# Patient Record
Sex: Male | Born: 1935
Health system: Southern US, Community
[De-identification: ages and names within clinical notes are randomized; demographics above are authoritative.]

## PROBLEM LIST (undated history)

## (undated) DIAGNOSIS — D696 Thrombocytopenia, unspecified: Secondary | ICD-10-CM

## (undated) DIAGNOSIS — C61 Malignant neoplasm of prostate: Secondary | ICD-10-CM

## (undated) DIAGNOSIS — J189 Pneumonia, unspecified organism: Secondary | ICD-10-CM

## (undated) DIAGNOSIS — R011 Cardiac murmur, unspecified: Secondary | ICD-10-CM

## (undated) DIAGNOSIS — I499 Cardiac arrhythmia, unspecified: Secondary | ICD-10-CM

## (undated) DIAGNOSIS — R001 Bradycardia, unspecified: Secondary | ICD-10-CM

## (undated) DIAGNOSIS — R799 Abnormal finding of blood chemistry, unspecified: Secondary | ICD-10-CM

## (undated) DIAGNOSIS — C439 Malignant melanoma of skin, unspecified: Secondary | ICD-10-CM

## (undated) HISTORY — DX: Cardiac murmur, unspecified: R01.1

## (undated) HISTORY — PX: CATARACT EXTRACTION: SUR2

## (undated) HISTORY — PX: BACK SURGERY: SHX140

## (undated) HISTORY — PX: KNEE SURGERY: SHX244

## (undated) HISTORY — PX: MELANOMA EXCISION: SHX5266

## (undated) HISTORY — DX: Abnormal finding of blood chemistry, unspecified: R79.9

---

## 2006-12-25 ENCOUNTER — Encounter: Admission: RE | Admit: 2006-12-25 | Discharge: 2006-12-25 | Payer: Self-pay | Admitting: Family Medicine

## 2008-01-23 ENCOUNTER — Encounter: Admission: RE | Admit: 2008-01-23 | Discharge: 2008-03-27 | Payer: Self-pay | Admitting: Specialist

## 2008-03-28 ENCOUNTER — Encounter: Admission: RE | Admit: 2008-03-28 | Discharge: 2008-06-26 | Payer: Self-pay | Admitting: Specialist

## 2009-07-20 ENCOUNTER — Ambulatory Visit (HOSPITAL_COMMUNITY): Admission: RE | Admit: 2009-07-20 | Discharge: 2009-07-20 | Payer: Self-pay | Admitting: Urology

## 2010-02-01 ENCOUNTER — Encounter
Admission: RE | Admit: 2010-02-01 | Discharge: 2010-03-25 | Payer: Self-pay | Source: Home / Self Care | Attending: Internal Medicine | Admitting: Internal Medicine

## 2011-04-20 DIAGNOSIS — R972 Elevated prostate specific antigen [PSA]: Secondary | ICD-10-CM | POA: Diagnosis not present

## 2011-04-20 DIAGNOSIS — C61 Malignant neoplasm of prostate: Secondary | ICD-10-CM | POA: Diagnosis not present

## 2011-04-20 DIAGNOSIS — N529 Male erectile dysfunction, unspecified: Secondary | ICD-10-CM | POA: Diagnosis not present

## 2011-04-20 DIAGNOSIS — N401 Enlarged prostate with lower urinary tract symptoms: Secondary | ICD-10-CM | POA: Diagnosis not present

## 2011-08-02 DIAGNOSIS — M545 Low back pain: Secondary | ICD-10-CM | POA: Diagnosis not present

## 2011-08-02 DIAGNOSIS — IMO0002 Reserved for concepts with insufficient information to code with codable children: Secondary | ICD-10-CM | POA: Diagnosis not present

## 2011-08-02 DIAGNOSIS — M48061 Spinal stenosis, lumbar region without neurogenic claudication: Secondary | ICD-10-CM | POA: Diagnosis not present

## 2011-08-18 DIAGNOSIS — IMO0002 Reserved for concepts with insufficient information to code with codable children: Secondary | ICD-10-CM | POA: Diagnosis not present

## 2011-08-18 DIAGNOSIS — M545 Low back pain: Secondary | ICD-10-CM | POA: Diagnosis not present

## 2011-08-18 DIAGNOSIS — M48061 Spinal stenosis, lumbar region without neurogenic claudication: Secondary | ICD-10-CM | POA: Diagnosis not present

## 2011-09-08 DIAGNOSIS — M545 Low back pain: Secondary | ICD-10-CM | POA: Diagnosis not present

## 2011-09-08 DIAGNOSIS — IMO0002 Reserved for concepts with insufficient information to code with codable children: Secondary | ICD-10-CM | POA: Diagnosis not present

## 2011-09-08 DIAGNOSIS — M48061 Spinal stenosis, lumbar region without neurogenic claudication: Secondary | ICD-10-CM | POA: Diagnosis not present

## 2011-09-13 DIAGNOSIS — E1365 Other specified diabetes mellitus with hyperglycemia: Secondary | ICD-10-CM | POA: Diagnosis not present

## 2011-09-13 DIAGNOSIS — I10 Essential (primary) hypertension: Secondary | ICD-10-CM | POA: Diagnosis not present

## 2011-09-16 DIAGNOSIS — M79609 Pain in unspecified limb: Secondary | ICD-10-CM | POA: Diagnosis not present

## 2011-09-16 DIAGNOSIS — R209 Unspecified disturbances of skin sensation: Secondary | ICD-10-CM | POA: Diagnosis not present

## 2011-09-21 DIAGNOSIS — IMO0002 Reserved for concepts with insufficient information to code with codable children: Secondary | ICD-10-CM | POA: Diagnosis not present

## 2011-09-21 DIAGNOSIS — M48061 Spinal stenosis, lumbar region without neurogenic claudication: Secondary | ICD-10-CM | POA: Diagnosis not present

## 2011-10-12 DIAGNOSIS — C61 Malignant neoplasm of prostate: Secondary | ICD-10-CM | POA: Diagnosis not present

## 2011-10-19 DIAGNOSIS — IMO0002 Reserved for concepts with insufficient information to code with codable children: Secondary | ICD-10-CM | POA: Diagnosis not present

## 2011-10-19 DIAGNOSIS — C61 Malignant neoplasm of prostate: Secondary | ICD-10-CM | POA: Diagnosis not present

## 2011-10-19 DIAGNOSIS — M48061 Spinal stenosis, lumbar region without neurogenic claudication: Secondary | ICD-10-CM | POA: Diagnosis not present

## 2011-10-19 DIAGNOSIS — R972 Elevated prostate specific antigen [PSA]: Secondary | ICD-10-CM | POA: Diagnosis not present

## 2011-10-19 DIAGNOSIS — N401 Enlarged prostate with lower urinary tract symptoms: Secondary | ICD-10-CM | POA: Diagnosis not present

## 2011-10-26 ENCOUNTER — Other Ambulatory Visit (HOSPITAL_COMMUNITY): Payer: Self-pay | Admitting: Specialist

## 2011-10-26 DIAGNOSIS — M545 Low back pain: Secondary | ICD-10-CM

## 2011-10-27 ENCOUNTER — Ambulatory Visit (HOSPITAL_COMMUNITY)
Admission: RE | Admit: 2011-10-27 | Discharge: 2011-10-27 | Disposition: A | Discharge status: Home / Self Care | Payer: Medicare Other | Source: Ambulatory Visit | Attending: Specialist | Admitting: Specialist

## 2011-10-27 DIAGNOSIS — M545 Low back pain, unspecified: Secondary | ICD-10-CM | POA: Insufficient documentation

## 2011-10-27 DIAGNOSIS — M79609 Pain in unspecified limb: Secondary | ICD-10-CM | POA: Diagnosis not present

## 2011-10-27 DIAGNOSIS — C61 Malignant neoplasm of prostate: Secondary | ICD-10-CM | POA: Insufficient documentation

## 2011-10-27 DIAGNOSIS — M48061 Spinal stenosis, lumbar region without neurogenic claudication: Secondary | ICD-10-CM | POA: Diagnosis not present

## 2011-10-28 ENCOUNTER — Other Ambulatory Visit (HOSPITAL_COMMUNITY): Payer: Self-pay

## 2011-11-08 DIAGNOSIS — M48061 Spinal stenosis, lumbar region without neurogenic claudication: Secondary | ICD-10-CM | POA: Diagnosis not present

## 2012-01-02 DIAGNOSIS — M48061 Spinal stenosis, lumbar region without neurogenic claudication: Secondary | ICD-10-CM | POA: Diagnosis not present

## 2012-01-02 DIAGNOSIS — M545 Low back pain: Secondary | ICD-10-CM | POA: Diagnosis not present

## 2012-01-02 DIAGNOSIS — IMO0002 Reserved for concepts with insufficient information to code with codable children: Secondary | ICD-10-CM | POA: Diagnosis not present

## 2012-02-28 DIAGNOSIS — W010XXA Fall on same level from slipping, tripping and stumbling without subsequent striking against object, initial encounter: Secondary | ICD-10-CM | POA: Diagnosis not present

## 2012-02-28 DIAGNOSIS — S61409A Unspecified open wound of unspecified hand, initial encounter: Secondary | ICD-10-CM | POA: Diagnosis not present

## 2012-03-05 DIAGNOSIS — Z23 Encounter for immunization: Secondary | ICD-10-CM | POA: Diagnosis not present

## 2012-03-05 DIAGNOSIS — S61409A Unspecified open wound of unspecified hand, initial encounter: Secondary | ICD-10-CM | POA: Diagnosis not present

## 2012-03-08 DIAGNOSIS — E1365 Other specified diabetes mellitus with hyperglycemia: Secondary | ICD-10-CM | POA: Diagnosis not present

## 2012-03-08 DIAGNOSIS — Z125 Encounter for screening for malignant neoplasm of prostate: Secondary | ICD-10-CM | POA: Diagnosis not present

## 2012-03-08 DIAGNOSIS — I10 Essential (primary) hypertension: Secondary | ICD-10-CM | POA: Diagnosis not present

## 2012-03-14 DIAGNOSIS — D126 Benign neoplasm of colon, unspecified: Secondary | ICD-10-CM | POA: Diagnosis not present

## 2012-03-14 DIAGNOSIS — I10 Essential (primary) hypertension: Secondary | ICD-10-CM | POA: Diagnosis not present

## 2012-03-14 DIAGNOSIS — E1365 Other specified diabetes mellitus with hyperglycemia: Secondary | ICD-10-CM | POA: Diagnosis not present

## 2012-03-14 DIAGNOSIS — C61 Malignant neoplasm of prostate: Secondary | ICD-10-CM | POA: Diagnosis not present

## 2012-04-04 DIAGNOSIS — M48061 Spinal stenosis, lumbar region without neurogenic claudication: Secondary | ICD-10-CM | POA: Diagnosis not present

## 2012-04-10 ENCOUNTER — Ambulatory Visit: Payer: Medicare Other | Attending: Specialist | Admitting: Physical Therapy

## 2012-04-10 DIAGNOSIS — Z96659 Presence of unspecified artificial knee joint: Secondary | ICD-10-CM | POA: Insufficient documentation

## 2012-04-10 DIAGNOSIS — IMO0001 Reserved for inherently not codable concepts without codable children: Secondary | ICD-10-CM | POA: Diagnosis not present

## 2012-04-10 DIAGNOSIS — Z9181 History of falling: Secondary | ICD-10-CM | POA: Diagnosis not present

## 2012-04-10 DIAGNOSIS — R5381 Other malaise: Secondary | ICD-10-CM | POA: Diagnosis not present

## 2012-04-11 ENCOUNTER — Ambulatory Visit: Payer: Medicare Other | Admitting: Physical Therapy

## 2012-04-11 DIAGNOSIS — IMO0001 Reserved for inherently not codable concepts without codable children: Secondary | ICD-10-CM | POA: Diagnosis not present

## 2012-04-11 DIAGNOSIS — Z96659 Presence of unspecified artificial knee joint: Secondary | ICD-10-CM | POA: Diagnosis not present

## 2012-04-11 DIAGNOSIS — Z9181 History of falling: Secondary | ICD-10-CM | POA: Diagnosis not present

## 2012-04-11 DIAGNOSIS — R5381 Other malaise: Secondary | ICD-10-CM | POA: Diagnosis not present

## 2012-04-16 ENCOUNTER — Ambulatory Visit: Payer: Medicare Other | Admitting: *Deleted

## 2012-04-16 DIAGNOSIS — R5381 Other malaise: Secondary | ICD-10-CM | POA: Diagnosis not present

## 2012-04-16 DIAGNOSIS — Z96659 Presence of unspecified artificial knee joint: Secondary | ICD-10-CM | POA: Diagnosis not present

## 2012-04-16 DIAGNOSIS — Z9181 History of falling: Secondary | ICD-10-CM | POA: Diagnosis not present

## 2012-04-16 DIAGNOSIS — IMO0001 Reserved for inherently not codable concepts without codable children: Secondary | ICD-10-CM | POA: Diagnosis not present

## 2012-04-18 ENCOUNTER — Ambulatory Visit: Payer: Medicare Other | Admitting: *Deleted

## 2012-04-18 DIAGNOSIS — Z96659 Presence of unspecified artificial knee joint: Secondary | ICD-10-CM | POA: Diagnosis not present

## 2012-04-18 DIAGNOSIS — C61 Malignant neoplasm of prostate: Secondary | ICD-10-CM | POA: Diagnosis not present

## 2012-04-18 DIAGNOSIS — R5381 Other malaise: Secondary | ICD-10-CM | POA: Diagnosis not present

## 2012-04-18 DIAGNOSIS — Z9181 History of falling: Secondary | ICD-10-CM | POA: Diagnosis not present

## 2012-04-18 DIAGNOSIS — IMO0001 Reserved for inherently not codable concepts without codable children: Secondary | ICD-10-CM | POA: Diagnosis not present

## 2012-04-23 ENCOUNTER — Ambulatory Visit: Payer: Medicare Other | Admitting: Physical Therapy

## 2012-04-23 DIAGNOSIS — Z9181 History of falling: Secondary | ICD-10-CM | POA: Diagnosis not present

## 2012-04-23 DIAGNOSIS — Z96659 Presence of unspecified artificial knee joint: Secondary | ICD-10-CM | POA: Diagnosis not present

## 2012-04-23 DIAGNOSIS — IMO0001 Reserved for inherently not codable concepts without codable children: Secondary | ICD-10-CM | POA: Diagnosis not present

## 2012-04-23 DIAGNOSIS — R5381 Other malaise: Secondary | ICD-10-CM | POA: Diagnosis not present

## 2012-04-25 ENCOUNTER — Encounter: Payer: Medicare Other | Admitting: Physical Therapy

## 2012-04-30 ENCOUNTER — Ambulatory Visit: Payer: Medicare Other | Attending: Specialist | Admitting: *Deleted

## 2012-04-30 DIAGNOSIS — R5381 Other malaise: Secondary | ICD-10-CM | POA: Diagnosis not present

## 2012-04-30 DIAGNOSIS — Z96659 Presence of unspecified artificial knee joint: Secondary | ICD-10-CM | POA: Diagnosis not present

## 2012-04-30 DIAGNOSIS — IMO0001 Reserved for inherently not codable concepts without codable children: Secondary | ICD-10-CM | POA: Diagnosis not present

## 2012-04-30 DIAGNOSIS — Z9181 History of falling: Secondary | ICD-10-CM | POA: Diagnosis not present

## 2012-05-02 ENCOUNTER — Ambulatory Visit: Payer: Medicare Other | Admitting: *Deleted

## 2012-05-07 ENCOUNTER — Ambulatory Visit: Payer: Medicare Other | Admitting: *Deleted

## 2012-05-07 DIAGNOSIS — N401 Enlarged prostate with lower urinary tract symptoms: Secondary | ICD-10-CM | POA: Diagnosis not present

## 2012-05-07 DIAGNOSIS — R972 Elevated prostate specific antigen [PSA]: Secondary | ICD-10-CM | POA: Diagnosis not present

## 2012-05-07 DIAGNOSIS — C61 Malignant neoplasm of prostate: Secondary | ICD-10-CM | POA: Diagnosis not present

## 2012-05-07 DIAGNOSIS — N529 Male erectile dysfunction, unspecified: Secondary | ICD-10-CM | POA: Diagnosis not present

## 2012-05-09 ENCOUNTER — Ambulatory Visit: Payer: Medicare Other | Admitting: *Deleted

## 2012-05-15 ENCOUNTER — Ambulatory Visit: Payer: Medicare Other | Admitting: *Deleted

## 2012-05-17 ENCOUNTER — Ambulatory Visit: Payer: Medicare Other | Admitting: *Deleted

## 2012-05-23 ENCOUNTER — Ambulatory Visit: Payer: Medicare Other | Admitting: *Deleted

## 2012-06-26 DIAGNOSIS — H524 Presbyopia: Secondary | ICD-10-CM | POA: Diagnosis not present

## 2012-06-26 DIAGNOSIS — H2510 Age-related nuclear cataract, unspecified eye: Secondary | ICD-10-CM | POA: Diagnosis not present

## 2012-06-26 DIAGNOSIS — H35369 Drusen (degenerative) of macula, unspecified eye: Secondary | ICD-10-CM | POA: Diagnosis not present

## 2012-06-26 DIAGNOSIS — H1045 Other chronic allergic conjunctivitis: Secondary | ICD-10-CM | POA: Diagnosis not present

## 2012-07-09 DIAGNOSIS — H353190 Nonexudative age-related macular degeneration, unspecified eye, stage unspecified: Secondary | ICD-10-CM | POA: Diagnosis not present

## 2012-07-09 DIAGNOSIS — H2510 Age-related nuclear cataract, unspecified eye: Secondary | ICD-10-CM | POA: Diagnosis not present

## 2012-07-09 DIAGNOSIS — H43819 Vitreous degeneration, unspecified eye: Secondary | ICD-10-CM | POA: Diagnosis not present

## 2012-07-09 DIAGNOSIS — H43399 Other vitreous opacities, unspecified eye: Secondary | ICD-10-CM | POA: Diagnosis not present

## 2012-08-01 DIAGNOSIS — M48061 Spinal stenosis, lumbar region without neurogenic claudication: Secondary | ICD-10-CM | POA: Diagnosis not present

## 2012-09-12 DIAGNOSIS — E1365 Other specified diabetes mellitus with hyperglycemia: Secondary | ICD-10-CM | POA: Diagnosis not present

## 2012-09-12 DIAGNOSIS — I10 Essential (primary) hypertension: Secondary | ICD-10-CM | POA: Diagnosis not present

## 2012-09-18 DIAGNOSIS — E1365 Other specified diabetes mellitus with hyperglycemia: Secondary | ICD-10-CM | POA: Diagnosis not present

## 2012-09-18 DIAGNOSIS — I10 Essential (primary) hypertension: Secondary | ICD-10-CM | POA: Diagnosis not present

## 2012-09-18 DIAGNOSIS — C61 Malignant neoplasm of prostate: Secondary | ICD-10-CM | POA: Diagnosis not present

## 2012-10-29 DIAGNOSIS — C61 Malignant neoplasm of prostate: Secondary | ICD-10-CM | POA: Diagnosis not present

## 2012-11-05 DIAGNOSIS — N401 Enlarged prostate with lower urinary tract symptoms: Secondary | ICD-10-CM | POA: Diagnosis not present

## 2012-11-05 DIAGNOSIS — C61 Malignant neoplasm of prostate: Secondary | ICD-10-CM | POA: Diagnosis not present

## 2012-11-05 DIAGNOSIS — R972 Elevated prostate specific antigen [PSA]: Secondary | ICD-10-CM | POA: Diagnosis not present

## 2013-01-03 DIAGNOSIS — Z23 Encounter for immunization: Secondary | ICD-10-CM | POA: Diagnosis not present

## 2013-02-04 DIAGNOSIS — C61 Malignant neoplasm of prostate: Secondary | ICD-10-CM | POA: Diagnosis not present

## 2013-03-03 ENCOUNTER — Encounter (HOSPITAL_COMMUNITY): Payer: Self-pay | Admitting: Emergency Medicine

## 2013-03-03 ENCOUNTER — Emergency Department (HOSPITAL_COMMUNITY)
Admission: EM | Admit: 2013-03-03 | Discharge: 2013-03-03 | Disposition: A | Discharge status: Home / Self Care | Payer: Medicare Other | Attending: Emergency Medicine | Admitting: Emergency Medicine

## 2013-03-03 DIAGNOSIS — Z79899 Other long term (current) drug therapy: Secondary | ICD-10-CM | POA: Insufficient documentation

## 2013-03-03 DIAGNOSIS — R42 Dizziness and giddiness: Secondary | ICD-10-CM | POA: Insufficient documentation

## 2013-03-03 DIAGNOSIS — Z85820 Personal history of malignant melanoma of skin: Secondary | ICD-10-CM | POA: Insufficient documentation

## 2013-03-03 DIAGNOSIS — I498 Other specified cardiac arrhythmias: Secondary | ICD-10-CM | POA: Diagnosis not present

## 2013-03-03 DIAGNOSIS — Z7982 Long term (current) use of aspirin: Secondary | ICD-10-CM | POA: Diagnosis not present

## 2013-03-03 DIAGNOSIS — M542 Cervicalgia: Secondary | ICD-10-CM | POA: Insufficient documentation

## 2013-03-03 DIAGNOSIS — J3489 Other specified disorders of nose and nasal sinuses: Secondary | ICD-10-CM | POA: Insufficient documentation

## 2013-03-03 HISTORY — DX: Cardiac arrhythmia, unspecified: I49.9

## 2013-03-03 HISTORY — DX: Malignant melanoma of skin, unspecified: C43.9

## 2013-03-03 LAB — CBC WITH DIFFERENTIAL/PLATELET
Basophils Absolute: 0 10*3/uL (ref 0.0–0.1)
Eosinophils Relative: 1 % (ref 0–5)
Lymphocytes Relative: 20 % (ref 12–46)
MCHC: 33.7 g/dL (ref 30.0–36.0)
Neutro Abs: 4.6 10*3/uL (ref 1.7–7.7)
Neutrophils Relative %: 71 % (ref 43–77)
RBC: 3.93 MIL/uL — ABNORMAL LOW (ref 4.22–5.81)
WBC: 6.5 10*3/uL (ref 4.0–10.5)

## 2013-03-03 LAB — COMPREHENSIVE METABOLIC PANEL
ALT: 22 U/L (ref 0–53)
Albumin: 3.8 g/dL (ref 3.5–5.2)
Chloride: 101 mEq/L (ref 96–112)
Glucose, Bld: 113 mg/dL — ABNORMAL HIGH (ref 70–99)
Potassium: 4.3 mEq/L (ref 3.5–5.1)
Total Protein: 7.2 g/dL (ref 6.0–8.3)

## 2013-03-03 LAB — URINALYSIS, ROUTINE W REFLEX MICROSCOPIC
Bilirubin Urine: NEGATIVE
Glucose, UA: NEGATIVE mg/dL
Nitrite: NEGATIVE
Protein, ur: NEGATIVE mg/dL
Specific Gravity, Urine: 1.013 (ref 1.005–1.030)

## 2013-03-03 LAB — POCT I-STAT TROPONIN I

## 2013-03-03 MED ORDER — SODIUM CHLORIDE 0.9 % IV SOLN
Freq: Once | INTRAVENOUS | Status: AC
  Administered 2013-03-03: 1000 mL via INTRAVENOUS

## 2013-03-03 NOTE — ED Notes (Signed)
[facility] Medical Records called back. Stated that they have no medical records for the patient.

## 2013-03-03 NOTE — ED Provider Notes (Signed)
79 year old male, states that he did not have his normal morning coffee this morning, went to church and while he was at church she started to feel lightheaded and nauseated, placed his head between his knees and felt immediately better but when he sat up again it came back. This is happened several times throughout the morning, he denies any chest pain, shortness of breath, changes in vision, numbness, weakness, ataxia. At this time his symptoms have essentially resolved. His EKG shows no acute findings, lab work is normal, troponin is normal. He will have a second troponin prior to being discharged home. The patient has no focal neurologic deficits to suggest a stroke, no cardiac abnormalities to suggest acute ischemia or arrhythmia. Cardiac monitoring has been performed without any signs of concern.  Medical screening examination/treatment/procedure(s) were conducted as a shared visit with non-physician practitioner(s) and myself.  I personally evaluated the patient during the encounter.  Clinical Impression: light headed      Vida Roller, MD 03/05/13 [phone]

## 2013-03-03 NOTE — ED Notes (Addendum)
Patient presents to ED via EMS from Urgent Care in [place] for dizziness at church today.

## 2013-03-03 NOTE — ED Provider Notes (Signed)
CSN: [id number]     Arrival date & time 03/03/13  1414 History   First MD Initiated Contact with Patient 03/03/13 1502     Chief Complaint  Patient presents with  . Dizziness   (Consider location/radiation/quality/duration/timing/severity/associated sxs/prior Treatment) The history is provided by the patient.   Pt reports that for several years he has had occasional episodes of lightheadedness that resolve when he puts his head between his legs.  States his blood pressure usually measures low during these episodes ("75/50").  Has seen Dr Jacinto Halim for same with no known diagnosis. Today he was sitting in church (around 11:30am) and had an episode that was worse than prior episodes and associated with nausea.  States he felt better when looking towards dark areas and with his head between his legs, worse with standing up.  Significant other states he was jerking all over.  He had to be assisted while walking.  Taken to urgent care where he was found to be bradycardic in low 50s, states his normal HR is in the 70s.  Pt has also noted soreness in the back of his neck today, which he thinks is from moving furniture yesterday.  Pt has been on medications for high blood pressure including lisinopril but states he is only taking multivitamins at this time.  Denies recent illness, chest pain, SOB, leg swelling, fever/chills, new or change in medications recently.  States he is eating and drinking well.   Past Medical History  Diagnosis Date  . Irregular heart rate   . Melanoma    Past Surgical History  Procedure Laterality Date  . Back surgery    . Knee surgery     History reviewed. No pertinent family history. History  Substance Use Topics  . Smoking status: Never Smoker   . Smokeless tobacco: Not on file  . Alcohol Use: Not on file    Review of Systems  Constitutional: Negative for fever, chills, activity change, appetite change and fatigue.  HENT: Positive for congestion. Negative for sore  throat.   Respiratory: Negative for cough and shortness of breath.   Cardiovascular: Negative for chest pain and leg swelling.  Gastrointestinal: Negative for nausea, vomiting, abdominal pain and diarrhea.  Musculoskeletal: Positive for neck pain. Negative for neck stiffness.  Neurological: Positive for light-headedness. Negative for dizziness, syncope, speech difficulty, weakness and numbness.  Psychiatric/Behavioral: Negative for confusion.  All other systems reviewed and are negative.    Allergies  Review of patient's allergies indicates no known allergies.  Home Medications   Current Outpatient Rx  Name  Route  Sig  Dispense  Refill  . doxazosin (CARDURA) 2 MG tablet   Oral   Take 1 mg by mouth daily.         Marland Kitchen lisinopril (PRINIVIL,ZESTRIL) 5 MG tablet   Oral   Take 2.5 mg by mouth daily.         . Multiple Vitamin (MULTIVITAMIN WITH MINERALS) TABS tablet   Oral   Take 1 tablet by mouth daily.         Marland Kitchen OVER THE COUNTER MEDICATION   Oral   Take 1 tablet by mouth daily as needed (antihistamine. Allergies.).          Marland Kitchen sodium chloride (OCEAN) 0.65 % nasal spray   Nasal   Place 2 sprays into the nose 2 (two) times daily as needed for congestion.         Marland Kitchen aspirin EC 81 MG tablet   Oral  Take 81 mg by mouth daily.          BP 168/59  Pulse 50  Temp(Src) 97.5 F (36.4 C) (Oral)  Resp 14  Ht 6\' 3"  (1.905 m)  Wt 210 lb (95.255 kg)  BMI 26.25 kg/m2  SpO2 99% Physical Exam  Nursing note and vitals reviewed. Constitutional: He appears well-developed and well-nourished. No distress.  HENT:  Head: Normocephalic and atraumatic.  Mouth/Throat: No oropharyngeal exudate.  Neck: Normal range of motion. Neck supple.  Cardiovascular: Normal rate and regular rhythm.   Pulmonary/Chest: Effort normal and breath sounds normal. No stridor. No respiratory distress. He has no wheezes. He has no rales.  Abdominal: Soft. He exhibits no distension and no mass. There  is no tenderness. There is no rebound and no guarding.  Musculoskeletal:       Cervical back: Normal. He exhibits normal range of motion, no tenderness and no bony tenderness.  Neurological: He is alert. He has normal strength. No cranial nerve deficit or sensory deficit. He exhibits normal muscle tone. Coordination and gait normal. GCS eye subscore is 4. GCS verbal subscore is 5. GCS motor subscore is 6.  CN II-XII intact, EOMs intact, no pronator drift, grip strengths equal bilaterally; strength 5/5 in all extremities, sensation intact in all extremities; finger to nose, heel to shin, rapid alternating movements normal; gait is normal.     Skin: He is not diaphoretic.    ED Course  Procedures (including critical care time) Labs Review Labs Reviewed  CBC WITH DIFFERENTIAL - Abnormal; Notable for the following:    RBC 3.93 (*)    Hemoglobin 12.5 (*)    HCT 37.1 (*)    All other components within normal limits  COMPREHENSIVE METABOLIC PANEL - Abnormal; Notable for the following:    Glucose, Bld 113 (*)    GFR calc non Af Amer 84 (*)    All other components within normal limits  URINALYSIS, ROUTINE W REFLEX MICROSCOPIC  POCT I-STAT TROPONIN I  POCT I-STAT TROPONIN I   Imaging Review No results found.  EKG Interpretation    Date/Time:  Sunday March 03 2013 14:26:01 EST Ventricular Rate:  50 PR Interval:  184 QRS Duration: 85 QT Interval:  441 QTC Calculation: 402 R Axis:   48 Text Interpretation:  Sinus Bradycardia Atrial premature complexes Consider left atrial enlargement Abnormal R-wave progression, early transition Borderline ST elevation, inferior leads Confirmed by MILLER  MD, BRIAN ([id number]) on 03/03/2013 4:51:52 PM           4:08 PM Discussed patient, workup, and plan with Dr Hyacinth Meeker.  Will repeat troponin and EKG at 3 hrs.   5:57 PM Patient reporting he is asymptomatic presently.   6:57 PM Repeat EKG is unchanged.  Reviewed with Dr Hyacinth Meeker.   MDM   1.  Lightheadedness      Pt with hx recurrent episodes of lightheadedness p/w episode of lightheadedness today that was different from his normal episode in that he was nauseated and it lasted longer.  EKG reviewed with Dr Hyacinth Meeker- shows sinus bradycardia. Pt has hx arhythmia.  Neurologically intact.  Troponin x 2 negative.  Pt with mild anemia, labs otherwise unremarkable.  Repeat EKG is unchanged. Not orthostatic.  Pt feeling much improved while in ED.  D/C home with PCP follow up.  Discussed result, findings, treatment, and follow up  with patient.  Pt given return precautions.  Pt verbalizes understanding and agrees with plan.  Trixie Dredge, PA-C 03/03/13 2003

## 2013-03-05 ENCOUNTER — Other Ambulatory Visit: Payer: Self-pay | Admitting: Internal Medicine

## 2013-03-05 DIAGNOSIS — R55 Syncope and collapse: Secondary | ICD-10-CM

## 2013-03-05 DIAGNOSIS — I10 Essential (primary) hypertension: Secondary | ICD-10-CM | POA: Diagnosis not present

## 2013-03-05 NOTE — ED Provider Notes (Signed)
79 year old male, states that he did not have his normal morning coffee this morning, went to church and while he was at church she started to feel lightheaded and nauseated, placed his head between his knees and felt immediately better but when he sat up again it came back. This is happened several times throughout the morning, he denies any chest pain, shortness of breath, changes in vision, numbness, weakness, ataxia. At this time his symptoms have essentially resolved. His EKG shows no acute findings, lab work is normal, troponin is normal. He will have a second troponin prior to being discharged home. The patient has no focal neurologic deficits to suggest a stroke, no cardiac abnormalities to suggest acute ischemia or arrhythmia. Cardiac monitoring has been performed without any signs of concern.   Medical screening examination/treatment/procedure(s) were conducted as a shared visit with non-physician practitioner(s) and myself. I personally evaluated the patient during the encounter.   Clinical Impression: light headed   Vida Roller, MD 03/05/13 [phone]

## 2013-03-13 DIAGNOSIS — C61 Malignant neoplasm of prostate: Secondary | ICD-10-CM | POA: Diagnosis not present

## 2013-04-01 DIAGNOSIS — IMO0002 Reserved for concepts with insufficient information to code with codable children: Secondary | ICD-10-CM | POA: Diagnosis not present

## 2013-04-01 DIAGNOSIS — E1365 Other specified diabetes mellitus with hyperglycemia: Secondary | ICD-10-CM | POA: Diagnosis not present

## 2013-04-01 DIAGNOSIS — I10 Essential (primary) hypertension: Secondary | ICD-10-CM | POA: Diagnosis not present

## 2013-04-04 ENCOUNTER — Ambulatory Visit
Admission: RE | Admit: 2013-04-04 | Discharge: 2013-04-04 | Disposition: A | Discharge status: Home / Self Care | Payer: Medicare Other | Source: Ambulatory Visit | Attending: Internal Medicine | Admitting: Internal Medicine

## 2013-04-04 DIAGNOSIS — I658 Occlusion and stenosis of other precerebral arteries: Secondary | ICD-10-CM | POA: Diagnosis not present

## 2013-04-04 DIAGNOSIS — R55 Syncope and collapse: Secondary | ICD-10-CM

## 2013-04-08 DIAGNOSIS — I10 Essential (primary) hypertension: Secondary | ICD-10-CM | POA: Diagnosis not present

## 2013-04-08 DIAGNOSIS — C61 Malignant neoplasm of prostate: Secondary | ICD-10-CM | POA: Diagnosis not present

## 2013-04-08 DIAGNOSIS — R55 Syncope and collapse: Secondary | ICD-10-CM | POA: Diagnosis not present

## 2013-04-08 DIAGNOSIS — R7309 Other abnormal glucose: Secondary | ICD-10-CM | POA: Diagnosis not present

## 2013-04-09 DIAGNOSIS — I10 Essential (primary) hypertension: Secondary | ICD-10-CM | POA: Diagnosis not present

## 2013-04-09 DIAGNOSIS — R55 Syncope and collapse: Secondary | ICD-10-CM | POA: Diagnosis not present

## 2013-04-09 DIAGNOSIS — R42 Dizziness and giddiness: Secondary | ICD-10-CM | POA: Diagnosis not present

## 2013-04-16 DIAGNOSIS — R42 Dizziness and giddiness: Secondary | ICD-10-CM | POA: Diagnosis not present

## 2013-04-24 DIAGNOSIS — I359 Nonrheumatic aortic valve disorder, unspecified: Secondary | ICD-10-CM | POA: Diagnosis not present

## 2013-04-24 DIAGNOSIS — R55 Syncope and collapse: Secondary | ICD-10-CM | POA: Diagnosis not present

## 2013-04-29 DIAGNOSIS — R55 Syncope and collapse: Secondary | ICD-10-CM | POA: Diagnosis not present

## 2013-05-09 DIAGNOSIS — C61 Malignant neoplasm of prostate: Secondary | ICD-10-CM | POA: Diagnosis not present

## 2013-06-07 DIAGNOSIS — R55 Syncope and collapse: Secondary | ICD-10-CM | POA: Diagnosis not present

## 2013-06-07 DIAGNOSIS — I10 Essential (primary) hypertension: Secondary | ICD-10-CM | POA: Diagnosis not present

## 2013-08-06 DIAGNOSIS — C61 Malignant neoplasm of prostate: Secondary | ICD-10-CM | POA: Diagnosis not present

## 2013-08-08 DIAGNOSIS — N401 Enlarged prostate with lower urinary tract symptoms: Secondary | ICD-10-CM | POA: Diagnosis not present

## 2013-08-08 DIAGNOSIS — N5314 Retrograde ejaculation: Secondary | ICD-10-CM | POA: Diagnosis not present

## 2013-08-08 DIAGNOSIS — C61 Malignant neoplasm of prostate: Secondary | ICD-10-CM | POA: Diagnosis not present

## 2013-08-08 DIAGNOSIS — N281 Cyst of kidney, acquired: Secondary | ICD-10-CM | POA: Diagnosis not present

## 2013-10-07 DIAGNOSIS — R7309 Other abnormal glucose: Secondary | ICD-10-CM | POA: Diagnosis not present

## 2013-10-07 DIAGNOSIS — I10 Essential (primary) hypertension: Secondary | ICD-10-CM | POA: Diagnosis not present

## 2013-10-10 DIAGNOSIS — R7309 Other abnormal glucose: Secondary | ICD-10-CM | POA: Diagnosis not present

## 2013-10-10 DIAGNOSIS — D126 Benign neoplasm of colon, unspecified: Secondary | ICD-10-CM | POA: Diagnosis not present

## 2013-10-10 DIAGNOSIS — I10 Essential (primary) hypertension: Secondary | ICD-10-CM | POA: Diagnosis not present

## 2013-10-10 DIAGNOSIS — D649 Anemia, unspecified: Secondary | ICD-10-CM | POA: Diagnosis not present

## 2013-10-23 DIAGNOSIS — H25049 Posterior subcapsular polar age-related cataract, unspecified eye: Secondary | ICD-10-CM | POA: Diagnosis not present

## 2013-10-23 DIAGNOSIS — H25019 Cortical age-related cataract, unspecified eye: Secondary | ICD-10-CM | POA: Diagnosis not present

## 2013-10-23 DIAGNOSIS — H353190 Nonexudative age-related macular degeneration, unspecified eye, stage unspecified: Secondary | ICD-10-CM | POA: Diagnosis not present

## 2013-10-23 DIAGNOSIS — H524 Presbyopia: Secondary | ICD-10-CM | POA: Diagnosis not present

## 2013-10-23 DIAGNOSIS — H2510 Age-related nuclear cataract, unspecified eye: Secondary | ICD-10-CM | POA: Diagnosis not present

## 2013-11-11 DIAGNOSIS — M48061 Spinal stenosis, lumbar region without neurogenic claudication: Secondary | ICD-10-CM | POA: Diagnosis not present

## 2013-11-13 DIAGNOSIS — D235 Other benign neoplasm of skin of trunk: Secondary | ICD-10-CM | POA: Diagnosis not present

## 2013-11-13 DIAGNOSIS — L819 Disorder of pigmentation, unspecified: Secondary | ICD-10-CM | POA: Diagnosis not present

## 2013-11-13 DIAGNOSIS — C44319 Basal cell carcinoma of skin of other parts of face: Secondary | ICD-10-CM | POA: Diagnosis not present

## 2013-11-13 DIAGNOSIS — D485 Neoplasm of uncertain behavior of skin: Secondary | ICD-10-CM | POA: Diagnosis not present

## 2013-11-13 DIAGNOSIS — L570 Actinic keratosis: Secondary | ICD-10-CM | POA: Diagnosis not present

## 2013-11-25 DIAGNOSIS — IMO0002 Reserved for concepts with insufficient information to code with codable children: Secondary | ICD-10-CM | POA: Diagnosis not present

## 2013-11-25 DIAGNOSIS — M48061 Spinal stenosis, lumbar region without neurogenic claudication: Secondary | ICD-10-CM | POA: Diagnosis not present

## 2013-11-26 DIAGNOSIS — H2510 Age-related nuclear cataract, unspecified eye: Secondary | ICD-10-CM | POA: Diagnosis not present

## 2013-12-17 DIAGNOSIS — C44111 Basal cell carcinoma of skin of unspecified eyelid, including canthus: Secondary | ICD-10-CM | POA: Diagnosis not present

## 2013-12-23 DIAGNOSIS — Z23 Encounter for immunization: Secondary | ICD-10-CM | POA: Diagnosis not present

## 2014-01-03 DIAGNOSIS — H25011 Cortical age-related cataract, right eye: Secondary | ICD-10-CM | POA: Diagnosis not present

## 2014-01-03 DIAGNOSIS — H2511 Age-related nuclear cataract, right eye: Secondary | ICD-10-CM | POA: Diagnosis not present

## 2014-01-08 DIAGNOSIS — M5440 Lumbago with sciatica, unspecified side: Secondary | ICD-10-CM | POA: Diagnosis not present

## 2014-01-08 DIAGNOSIS — M4806 Spinal stenosis, lumbar region: Secondary | ICD-10-CM | POA: Diagnosis not present

## 2014-01-15 DIAGNOSIS — M5440 Lumbago with sciatica, unspecified side: Secondary | ICD-10-CM | POA: Diagnosis not present

## 2014-01-15 DIAGNOSIS — M4806 Spinal stenosis, lumbar region: Secondary | ICD-10-CM | POA: Diagnosis not present

## 2014-02-04 DIAGNOSIS — H2511 Age-related nuclear cataract, right eye: Secondary | ICD-10-CM | POA: Diagnosis not present

## 2014-03-18 DIAGNOSIS — H9209 Otalgia, unspecified ear: Secondary | ICD-10-CM | POA: Diagnosis not present

## 2014-04-28 DIAGNOSIS — R739 Hyperglycemia, unspecified: Secondary | ICD-10-CM | POA: Diagnosis not present

## 2014-04-28 DIAGNOSIS — I10 Essential (primary) hypertension: Secondary | ICD-10-CM | POA: Diagnosis not present

## 2014-05-01 DIAGNOSIS — E559 Vitamin D deficiency, unspecified: Secondary | ICD-10-CM | POA: Diagnosis not present

## 2014-05-01 DIAGNOSIS — R739 Hyperglycemia, unspecified: Secondary | ICD-10-CM | POA: Diagnosis not present

## 2014-05-01 DIAGNOSIS — I10 Essential (primary) hypertension: Secondary | ICD-10-CM | POA: Diagnosis not present

## 2014-05-29 DIAGNOSIS — C61 Malignant neoplasm of prostate: Secondary | ICD-10-CM | POA: Diagnosis not present

## 2014-06-05 DIAGNOSIS — N401 Enlarged prostate with lower urinary tract symptoms: Secondary | ICD-10-CM | POA: Diagnosis not present

## 2014-06-05 DIAGNOSIS — R3913 Splitting of urinary stream: Secondary | ICD-10-CM | POA: Diagnosis not present

## 2014-06-05 DIAGNOSIS — R972 Elevated prostate specific antigen [PSA]: Secondary | ICD-10-CM | POA: Diagnosis not present

## 2014-06-05 DIAGNOSIS — C61 Malignant neoplasm of prostate: Secondary | ICD-10-CM | POA: Diagnosis not present

## 2014-06-28 ENCOUNTER — Inpatient Hospital Stay (HOSPITAL_COMMUNITY)
Admission: EM | Admit: 2014-06-28 | Discharge: 2014-07-01 | DRG: 194 | Disposition: A | Discharge status: Home / Self Care | Payer: Medicare Other | Attending: Internal Medicine | Admitting: Internal Medicine

## 2014-06-28 ENCOUNTER — Encounter (HOSPITAL_COMMUNITY): Payer: Self-pay | Admitting: Emergency Medicine

## 2014-06-28 ENCOUNTER — Emergency Department (HOSPITAL_COMMUNITY): Payer: Medicare Other

## 2014-06-28 DIAGNOSIS — R509 Fever, unspecified: Secondary | ICD-10-CM | POA: Diagnosis not present

## 2014-06-28 DIAGNOSIS — D638 Anemia in other chronic diseases classified elsewhere: Secondary | ICD-10-CM | POA: Diagnosis present

## 2014-06-28 DIAGNOSIS — Z85820 Personal history of malignant melanoma of skin: Secondary | ICD-10-CM

## 2014-06-28 DIAGNOSIS — C61 Malignant neoplasm of prostate: Secondary | ICD-10-CM | POA: Diagnosis not present

## 2014-06-28 DIAGNOSIS — E871 Hypo-osmolality and hyponatremia: Secondary | ICD-10-CM | POA: Diagnosis not present

## 2014-06-28 DIAGNOSIS — D696 Thrombocytopenia, unspecified: Secondary | ICD-10-CM | POA: Diagnosis present

## 2014-06-28 DIAGNOSIS — R001 Bradycardia, unspecified: Secondary | ICD-10-CM | POA: Diagnosis not present

## 2014-06-28 DIAGNOSIS — Z7982 Long term (current) use of aspirin: Secondary | ICD-10-CM

## 2014-06-28 DIAGNOSIS — J189 Pneumonia, unspecified organism: Principal | ICD-10-CM | POA: Diagnosis present

## 2014-06-28 DIAGNOSIS — I10 Essential (primary) hypertension: Secondary | ICD-10-CM | POA: Diagnosis present

## 2014-06-28 DIAGNOSIS — E861 Hypovolemia: Secondary | ICD-10-CM | POA: Diagnosis not present

## 2014-06-28 DIAGNOSIS — R0902 Hypoxemia: Secondary | ICD-10-CM | POA: Diagnosis present

## 2014-06-28 HISTORY — DX: Malignant neoplasm of prostate: C61

## 2014-06-28 HISTORY — DX: Thrombocytopenia, unspecified: D69.6

## 2014-06-28 HISTORY — DX: Bradycardia, unspecified: R00.1

## 2014-06-28 HISTORY — DX: Pneumonia, unspecified organism: J18.9

## 2014-06-28 LAB — CBC WITH DIFFERENTIAL/PLATELET
BASOS ABS: 0 10*3/uL (ref 0.0–0.1)
BASOS PCT: 0 % (ref 0–1)
EOS ABS: 0 10*3/uL (ref 0.0–0.7)
Eosinophils Relative: 0 % (ref 0–5)
HEMATOCRIT: 34.1 % — AB (ref 39.0–52.0)
Hemoglobin: 11.2 g/dL — ABNORMAL LOW (ref 13.0–17.0)
Lymphocytes Relative: 6 % — ABNORMAL LOW (ref 12–46)
Lymphs Abs: 0.7 10*3/uL (ref 0.7–4.0)
MCH: 31.8 pg (ref 26.0–34.0)
MCHC: 32.8 g/dL (ref 30.0–36.0)
MCV: 96.9 fL (ref 78.0–100.0)
MONO ABS: 0.8 10*3/uL (ref 0.1–1.0)
MONOS PCT: 7 % (ref 3–12)
NEUTROS ABS: 10.4 10*3/uL — AB (ref 1.7–7.7)
Neutrophils Relative %: 87 % — ABNORMAL HIGH (ref 43–77)
PLATELETS: 133 10*3/uL — AB (ref 150–400)
RBC: 3.52 MIL/uL — AB (ref 4.22–5.81)
RDW: 13.2 % (ref 11.5–15.5)
WBC: 11.9 10*3/uL — ABNORMAL HIGH (ref 4.0–10.5)

## 2014-06-28 MED ORDER — ACETAMINOPHEN 325 MG PO TABS
650.0000 mg | ORAL_TABLET | Freq: Once | ORAL | Status: AC
  Administered 2014-06-28: 650 mg via ORAL
  Filled 2014-06-28: qty 2

## 2014-06-28 NOTE — ED Notes (Signed)
Patient states he had upper respiratory symptoms for a few days. Reports he started feeling bad today with body aches and malaise. States he had a temperature of 101.4 earlier.

## 2014-06-28 NOTE — ED Notes (Signed)
Patient also reports non-productive cough.

## 2014-06-28 NOTE — ED Provider Notes (Signed)
CSN: [id number]     Arrival date & time 06/28/14  2135 History  This chart was scribed for Thomas Fraise, MD by Edison Simon, ED Scribe. This patient was seen in room APA03/APA03 and the patient's care was started at 11:37 PM.    Chief Complaint  Patient presents with  . Generalized Body Aches  . Fever   Patient is a 81 y.o. male presenting with fever. The history is provided by the patient. No language interpreter was used.  Fever Max temp prior to arrival:  101 Severity:  Moderate Onset quality:  Gradual Duration:  1 day Timing:  Constant Progression:  Worsening Chronicity:  New Relieved by:  Aspirin Worsened by:  Nothing tried Ineffective treatments:  None tried Associated symptoms: chest pain, cough, myalgias and nausea   Associated symptoms: no diarrhea and no vomiting   Cough:    Severity:  Mild   Onset quality:  Gradual   Duration:  1 week   Timing:  Constant   Progression:  Improving   Chronicity:  New   HPI Comments: Fordyce Vega is a 81 y.o. male who presents to the Emergency Department complaining of cough with onset 1 week ago, with body aches and fever with onset today. He reports temperature was measured at 101, which then improved after ASA. He reports associated nausea, chest pain with cough, SOB, and abdominal discomfort. He has prostate cancer which is not being treated currently. He reports history of irregular heart rate. He denies history of DM but states his blood sugar tends to be high. He denies smoking or drinking. He denies vomiting, diarrhea, or neck pain.  Past Medical History  Diagnosis Date  . Irregular heart rate   . Melanoma   . Prostate cancer    Past Surgical History  Procedure Laterality Date  . Back surgery    . Knee surgery     History reviewed. No pertinent family history. History  Substance Use Topics  . Smoking status: Never Smoker   . Smokeless tobacco: Not on file  . Alcohol Use: No    Review of Systems  Constitutional:  Positive for fever.  Respiratory: Positive for cough and shortness of breath.   Cardiovascular: Positive for chest pain.  Gastrointestinal: Positive for nausea and abdominal pain. Negative for vomiting and diarrhea.  Musculoskeletal: Positive for myalgias. Negative for neck pain.  All other systems reviewed and are negative.     Allergies  Review of patient's allergies indicates no known allergies.  Home Medications   Prior to Admission medications   Medication Sig Start Date End Date Taking? Authorizing Provider  aspirin EC 81 MG tablet Take 81 mg by mouth daily.    Historical Provider, MD  doxazosin (CARDURA) 2 MG tablet Take 1 mg by mouth daily.    Historical Provider, MD  lisinopril (PRINIVIL,ZESTRIL) 5 MG tablet Take 2.5 mg by mouth daily.    Historical Provider, MD  Multiple Vitamin (MULTIVITAMIN WITH MINERALS) TABS tablet Take 1 tablet by mouth daily.    Historical Provider, MD  OVER THE COUNTER MEDICATION Take 1 tablet by mouth daily as needed (antihistamine. Allergies.).     Historical Provider, MD  sodium chloride (OCEAN) 0.65 % nasal spray Place 2 sprays into the nose 2 (two) times daily as needed for congestion.    Historical Provider, MD   BP 127/50 mmHg  Pulse 94  Temp(Src) 100.2 F (37.9 C) (Oral)  Resp 20  Ht 6' 3.5" (1.918 m)  Wt 210 lb (95.255  kg)  BMI 25.89 kg/m2  SpO2 94% Physical Exam  Nursing note and vitals reviewed.  CONSTITUTIONAL: Well developed/well nourished HEAD: Normocephalic/atraumatic EYES: EOMI ENMT: Mucous membranes moist NECK: supple no meningeal signs SPINE/BACK:entire spine nontender CV: S1/S2 noted, no murmurs/rubs/gallops noted LUNGS: no apparent distress, decreased breath sounds bialterally ABDOMEN: soft, nontender, no rebound or guarding, bowel sounds noted throughout abdomen GU:no cva tenderness NEURO: Pt is awake/alert/appropriate, moves all extremitiesx4.  No facial droop.   EXTREMITIES: pulses normal/equal, full ROM SKIN:  warm, color normal PSYCH: no abnormalities of mood noted, alert and oriented to situation  ED Course  Procedures   DIAGNOSTIC STUDIES: Oxygen Saturation is 94% on room air, adequate by my interpretation.    COORDINATION OF CARE: 11:43 PM Discussed treatment plan with patient at beside, including antibiotics since chest x-ray reveals evidence of pneumonia. The patient agrees with the plan and has no further questions at this time.  1:10 AM Pt hypoxic with ambulation Will admit for pneumonia D/w dr Orvan Falconer for admission    Labs Review Labs Reviewed  BASIC METABOLIC PANEL - Abnormal; Notable for the following:    Sodium 133 (*)    Glucose, Bld 122 (*)    Calcium 8.1 (*)    GFR calc non Af Amer 79 (*)    All other components within normal limits  CBC WITH DIFFERENTIAL/PLATELET - Abnormal; Notable for the following:    WBC 11.9 (*)    RBC 3.52 (*)    Hemoglobin 11.2 (*)    HCT 34.1 (*)    Platelets 133 (*)    Neutrophils Relative % 87 (*)    Neutro Abs 10.4 (*)    Lymphocytes Relative 6 (*)    All other components within normal limits    Imaging Review Dg Chest 2 View  06/28/2014   CLINICAL DATA:  Fever and upper respiratory symptoms for several days  EXAM: CHEST  2 VIEW  COMPARISON:  None.  FINDINGS: There is lingular airspace opacity with volume loss. The right lung is clear. Mild chronic appearing interstitial coarsening is present. No effusions are evident. Hilar and mediastinal contours appear unremarkable.  IMPRESSION: Lingular infiltrate, possibly pneumonia. Recommend follow-up radiography to confirm complete clearing.   Electronically Signed   By: Andreas Newport M.D.   On: 06/28/2014 23:00   Medications  cefTRIAXone (ROCEPHIN) 1 g in dextrose 5 % 50 mL IVPB (not administered)  azithromycin (ZITHROMAX) tablet 500 mg (not administered)  acetaminophen (TYLENOL) tablet 650 mg (650 mg Oral Given 06/28/14 2322)    MDM   Final diagnoses:  CAP (community acquired  pneumonia)    Nursing notes including past medical history and social history reviewed and considered in documentation xrays/imaging reviewed by myself and considered during evaluation Labs/vital reviewed myself and considered during evaluation   I personally performed the services described in this documentation, which was scribed in my presence. The recorded information has been reviewed and is accurate.      Thomas Fraise, MD 06/29/14 0111

## 2014-06-28 NOTE — ED Notes (Signed)
Cough X1 week, patient states weakness, and body aches

## 2014-06-29 ENCOUNTER — Encounter (HOSPITAL_COMMUNITY): Payer: Self-pay | Admitting: Internal Medicine

## 2014-06-29 DIAGNOSIS — I10 Essential (primary) hypertension: Secondary | ICD-10-CM | POA: Diagnosis not present

## 2014-06-29 DIAGNOSIS — Z85820 Personal history of malignant melanoma of skin: Secondary | ICD-10-CM | POA: Diagnosis not present

## 2014-06-29 DIAGNOSIS — J189 Pneumonia, unspecified organism: Secondary | ICD-10-CM | POA: Diagnosis not present

## 2014-06-29 DIAGNOSIS — D696 Thrombocytopenia, unspecified: Secondary | ICD-10-CM | POA: Diagnosis not present

## 2014-06-29 DIAGNOSIS — R0902 Hypoxemia: Secondary | ICD-10-CM | POA: Diagnosis present

## 2014-06-29 DIAGNOSIS — R001 Bradycardia, unspecified: Secondary | ICD-10-CM | POA: Diagnosis present

## 2014-06-29 DIAGNOSIS — E861 Hypovolemia: Secondary | ICD-10-CM | POA: Diagnosis present

## 2014-06-29 DIAGNOSIS — E871 Hypo-osmolality and hyponatremia: Secondary | ICD-10-CM | POA: Diagnosis not present

## 2014-06-29 DIAGNOSIS — C61 Malignant neoplasm of prostate: Secondary | ICD-10-CM | POA: Diagnosis present

## 2014-06-29 DIAGNOSIS — D638 Anemia in other chronic diseases classified elsewhere: Secondary | ICD-10-CM | POA: Diagnosis present

## 2014-06-29 DIAGNOSIS — Z7982 Long term (current) use of aspirin: Secondary | ICD-10-CM | POA: Diagnosis not present

## 2014-06-29 HISTORY — DX: Pneumonia, unspecified organism: J18.9

## 2014-06-29 LAB — BASIC METABOLIC PANEL
ANION GAP: 8 (ref 5–15)
BUN: 18 mg/dL (ref 6–23)
CALCIUM: 8.1 mg/dL — AB (ref 8.4–10.5)
CHLORIDE: 98 mmol/L (ref 96–112)
CO2: 27 mmol/L (ref 19–32)
Creatinine, Ser: 0.8 mg/dL (ref 0.50–1.35)
GFR calc Af Amer: >90 mL/min (ref >90)
GFR calc non Af Amer: 79 mL/min — ABNORMAL LOW (ref >90)
Glucose, Bld: 122 mg/dL — ABNORMAL HIGH (ref 70–99)
POTASSIUM: 4.3 mmol/L (ref 3.5–5.1)
Sodium: 133 mmol/L — ABNORMAL LOW (ref 135–145)

## 2014-06-29 LAB — INFLUENZA PANEL BY PCR (TYPE A & B)
H1N1 flu by pcr: NOT DETECTED
INFLBPCR: NEGATIVE
Influenza A By PCR: NEGATIVE

## 2014-06-29 MED ORDER — DEXTROSE 5 % IV SOLN
1.0000 g | Freq: Once | INTRAVENOUS | Status: AC
  Administered 2014-06-29: 1 g via INTRAVENOUS
  Filled 2014-06-29: qty 10

## 2014-06-29 MED ORDER — POLYETHYLENE GLYCOL 3350 17 G PO PACK
17.0000 g | PACK | Freq: Every day | ORAL | Status: DC
Start: 2014-06-29 — End: 2014-07-01
  Administered 2014-06-29 – 2014-07-01 (×3): 17 g via ORAL
  Filled 2014-06-29 (×3): qty 1

## 2014-06-29 MED ORDER — LISINOPRIL 5 MG PO TABS
2.5000 mg | ORAL_TABLET | Freq: Every day | ORAL | Status: DC
  Administered 2014-06-29 – 2014-07-01 (×3): 2.5 mg via ORAL
  Filled 2014-06-29 (×3): qty 1

## 2014-06-29 MED ORDER — ALBUTEROL SULFATE (2.5 MG/3ML) 0.083% IN NEBU
2.5000 mg | INHALATION_SOLUTION | Freq: Three times a day (TID) | RESPIRATORY_TRACT | Status: DC
  Administered 2014-06-29 – 2014-07-01 (×6): 2.5 mg via RESPIRATORY_TRACT
  Filled 2014-06-29 (×6): qty 3

## 2014-06-29 MED ORDER — ACETAMINOPHEN 325 MG PO TABS
650.0000 mg | ORAL_TABLET | Freq: Four times a day (QID) | ORAL | Status: DC | PRN
  Administered 2014-06-29 – 2014-07-01 (×5): 650 mg via ORAL
  Filled 2014-06-29 (×5): qty 2

## 2014-06-29 MED ORDER — ADULT MULTIVITAMIN W/MINERALS CH
1.0000 | ORAL_TABLET | Freq: Every day | ORAL | Status: DC
  Administered 2014-06-29 – 2014-07-01 (×3): 1 via ORAL
  Filled 2014-06-29 (×3): qty 1

## 2014-06-29 MED ORDER — ENOXAPARIN SODIUM 40 MG/0.4ML ~~LOC~~ SOLN
40.0000 mg | SUBCUTANEOUS | Status: DC
  Administered 2014-06-29 – 2014-07-01 (×3): 40 mg via SUBCUTANEOUS
  Filled 2014-06-29 (×3): qty 0.4

## 2014-06-29 MED ORDER — ASPIRIN EC 81 MG PO TBEC
81.0000 mg | DELAYED_RELEASE_TABLET | Freq: Every day | ORAL | Status: DC
  Administered 2014-06-29 – 2014-07-01 (×3): 81 mg via ORAL
  Filled 2014-06-29 (×3): qty 1

## 2014-06-29 MED ORDER — ALBUTEROL SULFATE (2.5 MG/3ML) 0.083% IN NEBU
2.5000 mg | INHALATION_SOLUTION | Freq: Four times a day (QID) | RESPIRATORY_TRACT | Status: DC
  Administered 2014-06-29: 2.5 mg via RESPIRATORY_TRACT
  Filled 2014-06-29: qty 3

## 2014-06-29 MED ORDER — DEXTROSE 5 % IV SOLN
1.0000 g | INTRAVENOUS | Status: DC
  Administered 2014-06-30 – 2014-07-01 (×2): 1 g via INTRAVENOUS
  Filled 2014-06-29 (×3): qty 10

## 2014-06-29 MED ORDER — DOCUSATE SODIUM 100 MG PO CAPS
100.0000 mg | ORAL_CAPSULE | Freq: Two times a day (BID) | ORAL | Status: DC
  Administered 2014-06-29 – 2014-07-01 (×5): 100 mg via ORAL
  Filled 2014-06-29 (×5): qty 1

## 2014-06-29 MED ORDER — CEFTRIAXONE SODIUM IN DEXTROSE 20 MG/ML IV SOLN
1.0000 g | INTRAVENOUS | Status: DC
  Filled 2014-06-29: qty 50

## 2014-06-29 MED ORDER — DOXAZOSIN MESYLATE 2 MG PO TABS
1.0000 mg | ORAL_TABLET | Freq: Every day | ORAL | Status: DC
  Administered 2014-06-29 – 2014-07-01 (×3): 1 mg via ORAL
  Filled 2014-06-29 (×3): qty 1

## 2014-06-29 MED ORDER — DEXTROSE 5 % IV SOLN
500.0000 mg | INTRAVENOUS | Status: DC
  Administered 2014-06-30: 500 mg via INTRAVENOUS
  Filled 2014-06-29 (×2): qty 500

## 2014-06-29 MED ORDER — ACETAMINOPHEN 650 MG RE SUPP: 650.0000 mg | Freq: Four times a day (QID) | RECTAL | Status: DC | PRN

## 2014-06-29 MED ORDER — ONDANSETRON HCL 4 MG/2ML IJ SOLN: 4.0000 mg | Freq: Four times a day (QID) | INTRAMUSCULAR | Status: DC | PRN

## 2014-06-29 MED ORDER — AZITHROMYCIN 250 MG PO TABS
500.0000 mg | ORAL_TABLET | Freq: Once | ORAL | Status: AC
  Administered 2014-06-29: 500 mg via ORAL
  Filled 2014-06-29: qty 2

## 2014-06-29 MED ORDER — SALINE SPRAY 0.65 % NA SOLN: 2.0000 | NASAL | Status: DC | PRN

## 2014-06-29 MED ORDER — ONDANSETRON HCL 4 MG PO TABS: 4.0000 mg | ORAL_TABLET | Freq: Four times a day (QID) | ORAL | Status: DC | PRN

## 2014-06-29 MED ORDER — SODIUM CHLORIDE 0.9 % IV SOLN
INTRAVENOUS | Status: DC
  Administered 2014-06-29: 04:00:00 via INTRAVENOUS

## 2014-06-29 MED ORDER — GUAIFENESIN-CODEINE 100-10 MG/5ML PO SOLN: 5.0000 mL | ORAL | Status: DC | PRN

## 2014-06-29 NOTE — ED Notes (Signed)
Patient ambulatory around nurses station, patient ambulatory with cain. Patients oxygen saturation started at 93% on room air, while ambulating patients oxygen saturation dropped to 88%. No complaints of shortness of breath, or pain while ambulating. EDP notified.

## 2014-06-29 NOTE — H&P (Signed)
[facility] History and Physical  Thomas Vega KWI:[id number] DOB: 02-11-1927    PCP:   Jani Gravel, MD   Chief Complaint: coughs and fever.   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  He has been visiting his fiance at the nursing home, and wondered if he had been exposed to an infection there.  He denied any CP or SOB, and hadn't had diffuse myalgia, but thighs have been aching.  Evaluation in the ER included a mild leukocytosis with WBC of 11K, and CXR showed a lingular infiltrate.  In the ER, his oxygen saturation was 93 percent on RA at rest, but desat to 88 percent with ambulation.  His renal fx tests were unremarkable, and he maintained normal hemodynamics.  Hospitalist was asked to admit him for CAP.   Rewiew of Systems:  Constitutional: Negative for chills. No significant weight loss or weight gain Eyes: Negative for eye pain, redness and discharge, diplopia, visual changes, or flashes of light. ENMT: Negative for ear pain, hoarseness,  sinus pressure and sore throat. No headaches; tinnitus, drooling, or problem swallowing. Cardiovascular: Negative for chest pain, palpitations, diaphoresis, dyspnea and peripheral edema. ; No orthopnea, PND Respiratory: Negative for  hemoptysis,  and stridor. No pleuritic chestpain. Gastrointestinal: Negative for nausea, vomiting, diarrhea, constipation, abdominal pain, melena, blood in stool, hematemesis, jaundice and rectal bleeding.    Genitourinary: Negative for frequency, dysuria, incontinence,flank pain and hematuria; Musculoskeletal: Negative for neck pain. Negative for swelling and trauma.;  Skin: . Negative for pruritus, rash, abrasions, bruising and skin lesion.; ulcerations Neuro: Negative for headache,  lightheadedness and neck stiffness. Negative for weakness, altered level of consciousness , altered mental status, extremity weakness, burning feet, involuntary movement, seizure and syncope.  Psych: negative for anxiety, depression, insomnia, tearfulness, panic attacks, hallucinations, paranoia, suicidal or homicidal ideation    Past Medical History  Diagnosis Date  . Irregular heart rate   . Melanoma   . Prostate cancer     Past Surgical History  Procedure Laterality Date  . Back surgery    . Knee surgery      Medications:  HOME MEDS: Prior to Admission medications   Medication Sig Start Date End Date Taking? Authorizing Provider  aspirin EC 81 MG tablet Take 81 mg by mouth daily.    Historical Provider, MD  doxazosin (CARDURA) 2 MG tablet Take 1 mg by mouth daily.    Historical Provider, MD  lisinopril (PRINIVIL,ZESTRIL) 5 MG tablet Take 2.5 mg by mouth daily.    Historical Provider, MD  Multiple Vitamin (MULTIVITAMIN WITH MINERALS) TABS tablet Take 1 tablet by mouth daily.    Historical Provider, MD  OVER THE COUNTER MEDICATION Take 1 tablet by mouth daily as needed (antihistamine. Allergies.).     Historical Provider, MD  sodium chloride (OCEAN) 0.65 % nasal spray Place 2 sprays into the nose 2 (two) times daily as needed for congestion.    Historical Provider, MD     Allergies:  No Known Allergies  Social History:   reports that he has never smoked. He does not have any smokeless tobacco history on file. He reports that he does not drink alcohol or use illicit drugs.  Family History: History reviewed. No pertinent family history.   Physical Exam:  Filed Vitals:   06/28/14 2151 06/29/14 0029  BP: 127/50 129/55  Pulse: 94 84  Temp: 100.2 F (37.9 C) 99.5 F (37.5 C)  TempSrc: Oral Oral  Resp: 20 20  Height: 6' 3.5" (1.918 m)   Weight: 95.255 kg (210 lb)   SpO2: 94% 92%   Blood pressure 129/55, pulse 84, temperature 99.5 F (37.5 C), temperature source Oral,  resp. rate 20, height 6' 3.5" (1.918 m), weight 95.255 kg (210 lb), SpO2 92 %.  GEN:  Pleasant  patient lying in the stretcher in no acute distress; cooperative with exam. PSYCH:  alert and oriented x4; does not appear anxious or depressed; affect is appropriate. HEENT: Mucous membranes pink and anicteric; PERRLA; EOM intact; no cervical lymphadenopathy nor thyromegaly or carotid bruit; no JVD; There were no stridor. Neck is very supple. Breasts:: Not examined CHEST WALL: No tenderness CHEST: Normal respiration, slight wheezing with deep coughs, no rales   HEART: Regular rate and rhythm.  There are no murmur, rub, or gallops.   BACK: No kyphosis or scoliosis; no CVA tenderness ABDOMEN: soft and non-tender; no masses, no organomegaly, normal abdominal bowel sounds; no pannus; no intertriginous candida. There is no rebound and no distention. Rectal Exam: Not done EXTREMITIES: No bone or joint deformity; age-appropriate arthropathy of the hands and knees; no edema; no ulcerations.  There is no calf tenderness. Genitalia: not examined PULSES: 2+ and symmetric SKIN: Normal hydration no rash or ulceration CNS: Cranial nerves 2-12 grossly intact no focal lateralizing neurologic deficit.  Speech is fluent; uvula elevated with phonation, facial symmetry and tongue midline. DTR are normal bilaterally, cerebella exam is intact, barbinski is negative and strengths are equaled bilaterally.  No sensory loss.   Labs on Admission:  Basic Metabolic Panel:  Recent Labs Lab 06/28/14 2310  NA 133*  K 4.3  CL 98  CO2 27  GLUCOSE 122*  BUN 18  CREATININE 0.80  CALCIUM 8.1*   CBC:  Recent Labs Lab 06/28/14 2310  WBC 11.9*  NEUTROABS 10.4*  HGB 11.2*  HCT 34.1*  MCV 96.9  PLT 133*   Radiological Exams on Admission: Dg Chest 2 View  06/28/2014   CLINICAL DATA:  Fever and upper respiratory symptoms for several days  EXAM: CHEST  2 VIEW  COMPARISON:  None.  FINDINGS: There is lingular airspace  opacity with volume loss. The right lung is clear. Mild chronic appearing interstitial coarsening is present. No effusions are evident. Hilar and mediastinal contours appear unremarkable.  IMPRESSION: Lingular infiltrate, possibly pneumonia. Recommend follow-up radiography to confirm complete clearing.   Electronically Signed   By: Andreas Newport M.D.   On: 06/28/2014 23:00   Assessment/Plan Present on Admission:  . CAP (community acquired pneumonia) . Hypoxia . HTN (hypertension) . Prostate cancer  PLAN:  Will admit Dr Grandville Silos for CAP.  Will give supplemental oxygen, and some IVF. He will benefit getting neb tx, so will give Duoneb QID.  Will continue IV Rocephin and IV Zithromax.   To be cautious, he will be placed on droplet precaution pending Influenza testings.  He did have his flu shot this year.  For his HTN, will continue his low dose Lisinopril.    He is stable, full code, and will be admitted to general medical floor under [facility].  Thank you, Dr Maudie Mercury, for allowing me to care for your nice patient, Dr Grandville Silos.   Other plans as per orders.  Code Status: FULL CODEOrvan Falconer, MD. [facility] Pager  [phone] 7pm to 7am.  06/29/2014, 1:17 AM

## 2014-06-29 NOTE — Progress Notes (Signed)
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 Marin Comment this morning for many acquired pneumonia. The patient was briefly seen and examined. His chart, vital signs, laboratory studies reviewed. Agree with current management.  His influenza panel was negative.

## 2014-06-30 DIAGNOSIS — D696 Thrombocytopenia, unspecified: Secondary | ICD-10-CM

## 2014-06-30 DIAGNOSIS — E871 Hypo-osmolality and hyponatremia: Secondary | ICD-10-CM

## 2014-06-30 HISTORY — DX: Thrombocytopenia, unspecified: D69.6

## 2014-06-30 LAB — CBC
HCT: 31.1 % — ABNORMAL LOW (ref 39.0–52.0)
Hemoglobin: 10.2 g/dL — ABNORMAL LOW (ref 13.0–17.0)
MCH: 31.9 pg (ref 26.0–34.0)
MCHC: 32.8 g/dL (ref 30.0–36.0)
MCV: 97.2 fL (ref 78.0–100.0)
Platelets: 107 10*3/uL — ABNORMAL LOW (ref 150–400)
RBC: 3.2 MIL/uL — ABNORMAL LOW (ref 4.22–5.81)
RDW: 13.3 % (ref 11.5–15.5)
WBC: 7.9 10*3/uL (ref 4.0–10.5)

## 2014-06-30 LAB — VITAMIN B12: Vitamin B-12: 625 pg/mL (ref 211–911)

## 2014-06-30 LAB — BASIC METABOLIC PANEL
ANION GAP: 5 (ref 5–15)
BUN: 17 mg/dL (ref 6–23)
CHLORIDE: 106 mmol/L (ref 96–112)
CO2: 29 mmol/L (ref 19–32)
CREATININE: 0.75 mg/dL (ref 0.50–1.35)
Calcium: 7.9 mg/dL — ABNORMAL LOW (ref 8.4–10.5)
GFR calc Af Amer: >90 mL/min (ref >90)
GFR calc non Af Amer: 81 mL/min — ABNORMAL LOW (ref >90)
GLUCOSE: 96 mg/dL (ref 70–99)
Potassium: 3.8 mmol/L (ref 3.5–5.1)
Sodium: 140 mmol/L (ref 135–145)

## 2014-06-30 LAB — TSH: TSH: 2.69 u[IU]/mL (ref 0.350–4.500)

## 2014-06-30 MED ORDER — AZITHROMYCIN 250 MG PO TABS
500.0000 mg | ORAL_TABLET | Freq: Every day | ORAL | Status: DC
  Administered 2014-06-30: 500 mg via ORAL
  Filled 2014-06-30: qty 2

## 2014-06-30 NOTE — Progress Notes (Signed)
[facility] PROGRESS NOTE  Thomas Vega IDP:[id number] DOB: [birth year] DOA: 06/28/2014 PCP: Jani Gravel, MD    Code Status: full code Family Communication: family not available; discussed with patient. Disposition Plan: discharged to home when clinically appropriate, possibly in 24-48 hours.   Consultants:  none  Procedures:  none  Antibiotics:  Azithromycin 06/29/14>>  Rocephin 06/29/14>>  HPI/Subjective: The patient says he is breathing and feeling a bit better. He has a productive cough with slightly blood-tinged sputum. He denies pleurisy or shortness of breath.  Objective: Filed Vitals:   06/30/14 0619  BP: 157/48  Pulse: 49  Temp: 97.6 F (36.4 C)  Resp: 20  oxygen saturation 93-95%; patient ambulated on room air with oxygen saturation of 95%.  Intake/Output Summary (Last 24 hours) at 06/30/14 1342 Last data filed at 06/29/14 1740  Gross per 24 hour  Intake    240 ml  Output      0 ml  Net    240 ml   Filed Weights   06/28/14 2151 06/29/14 0325  Weight: 95.255 kg (210 lb) 95.346 kg (210 lb 3.2 oz)    Exam:   General:  Pleasant elderly 81 year old man in no acute distress.  Cardiovascular: S1, S2, with bradycardia and a soft systolic murmur.  Respiratory: occasional crackles/wheezes on the left; breathing nonlabored.  Abdomen: positive bowel sounds, soft, nontender, nondistended.  Musculoskeletal/extremities: No pedal edema. No acute hot red joints.  He is alert and oriented 3. Cranial nerves II through XII are intact.   Data Reviewed: Basic Metabolic Panel:  Recent Labs Lab 06/28/14 2310 06/30/14 0609  NA 133* 140  K 4.3 3.8  CL 98 106  CO2 27 29  GLUCOSE 122* 96  BUN 18 17  CREATININE 0.80 0.75  CALCIUM 8.1* 7.9*   Liver Function Tests: No results for input(s): AST, ALT, ALKPHOS, BILITOT, PROT, ALBUMIN in the last 168 hours. No results for input(s): LIPASE, AMYLASE in the last 168 hours. No results for input(s): AMMONIA in  the last 168 hours. CBC:  Recent Labs Lab 06/28/14 2310 06/30/14 0609  WBC 11.9* 7.9  NEUTROABS 10.4*  --   HGB 11.2* 10.2*  HCT 34.1* 31.1*  MCV 96.9 97.2  PLT 133* 107*   Cardiac Enzymes: No results for input(s): CKTOTAL, CKMB, CKMBINDEX, TROPONINI in the last 168 hours. BNP (last 3 results) No results for input(s): BNP in the last 8760 hours.  ProBNP (last 3 results) No results for input(s): PROBNP in the last 8760 hours.  CBG: No results for input(s): GLUCAP in the last 168 hours.  No results found for this or any previous visit (from the past 240 hour(s)).   Studies: Dg Chest 2 View  06/28/2014   CLINICAL DATA:  Fever and upper respiratory symptoms for several days  EXAM: CHEST  2 VIEW  COMPARISON:  None.  FINDINGS: There is lingular airspace opacity with volume loss. The right lung is clear. Mild chronic appearing interstitial coarsening is present. No effusions are evident. Hilar and mediastinal contours appear unremarkable.  IMPRESSION: Lingular infiltrate, possibly pneumonia. Recommend follow-up radiography to confirm complete clearing.   Electronically Signed   By: Andreas Newport M.D.   On: 06/28/2014 23:00    Scheduled Meds: . albuterol  2.5 mg Nebulization TID  . aspirin EC  81 mg Oral Daily  . azithromycin  500 mg Oral QHS  . cefTRIAXone (ROCEPHIN)  IV  1 g Intravenous Q24H  . docusate sodium  100 mg Oral BID  .  doxazosin  1 mg Oral Daily  . enoxaparin (LOVENOX) injection  40 mg Subcutaneous Q24H  . lisinopril  2.5 mg Oral Daily  . multivitamin with minerals  1 tablet Oral Daily  . polyethylene glycol  17 g Oral Daily   Continuous Infusions:   Assessment and plan:  Principal Problem:   CAP (community acquired pneumonia) Active Problems:   Hypoxia   HTN (hypertension)   Prostate cancer   Hyponatremia   1. Community-acquired pneumonia. On admission, he was mildly febrile with a temperature of 100.2. His white blood cell count was elevated at  11.9.His influenza PCR was nonreactive.  The patient was started on azithromycin and Rocephin. IV fluid hydration was started with normal saline. He was treated supportively with oxygen as needed.guaifenesin with codeine was ordered as needed for cough. He has remained afebrile. His white blood cell count normalized. His oxygen saturation on room air has improved. We'll continue current management with possibility of discharge tomorrow.  Mild transient hypoxia. The patient's oxygen saturation was 93% on room air, but decreased slightly to 88% on room air with ambulation in the ED. Oxygen was applied and titrated to comfort. Today, he ambulated with the nursing staff on room air and his oxygen saturation ranged from 93-95%.  Essential hypertension. Currently stable. Lisinopril was continued.  Normocytic anemia. This is likely chronic from his history of prostate cancer. Given his age, I would not pursue any furtherinvasive diagnostic workup.his vitamin B12 level was assessed and is pending. His TSH was within normal limits. Packed red blood cell transfusion is not needed or warranted for hemoglobin ranging from 10-11.  Thrombocytopenia. Patient's platelet count was 133 on admission. With IV fluid hydration, it decreased. For evaluation, vitamin B12 and TSH were ordered. His TSH was within normal limits. Vitamin B12 levels pending. Etiology could be secondary to infection, hemodilutional, or chronic. We'll continue to monitor.  Hyponatremia. His serum sodium was 133 on admission. He was started on IV fluid hydration with normal saline. His serum sodium normalized. IV fluids were discontinued.   Time spent: 30 minutes    [facility] Pager [phone]. If 7PM-7AM, please contact night-coverage at [email or web address], password [facility] 06/30/2014, 1:42 PM  LOS: 1 day

## 2014-06-30 NOTE — Progress Notes (Signed)
Patient ambulated hallways with assist and cane approximately 300 ft.  Appeared to be slightly unsteady, but otherwise tolerated well.  O2 sats while ambulating 95%.

## 2014-06-30 NOTE — Care Management Note (Addendum)
    Page 1 of 1   07/01/2014     11:55:28 AM CARE MANAGEMENT NOTE 07/01/2014  Patient:  Thomas Vega, Thomas Vega   Account Number:  [id number]  Date Initiated:  06/30/2014  Documentation initiated by:  Jolene Provost  Subjective/Objective Assessment:   Pt is from home, independent at baseline. Pt uses a cane PRN. Pt has no HH services or other DME's. Pt plans to discharge home with self care. NO CM needs.     Action/Plan:   Anticipated DC Date:  07/02/2014   Anticipated DC Plan:  [place]  CM consult      Choice offered to / List presented to:             Status of service:  Completed, signed off Medicare Important Message given?  YES (If response is "NO", the following Medicare IM given date fields will be blank) Date Medicare IM given:  07/01/2014 Medicare IM given by:  Jolene Provost Date Additional Medicare IM given:   Additional Medicare IM given by:    Discharge Disposition:  HOME/SELF CARE  Per UR Regulation:  Reviewed for med. necessity/level of care/duration of stay  If discussed at [place] of Stay Meetings, dates discussed:    Comments:  07/01/2014 [place], RN, MSN, CM Pt being discharged home today with self care. No CM needs. 06/30/2014 [place], RN, MSN, CM

## 2014-06-30 NOTE — Care Management Utilization Note (Signed)
UR completed 

## 2014-06-30 NOTE — Progress Notes (Signed)
PHARMACIST - PHYSICIAN COMMUNICATION DR:   Caryn Section CONCERNING: Antibiotic IV to Oral Route Change Policy  RECOMMENDATION: This patient is receiving Zithromax by the intravenous route.  Based on criteria approved by the Pharmacy and Therapeutics Committee, the antibiotic(s) is/are being converted to the equivalent oral dose form(s).   DESCRIPTION: These criteria include:  Patient being treated for a respiratory tract infection, urinary tract infection, cellulitis or clostridium difficile associated diarrhea if on metronidazole  The patient is not neutropenic and does not exhibit a GI malabsorption state  The patient is eating (either orally or via tube) and/or has been taking other orally administered medications for a least 24 hours  The patient is improving clinically and has a Tmax < 100.5  If you have questions about this conversion, please contact the Pharmacy Department  [x]   [phone] )  [facility] []   [phone] )  [facility]  []   [phone] )  [facility] []   [phone] )  [place], PharmD, BCPS 06/30/2014@9 :58 AM

## 2014-07-01 ENCOUNTER — Encounter (HOSPITAL_COMMUNITY): Payer: Self-pay | Admitting: Internal Medicine

## 2014-07-01 DIAGNOSIS — C61 Malignant neoplasm of prostate: Secondary | ICD-10-CM

## 2014-07-01 DIAGNOSIS — R001 Bradycardia, unspecified: Secondary | ICD-10-CM

## 2014-07-01 HISTORY — DX: Bradycardia, unspecified: R00.1

## 2014-07-01 LAB — CBC
HEMATOCRIT: 31.5 % — AB (ref 39.0–52.0)
HEMOGLOBIN: 10.5 g/dL — AB (ref 13.0–17.0)
MCH: 32.2 pg (ref 26.0–34.0)
MCHC: 33.3 g/dL (ref 30.0–36.0)
MCV: 96.6 fL (ref 78.0–100.0)
Platelets: 133 10*3/uL — ABNORMAL LOW (ref 150–400)
RBC: 3.26 MIL/uL — ABNORMAL LOW (ref 4.22–5.81)
RDW: 13.4 % (ref 11.5–15.5)
WBC: 6.8 10*3/uL (ref 4.0–10.5)

## 2014-07-01 LAB — BASIC METABOLIC PANEL
Anion gap: 5 (ref 5–15)
BUN: 14 mg/dL (ref 6–23)
CHLORIDE: 105 mmol/L (ref 96–112)
CO2: 30 mmol/L (ref 19–32)
Calcium: 8.3 mg/dL — ABNORMAL LOW (ref 8.4–10.5)
Creatinine, Ser: 0.72 mg/dL (ref 0.50–1.35)
GFR, EST NON AFRICAN AMERICAN: 82 mL/min — AB (ref >90)
Glucose, Bld: 88 mg/dL (ref 70–99)
Potassium: 4.5 mmol/L (ref 3.5–5.1)
Sodium: 140 mmol/L (ref 135–145)

## 2014-07-01 MED ORDER — CEFUROXIME AXETIL 500 MG PO TABS: 500.0000 mg | ORAL_TABLET | Freq: Two times a day (BID) | ORAL | Status: DC

## 2014-07-01 MED ORDER — AZITHROMYCIN 250 MG PO TABS
500.0000 mg | ORAL_TABLET | Freq: Every day | ORAL | Status: DC
  Administered 2014-07-01: 500 mg via ORAL
  Filled 2014-07-01: qty 2

## 2014-07-01 MED ORDER — GUAIFENESIN-DM 100-10 MG/5ML PO SYRP: 5.0000 mL | ORAL_SOLUTION | ORAL | Status: AC | PRN

## 2014-07-01 NOTE — Discharge Summary (Signed)
Physician Discharge Summary  Thomas Vega YJE:[id number] DOB: October 14, 1926 DOA: 06/28/2014  PCP: Jani Gravel, MD  Admit date: 06/28/2014 Discharge date: 07/01/2014  Time spent: Greater than 30 minutes  Recommendations for Outpatient Follow-up:  1. Recommend follow-up of the patient's hemoglobin, platelet count, and heart rate.  2.  Discharge Diagnoses:  1. Community-acquired pneumonia. 2. Transient borderline hypoxia. 3. Hyponatremia secondary to hypovolemia. 4. Thrombocytopenia, possibly secondary to infection versus chronically low. 5. Normocytic anemia, likely from chronic disease. 6. Essential hypertension. 7. Prostate cancer, not undergoing treatment.    Discharge Condition: Improved.  Diet recommendation: Heart healthy.  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 In the ED, he was febrile with a temperature of 100.25F, and otherwise hemodynamically stable. His oxygen saturation fell to 88% on room air with ambulation. His white blood cell count was slightly elevated at 11.9, sodium was slightly low at 133, and his platelet count was slightly low at 133. His chest x-ray revealed lingular infiltrate, possibly pneumonia. He was admitted for further evaluation and management.   Hospital Course:   1. Community-acquired pneumonia. On admission, the patient was mildly febrile with a temperature of 100.2. His white blood cell count was elevated at 11.9. Influenza PCR was ordered and it was nonreactive.  The patient was started on azithromycin and Rocephin. IV fluid hydration was started with normal saline. He was treated supportively with oxygen as needed. Guaifenesin  with codeine was ordered as needed for cough. He had remained afebrile. His white blood cell count normalized. His oxygen saturation on room air at rest was in the mid to upper 90s. With ambulation, his oxygen saturations improved to the mid 90s. He received 2-1/2 days of IV antibiotics and was discharged on 5 more days of antibiotic therapy with Ceftin.  Mild transient hypoxia. The patient's oxygen saturation was 93% on room air, but decreased slightly to 88% on room air with ambulation in the ED. Oxygen was applied and titrated to comfort. Prior to discharge, he ambulated with the nursing staff on room air and his oxygen saturation ranged from 93-95%.  Essential hypertension. Currently stable. Lisinopril was continued.  Sinus bradycardia. The patient's heart rate ranged from 48-60 during the hospitalization. He was asymptomatic. His TSH was within normal limits. Recommend further monitoring by Dr. Maudie Mercury.  Normocytic anemia. This is likely chronic from his history of prostate cancer and history of melanoma. He is vitamin B12 level and TSH were assessed and they were both within normal limits. His hemoglobin fell slightly from 11.2-10.5 with the dilutional effects of IV fluids. Further management will be deferred to his PCP.  Thrombocytopenia. Patient's platelet count was 133 on admission. With IV fluid hydration, it decreased. For evaluation, vitamin B12 and TSH were ordered. They were both within normal limits. Etiology could be secondary to infection, hemodilutional, or chronic. His platelet count rebounded to 133 at the time of discharge.  Hyponatremia. His serum sodium was 133 on admission. He was started on IV fluid hydration with normal saline. His serum sodium normalized. IV fluids were discontinued.   Procedures:  None  Consultations:  None  Discharge Exam: Filed Vitals:   07/01/14 6378  BP: 138/62  Pulse: 56  Temp: 98.3 F (36.8 C)  Resp: 18   oxygen saturation 98%  on room air.   General: Pleasant elderly 81 year old man in no acute distress.  Cardiovascular: S1, S2, with bradycardia and a soft systolic murmur.  Respiratory: Rare crackles on the left, significantly decreased; breathing nonlabored.  Abdomen: positive bowel sounds, soft, nontender, nondistended.  Musculoskeletal/extremities: No pedal edema. No acute hot red joints.  He is alert and oriented 3. Cranial nerves II through XII are intact.  Discharge Instructions   Discharge Instructions    Diet - low sodium heart healthy    Complete by:  As directed      Increase activity slowly    Complete by:  As directed           Current Discharge Medication List    START taking these medications   Details  cefUROXime (CEFTIN) 500 MG tablet Take 1 tablet (500 mg total) by mouth 2 (two) times daily with a meal. Antibiotic. Starting tomorrow, take as directed for 5 more days/ Qty: 10 tablet, Refills: 0    guaiFENesin-dextromethorphan (ROBITUSSIN DM) 100-10 MG/5ML syrup Take 5 mLs by mouth every 4 (four) hours as needed for cough.      CONTINUE these medications which have NOT CHANGED   Details  aspirin EC 81 MG tablet Take 81 mg by mouth daily.    doxazosin (CARDURA) 2 MG tablet Take 1 mg by mouth daily.    lisinopril (PRINIVIL,ZESTRIL) 5 MG tablet Take 2.5 mg by mouth daily.    Multiple Vitamin (MULTIVITAMIN WITH MINERALS) TABS tablet Take 1 tablet by mouth daily.    OVER THE COUNTER MEDICATION Take 1 tablet by mouth daily as needed (antihistamine. Allergies.).     sodium chloride (OCEAN) 0.65 % nasal spray Place 2 sprays into the nose 2 (two) times daily as needed for congestion.       No Known Allergies Follow-up Information    Follow up with Jani Gravel, MD On 07/15/2014.   Specialty:  Internal Medicine   Why:  at 3:15 pm   Contact information:   [address] [place] [address] [phone]        The results of significant diagnostics from  this hospitalization (including imaging, microbiology, ancillary and laboratory) are listed below for reference.    Significant Diagnostic Studies: Dg Chest 2 View  06/28/2014   CLINICAL DATA:  Fever and upper respiratory symptoms for several days  EXAM: CHEST  2 VIEW  COMPARISON:  None.  FINDINGS: There is lingular airspace opacity with volume loss. The right lung is clear. Mild chronic appearing interstitial coarsening is present. No effusions are evident. Hilar and mediastinal contours appear unremarkable.  IMPRESSION: Lingular infiltrate, possibly pneumonia. Recommend follow-up radiography to confirm complete clearing.   Electronically Signed   By: Andreas Newport M.D.   On: 06/28/2014 23:00    Microbiology: No results found for this or any previous visit (from the past 240 hour(s)).   Labs: Basic Metabolic Panel:  Recent Labs Lab 06/28/14 2310 06/30/14 0609 07/01/14 0546  NA 133* 140 140  K 4.3 3.8 4.5  CL 98 106 105  CO2 27 29 30   GLUCOSE 122* 96 88  BUN 18 17 14   CREATININE 0.80 0.75 0.72  CALCIUM 8.1* 7.9* 8.3*   Liver Function Tests: No results for input(s): AST, ALT, ALKPHOS, BILITOT, PROT, ALBUMIN in the last 168 hours. No results for input(s): LIPASE, AMYLASE in the last 168  hours. No results for input(s): AMMONIA in the last 168 hours. CBC:  Recent Labs Lab 06/28/14 2310 06/30/14 0609 07/01/14 0546  WBC 11.9* 7.9 6.8  NEUTROABS 10.4*  --   --   HGB 11.2* 10.2* 10.5*  HCT 34.1* 31.1* 31.5*  MCV 96.9 97.2 96.6  PLT 133* 107* 133*   Cardiac Enzymes: No results for input(s): CKTOTAL, CKMB, CKMBINDEX, TROPONINI in the last 168 hours. BNP: BNP (last 3 results) No results for input(s): BNP in the last 8760 hours.  ProBNP (last 3 results) No results for input(s): PROBNP in the last 8760 hours.  CBG: No results for input(s): GLUCAP in the last 168 hours.     Signed:  Ahamed Hofland  [facility] 07/01/2014, 12:00 PM

## 2014-07-01 NOTE — Progress Notes (Signed)
Patient discharged home.  IV removed - WNL.  Instructed on medications and importance of completing dose of antibiotics to prevent recurrence of PNA.  Instructed when to call MD.  Follow up in place. Verbalizes understanding.  No questions at this time, stable to DC home.  Left floor via WC with RN assist.

## 2014-07-15 DIAGNOSIS — J189 Pneumonia, unspecified organism: Secondary | ICD-10-CM | POA: Diagnosis not present

## 2014-07-15 DIAGNOSIS — D649 Anemia, unspecified: Secondary | ICD-10-CM | POA: Diagnosis not present

## 2014-07-15 DIAGNOSIS — E871 Hypo-osmolality and hyponatremia: Secondary | ICD-10-CM | POA: Diagnosis not present

## 2014-07-15 DIAGNOSIS — D72829 Elevated white blood cell count, unspecified: Secondary | ICD-10-CM | POA: Diagnosis not present

## 2014-07-15 DIAGNOSIS — Z23 Encounter for immunization: Secondary | ICD-10-CM | POA: Diagnosis not present

## 2014-07-29 DIAGNOSIS — R05 Cough: Secondary | ICD-10-CM | POA: Diagnosis not present

## 2014-08-19 DIAGNOSIS — J189 Pneumonia, unspecified organism: Secondary | ICD-10-CM | POA: Diagnosis not present

## 2014-09-03 DIAGNOSIS — H3531 Nonexudative age-related macular degeneration: Secondary | ICD-10-CM | POA: Diagnosis not present

## 2014-09-03 DIAGNOSIS — Z961 Presence of intraocular lens: Secondary | ICD-10-CM | POA: Diagnosis not present

## 2014-09-15 DIAGNOSIS — L821 Other seborrheic keratosis: Secondary | ICD-10-CM | POA: Diagnosis not present

## 2014-09-15 DIAGNOSIS — D225 Melanocytic nevi of trunk: Secondary | ICD-10-CM | POA: Diagnosis not present

## 2014-09-15 DIAGNOSIS — Z08 Encounter for follow-up examination after completed treatment for malignant neoplasm: Secondary | ICD-10-CM | POA: Diagnosis not present

## 2014-09-15 DIAGNOSIS — Z85820 Personal history of malignant melanoma of skin: Secondary | ICD-10-CM | POA: Diagnosis not present

## 2014-10-07 ENCOUNTER — Ambulatory Visit: Payer: PRIVATE HEALTH INSURANCE | Admitting: Physical Therapy

## 2014-10-09 ENCOUNTER — Ambulatory Visit: Payer: Medicare Other | Attending: Specialist | Admitting: Physical Therapy

## 2014-10-09 DIAGNOSIS — M545 Low back pain, unspecified: Secondary | ICD-10-CM

## 2014-10-09 DIAGNOSIS — R2681 Unsteadiness on feet: Secondary | ICD-10-CM | POA: Diagnosis not present

## 2014-10-09 NOTE — Therapy (Signed)
[facility] [place], [place], [address] Phone: [phone]   Fax:  [phone]  Physical Therapy Evaluation  Patient Details  Name: Thomas Vega MRN: [id number] Date of Birth: 1926/09/02 Referring Provider:  Jessy Oto, MD  Encounter Date: 10/09/2014      PT End of Session - 10/09/14 1425    Visit Number 1   Number of Visits 18   Date for PT Re-Evaluation 12/04/14   PT Start Time 0145   PT Stop Time 0228   PT Time Calculation (min) 43 min   Equipment Utilized During Treatment Other (comment)  Straight cane.   Activity Tolerance Patient tolerated treatment well      Past Medical History  Diagnosis Date  . Irregular heart rate   . Melanoma   . Prostate cancer   . Bradycardia 07/01/2014  . CAP (community acquired pneumonia) 06/29/2014  . Thrombocytopenia 06/30/2014    Past Surgical History  Procedure Laterality Date  . Back surgery    . Knee surgery      There were no vitals filed for this visit.  Visit Diagnosis:  Bilateral low back pain without sciatica - Plan: PT plan of care cert/re-cert  Unsteadiness - Plan: PT plan of care cert/re-cert      Subjective Assessment - 10/09/14 1356    Subjective Pain rise to a 7-8/10 after walking 15-20 minutes.   Patient Stated Goals Decrease pain and improve balance.            [facility] PT Assessment - 10/09/14 0001    Assessment   Medical Diagnosis Severe lumbar spinal stenosis.   Onset Date/Surgical Date --  Ongoing.   Precautions   Precautions Fall   Precaution Comments --  Constant cane usage.   Restrictions   Weight Bearing Restrictions No   Balance Screen   Has the patient fallen in the past 6 months Yes   How many times? 3-4 times but only when going without cane.   Has the patient had a decrease in activity level because of a fear of falling?  Yes   Is the patient reluctant to leave their home because of a fear of falling?  No   Home  [place] residence   Prior Function   Level of Independence Independent   Cognition   Overall Cognitive Status Within Functional Limits for tasks assessed   Observation/Other Assessments   Focus on Therapeutic Outcomes (FOTO)  57% limitation.   Posture/Postural Control   Posture/Postural Control Postural limitations   Postural Limitations Rounded Shoulders;Forward head;Decreased lumbar lordosis   Posture Comments The patient stands in 25 degrees of lumbar flexion.   ROM / Strength   AROM / PROM / Strength AROM;Strength   AROM   Overall AROM Comments Lumbar flexion decreased by 25% and patient -10 degrees from neutral lumbar positio.   Strength   Overall Strength Comments Bilateral LE strength= 4 to 4+/5.   Palpation   Palpation comment --  C/o pain in spine.   Ambulation/Gait   Gait Comments The patient ambulates with a straight cane and a wider base of support for safety.   Standardized Balance Assessment   Standardized Balance Assessment Berg Balance Test;Timed Up and Go Test   Berg Balance Test   Sit to Stand Able to stand without using hands and stabilize independently   Standing Unsupported Able to stand 2 minutes with supervision   Sitting with Back Unsupported but Feet Supported on Floor  or Stool Able to sit safely and securely 2 minutes   Stand to Sit Sits safely with minimal use of hands   Transfers Able to transfer safely, minor use of hands   Standing Unsupported with Eyes Closed Unable to keep eyes closed 3 seconds but stays steady   Standing Ubsupported with Feet Together Needs help to attain position but able to stand for 30 seconds with feet together   From Standing, Reach Forward with Outstretched Arm Can reach forward >12 cm safely (5")   From Standing Position, Pick up Object from Floor Able to pick up shoe, needs supervision   From Standing Position, Turn to Look Behind Over each Shoulder Looks behind one side only/other side shows  less weight shift   Turn 360 Degrees Needs close supervision or verbal cueing   Standing Unsupported, Alternately Place Feet on Step/Stool Able to complete >2 steps/needs minimal assist   Standing Unsupported, One Foot in Front Needs help to step but can hold 15 seconds   Standing on One Leg Tries to lift leg/unable to hold 3 seconds but remains standing independently   Total Score 34                   [facility] Adult PT Treatment/Exercise - 10/24/14 0001    Exercises   Exercises Knee/Hip   Knee/Hip Exercises: Aerobic   Nustep Level 3 x 15 minutes.                  PT Short Term Goals - 2014/10/24 1526    PT SHORT TERM GOAL #1   Title Ind with initial HEP.   Time 2   Period Weeks   Status New           PT Long Term Goals - 10-24-14 1526    PT LONG TERM GOAL #1   Title Ind with advanced HEP.   Time 6   Period Weeks   Status New   PT LONG TERM GOAL #2   Title Patient walk 30 minutes with pain not > 4/10.   Time 6   Period Weeks   Status New   PT LONG TERM GOAL #3   Title Improve Berg score to 48/56.   Time 6   Period Weeks   Status New               Plan - 10/24/14 1516    Clinical Impression Statement The patient reports increased low back pain with walking even after 20 minutes.  He also states that his legs tire which then affects his stability while walking.  His resting pain-level is a 3-4/10 and 7+/10 after walking for awhile.   Pt will benefit from skilled therapeutic intervention in order to improve on the following deficits Pain;Decreased range of motion   Rehab Potential Good   PT Frequency 3x / week   PT Duration 6 weeks   PT Treatment/Interventions ADLs/Self Care Home Management;Neuromuscular re-education;Therapeutic exercise;Gait training   PT Next Visit Plan Lumbar postural exercises and core exercies.  Parallel bar exercises such has rockerboard and stability training.  Resisted XTS           G-Codes - 2014/10/24 1424     Functional Limitation Mobility: Walking and moving around   Mobility: Walking and Moving Around Current Status [phone]) At least 40 percent but less than 60 percent impaired, limited or restricted   Mobility: Walking and Moving Around Goal Status (A2130) At least 20 percent but less than 40  percent impaired, limited or restricted       Problem List Patient Active Problem List   Diagnosis Date Noted  . Bradycardia 07/01/2014  . Thrombocytopenia 06/30/2014  . CAP (community acquired pneumonia) 06/29/2014  . Hypoxia 06/29/2014  . HTN (hypertension) 06/29/2014  . Prostate cancer 06/29/2014  . Hyponatremia 06/29/2014    Conal Shetley, [place] MPT 10/09/2014, 3:45 PM  [facility] [address] [place], [place], [address] Phone: [phone]   Fax:  [phone]

## 2014-10-14 ENCOUNTER — Encounter: Payer: Self-pay | Admitting: *Deleted

## 2014-10-14 ENCOUNTER — Ambulatory Visit: Payer: Medicare Other | Admitting: *Deleted

## 2014-10-14 DIAGNOSIS — M545 Low back pain, unspecified: Secondary | ICD-10-CM

## 2014-10-14 DIAGNOSIS — R2681 Unsteadiness on feet: Secondary | ICD-10-CM | POA: Diagnosis not present

## 2014-10-14 NOTE — Therapy (Signed)
[facility] [place], [place], [address] Phone: [phone]   Fax:  [phone]  Physical Therapy Treatment  Patient Details  Name: Thomas Vega MRN: [id number] Date of Birth: [birth year] Referring Provider:  Jani Gravel, MD  Encounter Date: 10/14/2014      PT End of Session - 10/14/14 1512    Visit Number 2   Number of Visits 18   Date for PT Re-Evaluation 12/04/14   PT Start Time 6073   PT Stop Time 1603   PT Time Calculation (min) 48 min      Past Medical History  Diagnosis Date  . Irregular heart rate   . Melanoma   . Prostate cancer   . Bradycardia 07/01/2014  . CAP (community acquired pneumonia) 06/29/2014  . Thrombocytopenia 06/30/2014    Past Surgical History  Procedure Laterality Date  . Back surgery    . Knee surgery      There were no vitals filed for this visit.  Visit Diagnosis:  Bilateral low back pain without sciatica  Unsteadiness      Subjective Assessment - 10/14/14 1510    Subjective Pain rise to a 7-8/10 after walking 15-20 minutes.   Patient Stated Goals Decrease pain and improve balance.   Currently in Pain? Yes   Pain Score 4    Pain Location Back                         [facility] Adult PT Treatment/Exercise - 10/14/14 0001    Exercises   Exercises Knee/Hip;Lumbar   Lumbar Exercises: Supine   Ab Set 10 reps;5 seconds  with Drawin   Bent Knee Raise 20 reps;2 seconds   Knee/Hip Exercises: Aerobic   Nustep Level 5 x 20 minutes.with focus on Core activation with  Drawin   Knee/Hip Exercises: Standing   Forward Step Up Both;2 sets;10 reps;Step Height: 6"   Rocker Board 5 minutes  calf stretching and balance with SBA   Other Standing Knee Exercises XTS, pink band,,resisted walking backwards with SBA/CGA                  PT Short Term Goals - 10/09/14 1526    PT SHORT TERM GOAL #1   Title Ind with initial HEP.   Time 2   Period Weeks   Status New            PT Long Term Goals - 10/09/14 1526    PT LONG TERM GOAL #1   Title Ind with advanced HEP.   Time 6   Period Weeks   Status New   PT LONG TERM GOAL #2   Title Patient walk 30 minutes with pain not > 4/10.   Time 6   Period Weeks   Status New   PT LONG TERM GOAL #3   Title Improve Berg score to 48/56.   Time 6   Period Weeks   Status New               Plan - 10/14/14 1513    Clinical Impression Statement pt tolerated Rx fairly well today and was able to perform strengthening and balance exs. He was able to demonstrate a good draw-in for core activation after verbal and tactile cues were given. Pt needs SBA  or CGA during Act.'s due to balance deficits.   Pt will benefit from skilled therapeutic intervention in order to improve on the following deficits Pain;Decreased range of motion  Rehab Potential Good   PT Frequency 3x / week   PT Treatment/Interventions ADLs/Self Care Home Management;Neuromuscular re-education;Therapeutic exercise;Gait training   PT Next Visit Plan Lumbar postural exercises and core exercies.  Parallel bar exercises such has rockerboard and stability training.  Resisted XTS    Consulted and Agree with Plan of Care Patient        Problem List Patient Active Problem List   Diagnosis Date Noted  . Bradycardia 07/01/2014  . Thrombocytopenia 06/30/2014  . CAP (community acquired pneumonia) 06/29/2014  . Hypoxia 06/29/2014  . HTN (hypertension) 06/29/2014  . Prostate cancer 06/29/2014  . Hyponatremia 06/29/2014    RAMSEUR,CHRIS, PTA 10/14/2014, 5:38 PM  [facility] [address] [place], [place], [address] Phone: [phone]   Fax:  [phone]

## 2014-10-16 ENCOUNTER — Ambulatory Visit: Payer: Medicare Other | Admitting: *Deleted

## 2014-10-16 ENCOUNTER — Encounter: Payer: Self-pay | Admitting: *Deleted

## 2014-10-16 DIAGNOSIS — M545 Low back pain, unspecified: Secondary | ICD-10-CM

## 2014-10-16 DIAGNOSIS — R2681 Unsteadiness on feet: Secondary | ICD-10-CM | POA: Diagnosis not present

## 2014-10-16 NOTE — Therapy (Signed)
[facility] [place], [place], [address] Phone: [phone]   Fax:  [phone]  Physical Therapy Treatment  Patient Details  Name: Thomas Vega MRN: [id number] Date of Birth: 08/04/1926 Referring Provider:  Jani Gravel, MD  Encounter Date: 10/16/2014      PT End of Session - 10/16/14 1441    Visit Number 3   Number of Visits 18   Date for PT Re-Evaluation 12/04/14   PT Start Time 1430   PT Stop Time 3810   PT Time Calculation (min) 47 min      Past Medical History  Diagnosis Date  . Irregular heart rate   . Melanoma   . Prostate cancer   . Bradycardia 07/01/2014  . CAP (community acquired pneumonia) 06/29/2014  . Thrombocytopenia 06/30/2014    Past Surgical History  Procedure Laterality Date  . Back surgery    . Knee surgery      There were no vitals filed for this visit.  Visit Diagnosis:  Bilateral low back pain without sciatica  Unsteadiness      Subjective Assessment - 10/16/14 1435    Subjective Did good after last Rx and had a little muscle soreness.   Patient Stated Goals Decrease pain and improve balance.   Currently in Pain? Yes   Pain Score 3    Pain Location Back   Pain Descriptors / Indicators Aching   Pain Onset More than a month ago   Pain Frequency Intermittent   Aggravating Factors  standing or walking a while   Pain Relieving Factors rest                         [facility] Adult PT Treatment/Exercise - 10/16/14 0001    Exercises   Exercises Knee/Hip;Lumbar   Knee/Hip Exercises: Aerobic   Nustep Level 5 x 20 minutes.with focus on Core activation with  Drawin   Knee/Hip Exercises: Standing   Forward Step Up Both;2 sets;10 reps;Step Height: 6"   Rocker Board 5 minutes  calf stretching and balance with SBA   Other Standing Knee Exercises XTS, pink band,,resisted walking backwards, LT side stepping and RTY side stepping all x 15 each way  with SBA/CGA                                                                                                           SLS RT and LT LE's              PT Short Term Goals - 10/09/14 1526    PT SHORT TERM GOAL #1   Title Ind with initial HEP.   Time 2   Period Weeks   Status New           PT Long Term Goals - 10/09/14 1526    PT LONG TERM GOAL #1   Title Ind with advanced HEP.   Time 6   Period Weeks   Status New   PT LONG TERM GOAL #2   Title Patient walk 30  minutes with pain not > 4/10.   Time 6   Period Weeks   Status New   PT LONG TERM GOAL #3   Title Improve Berg score to 48/56.   Time 6   Period Weeks   Status New               Plan - 10/16/14 1442    Clinical Impression Statement Pt did fairly well with Rx today and felt that he did better on the balance Act.'s. He still needs SBA/CGA with all Act.'s due to balance deficit. He did well on resisted walking and was able to adjust his steps to prevent LOB.  Goals are ongoing at this point   Pt will benefit from skilled therapeutic intervention in order to improve on the following deficits Pain;Decreased range of motion   Rehab Potential Good   PT Frequency 3x / week   PT Duration 6 weeks   PT Treatment/Interventions ADLs/Self Care Home Management;Neuromuscular re-education;Therapeutic exercise;Gait training   PT Next Visit Plan Lumbar postural exercises and core exercies.  Parallel bar exercises such has rockerboard and stability training.  Resisted XTS         Problem List Patient Active Problem List   Diagnosis Date Noted  . Bradycardia 07/01/2014  . Thrombocytopenia 06/30/2014  . CAP (community acquired pneumonia) 06/29/2014  . Hypoxia 06/29/2014  . HTN (hypertension) 06/29/2014  . Prostate cancer 06/29/2014  . Hyponatremia 06/29/2014    Zakiya Sporrer,CHRIS, PTA 10/16/2014, 4:10 PM  [facility] [address] [place], [place], [address] Phone: [phone]   Fax:   [phone]

## 2014-10-21 ENCOUNTER — Encounter: Payer: Self-pay | Admitting: Physical Therapy

## 2014-10-21 ENCOUNTER — Ambulatory Visit: Payer: Medicare Other | Admitting: Physical Therapy

## 2014-10-21 DIAGNOSIS — R2681 Unsteadiness on feet: Secondary | ICD-10-CM

## 2014-10-21 DIAGNOSIS — M545 Low back pain, unspecified: Secondary | ICD-10-CM

## 2014-10-21 NOTE — Therapy (Signed)
[facility] [place], [place], [address] Phone: [phone]   Fax:  [phone]  Physical Therapy Treatment  Patient Details  Name: Thomas Vega MRN: [id number] Date of Birth: 11-22-1926 Referring Provider:  Jani Gravel, MD  Encounter Date: 10/21/2014      PT End of Session - 10/21/14 1358    Visit Number 4   Number of Visits 18   Date for PT Re-Evaluation 12/04/14   PT Start Time 4431   PT Stop Time 1359   PT Time Calculation (min) 46 min   Activity Tolerance Patient tolerated treatment well   Behavior During Therapy [facility] for tasks assessed/performed      Past Medical History  Diagnosis Date  . Irregular heart rate   . Melanoma   . Prostate cancer   . Bradycardia 07/01/2014  . CAP (community acquired pneumonia) 06/29/2014  . Thrombocytopenia 06/30/2014    Past Surgical History  Procedure Laterality Date  . Back surgery    . Knee surgery      There were no vitals filed for this visit.  Visit Diagnosis:  Bilateral low back pain without sciatica  Unsteadiness      Subjective Assessment - 10/21/14 1323    Subjective no complints after last treatment   Patient Stated Goals Decrease pain and improve balance.   Currently in Pain? Yes   Pain Score 3    Pain Location Back   Pain Orientation Lower   Pain Descriptors / Indicators Aching   Pain Onset More than a month ago   Pain Frequency Intermittent   Aggravating Factors  prolong standing or walking   Pain Relieving Factors rest            [facility] PT Assessment - 10/21/14 0001    Berg Balance Test   Sit to Stand Able to stand without using hands and stabilize independently   Standing Unsupported Able to stand safely 2 minutes   Sitting with Back Unsupported but Feet Supported on Floor or Stool Able to sit safely and securely 2 minutes   Stand to Sit Sits safely with minimal use of hands   Transfers Able to transfer safely, minor use of hands   Standing  Unsupported with Eyes Closed Able to stand 3 seconds   Standing Ubsupported with Feet Together Able to place feet together independently and stand for 1 minute with supervision   From Standing, Reach Forward with Outstretched Arm Can reach forward >12 cm safely (5")   From Standing Position, Pick up Object from Floor Able to pick up shoe, needs supervision   From Standing Position, Turn to Look Behind Over each Shoulder Looks behind one side only/other side shows less weight shift   Turn 360 Degrees Able to turn 360 degrees safely but slowly   Standing Unsupported, Alternately Place Feet on Step/Stool Able to complete 4 steps without aid or supervision   Standing Unsupported, One Foot in Front Able to take small step independently and hold 30 seconds   Standing on One Leg Tries to lift leg/unable to hold 3 seconds but remains standing independently   Total Score 41                     [facility] Adult PT Treatment/Exercise - 10/21/14 0001    Lumbar Exercises: Supine   Bridge 20 reps;2 seconds   Knee/Hip Exercises: Aerobic   Nustep Level 5 x 17 minutes.with focus on Core activation with  Drawin   Knee/Hip  Exercises: Standing   Forward Step Up Both;2 sets;10 reps;Step Height: 6"   Rocker Board --             Balance Exercises - 10/21/14 1353    Balance Exercises: Standing   Standing Eyes Opened 2 reps   Standing Eyes Closed 1 rep   Tandem Stance Eyes open;2 reps   SLS Eyes open;1 rep   Rockerboard --  39min   Step Ups 6 inch  2x10   Other Standing Exercises pink XTS for resitsted walking 4 ways x 10 each way             PT Short Term Goals - 10/21/14 1359    PT SHORT TERM GOAL #1   Title Ind with initial HEP.   Time 2   Period Weeks   Status On-going           PT Long Term Goals - 10/21/14 1359    PT LONG TERM GOAL #1   Title Ind with advanced HEP.   Time 6   Period Weeks   Status On-going   PT LONG TERM GOAL #2   Title Patient walk 30 minutes  with pain not > 4/10.   Time 6   Period Weeks   Status On-going   PT LONG TERM GOAL #3   Title Improve Berg score to 48/56.   Time 6   Period Weeks   Status On-going  41/56               Plan - 10/21/14 1400    Clinical Impression Statement Patient did very well today with no reports of pain in back and no falls reported. Patient improved with BERG balance score today from 34 to 41/56. Unable to meet any further goals due to pain and balance deficits.   Pt will benefit from skilled therapeutic intervention in order to improve on the following deficits Pain;Decreased range of motion   Rehab Potential Good   PT Frequency 3x / week   PT Duration 6 weeks   PT Treatment/Interventions ADLs/Self Care Home Management;Neuromuscular re-education;Therapeutic exercise;Gait training   PT Next Visit Plan Lumbar postural exercises and core exercies.  Parallel bar exercises such has rockerboard and stability training.  Resisted XTS    PT Home Exercise Plan issue HEP   Consulted and Agree with Plan of Care Patient        Problem List Patient Active Problem List   Diagnosis Date Noted  . Bradycardia 07/01/2014  . Thrombocytopenia 06/30/2014  . CAP (community acquired pneumonia) 06/29/2014  . Hypoxia 06/29/2014  . HTN (hypertension) 06/29/2014  . Prostate cancer 06/29/2014  . Hyponatremia 06/29/2014    DUNFORD, CHRISTINA P, PTA 10/21/2014, 2:05 PM  [facility] [place], [place], [address] Phone: [phone]   Fax:  [phone]

## 2014-10-23 ENCOUNTER — Ambulatory Visit: Payer: Medicare Other | Admitting: *Deleted

## 2014-10-23 ENCOUNTER — Encounter: Payer: Self-pay | Admitting: *Deleted

## 2014-10-23 DIAGNOSIS — R2681 Unsteadiness on feet: Secondary | ICD-10-CM

## 2014-10-23 DIAGNOSIS — M545 Low back pain, unspecified: Secondary | ICD-10-CM

## 2014-10-23 NOTE — Therapy (Signed)
[facility] [place], [place], [address] Phone: [phone]   Fax:  [phone]  Physical Therapy Treatment  Patient Details  Name: Thomas Vega MRN: [id number] Date of Birth: December 31, 1926 Referring Provider:  Jani Gravel, MD  Encounter Date: 10/23/2014      PT End of Session - 10/23/14 1657    Visit Number 5   Number of Visits 18   Date for PT Re-Evaluation 12/04/14   PT Start Time 1600   PT Stop Time 1651   PT Time Calculation (min) 51 min      Past Medical History  Diagnosis Date  . Irregular heart rate   . Melanoma   . Prostate cancer   . Bradycardia 07/01/2014  . CAP (community acquired pneumonia) 06/29/2014  . Thrombocytopenia 06/30/2014    Past Surgical History  Procedure Laterality Date  . Back surgery    . Knee surgery      There were no vitals filed for this visit.  Visit Diagnosis:  Bilateral low back pain without sciatica  Unsteadiness      Subjective Assessment - 10/23/14 1623    Subjective no complints after last treatment. Balance is doing better   Patient Stated Goals Decrease pain and improve balance.   Currently in Pain? No/denies   Pain Location Back   Pain Orientation Lower   Pain Descriptors / Indicators Aching   Pain Onset More than a month ago   Pain Frequency Intermittent   Aggravating Factors  prolonged standing and walking                         [facility] Adult PT Treatment/Exercise - 10/23/14 0001    Balance   Balance Assessed Yes   Exercises   Exercises Knee/Hip;Lumbar   Knee/Hip Exercises: Aerobic   Nustep Level 5 x 20 minutes.with focus on Core activation with  Drawin   Knee/Hip Exercises: Standing   Forward Step Up Both;2 sets;10 reps;Step Height: 6"   Lunge Walking - Round Trips one step holds for LT and RT LE x 10 each   Rocker Board 3 minutes   Other Standing Knee Exercises XTS, pink band,,resisted walking backwards, LT side stepping and RTY side  stepping all x 15 each way  with SBA/CGA   Other Standing Knee Exercises inverted BOSU balance x 3 mins with SBA/CGA                  PT Short Term Goals - 10/21/14 1359    PT SHORT TERM GOAL #1   Title Ind with initial HEP.   Time 2   Period Weeks   Status On-going           PT Long Term Goals - 10/21/14 1359    PT LONG TERM GOAL #1   Title Ind with advanced HEP.   Time 6   Period Weeks   Status On-going   PT LONG TERM GOAL #2   Title Patient walk 30 minutes with pain not > 4/10.   Time 6   Period Weeks   Status On-going   PT LONG TERM GOAL #3   Title Improve Berg score to 48/56.   Time 6   Period Weeks   Status On-going  41/56               Plan - 10/23/14 1705    Clinical Impression Statement Pt did good today with Rx today and was able to perform  all exs without increase in LB pain. He still needs SBA/CGA with all balance Exs. He was challenged with one step holds and compensated by ER his hip to broaden his BOS. goals are ongoing.   Pt will benefit from skilled therapeutic intervention in order to improve on the following deficits Pain;Decreased range of motion   Rehab Potential Good   PT Frequency 3x / week   PT Duration 6 weeks   PT Next Visit Plan Lumbar postural exercises and core exercies.  Parallel bar exercises such has rockerboard and stability training.  Resisted XTS           G-Codes - Oct 31, 2014 1720    Functional Assessment Tool Used 5th visit FOTO 50% limitation      Problem List Patient Active Problem List   Diagnosis Date Noted  . Bradycardia 07/01/2014  . Thrombocytopenia 06/30/2014  . CAP (community acquired pneumonia) 06/29/2014  . Hypoxia 06/29/2014  . HTN (hypertension) 06/29/2014  . Prostate cancer 06/29/2014  . Hyponatremia 06/29/2014    Rian Koon,CHRIS, PTA 10-31-14, 5:22 PM  [facility] [address] [place], [place], [address] Phone: [phone]   Fax:   [phone]

## 2014-10-27 ENCOUNTER — Ambulatory Visit: Payer: Medicare Other | Attending: Specialist | Admitting: *Deleted

## 2014-10-27 ENCOUNTER — Encounter: Payer: Self-pay | Admitting: *Deleted

## 2014-10-27 DIAGNOSIS — M545 Low back pain, unspecified: Secondary | ICD-10-CM

## 2014-10-27 DIAGNOSIS — R2681 Unsteadiness on feet: Secondary | ICD-10-CM | POA: Insufficient documentation

## 2014-10-27 NOTE — Therapy (Signed)
[facility] [place], [place], [address] Phone: [phone]   Fax:  [phone]  Physical Therapy Treatment  Patient Details  Name: Thomas Vega MRN: [id number] Date of Birth: [birth year] Referring Provider:  Jani Gravel, MD  Encounter Date: 10/27/2014      PT End of Session - 10/27/14 1458    Visit Number 6   Number of Visits 18   Date for PT Re-Evaluation 12/04/14   PT Start Time 1430   PT Stop Time 1520   PT Time Calculation (min) 50 min      Past Medical History  Diagnosis Date  . Irregular heart rate   . Melanoma   . Prostate cancer   . Bradycardia 07/01/2014  . CAP (community acquired pneumonia) 06/29/2014  . Thrombocytopenia 06/30/2014    Past Surgical History  Procedure Laterality Date  . Back surgery    . Knee surgery      There were no vitals filed for this visit.  Visit Diagnosis:  Bilateral low back pain without sciatica  Unsteadiness      Subjective Assessment - 10/27/14 1500    Subjective no complints after last treatment. Balance is doing better . LBP is minimal today. Work on balance   Patient Stated Goals Decrease pain and improve balance.   Currently in Pain? No/denies                         [facility] Adult PT Treatment/Exercise - 10/27/14 0001    Knee/Hip Exercises: Aerobic   Nustep Level 5 x 20 minutes.with focus on Core activation with  Drawin   Knee/Hip Exercises: Standing   Forward Step Up Both;3 sets;10 reps   Lunge Walking - Round Trips one step holds for LT and RT LE x 10 each   Rocker Board 3 minutes   Other Standing Knee Exercises XTS, pink band,,resisted walking backwards, LT side stepping and RTY side stepping all x 10 each way  with SBA/CGA   Other Standing Knee Exercises                                                                                                     Inverted BOSU x92mins              PT Short Term Goals - 10/21/14 1359    PT  SHORT TERM GOAL #1   Title Ind with initial HEP.   Time 2   Period Weeks   Status On-going           PT Long Term Goals - 10/21/14 1359    PT LONG TERM GOAL #1   Title Ind with advanced HEP.   Time 6   Period Weeks   Status On-going   PT LONG TERM GOAL #2   Title Patient walk 30 minutes with pain not > 4/10.   Time 6   Period Weeks   Status On-going   PT LONG TERM GOAL #3   Title Improve Berg score to 48/56.   Time 6   Period Weeks  Status On-going  41/56               Plan - 10/27/14 1459    Clinical Impression Statement Pt was able to perform all balance ex.'s and act.'s today without increased LBP. Pt continues to progress with balance control,but one step holds and steps are still challenging for him. He still needs SBA/ CGA on all Act.'s. Goals are ongoing   Pt will benefit from skilled therapeutic intervention in order to improve on the following deficits Pain;Decreased range of motion   Rehab Potential Good   PT Frequency 3x / week   PT Duration 6 weeks   PT Treatment/Interventions ADLs/Self Care Home Management;Neuromuscular re-education;Therapeutic exercise;Gait training   PT Next Visit Plan Lumbar postural exercises and core exercies.  Parallel bar exercises such has rockerboard and stability training.  Resisted XTS    Consulted and Agree with Plan of Care Patient        Problem List Patient Active Problem List   Diagnosis Date Noted  . Bradycardia 07/01/2014  . Thrombocytopenia 06/30/2014  . CAP (community acquired pneumonia) 06/29/2014  . Hypoxia 06/29/2014  . HTN (hypertension) 06/29/2014  . Prostate cancer 06/29/2014  . Hyponatremia 06/29/2014    Deiondre Harrower,CHRIS, PTA 10/27/2014, 3:37 PM  [facility] [address] [place], [place], [address] Phone: [phone]   Fax:  [phone]

## 2014-10-29 ENCOUNTER — Ambulatory Visit: Payer: Medicare Other | Admitting: Physical Therapy

## 2014-10-29 DIAGNOSIS — M545 Low back pain, unspecified: Secondary | ICD-10-CM

## 2014-10-29 DIAGNOSIS — R2681 Unsteadiness on feet: Secondary | ICD-10-CM | POA: Diagnosis not present

## 2014-10-29 NOTE — Therapy (Signed)
[facility] [place], [place], [address] Phone: [phone]   Fax:  [phone]  Physical Therapy Treatment  Patient Details  Name: Thomas Vega MRN: [id number] Date of Birth: 1926-05-21 Referring Provider:  Jani Gravel, MD  Encounter Date: 10/29/2014      PT End of Session - 10/29/14 1607    Visit Number 7   Number of Visits 18   Date for PT Re-Evaluation 12/04/14   PT Start Time 0158   PT Stop Time 0224   PT Time Calculation (min) 26 min   Activity Tolerance Patient tolerated treatment well      Past Medical History  Diagnosis Date  . Irregular heart rate   . Melanoma   . Prostate cancer   . Bradycardia 07/01/2014  . CAP (community acquired pneumonia) 06/29/2014  . Thrombocytopenia 06/30/2014    Past Surgical History  Procedure Laterality Date  . Back surgery    . Knee surgery      There were no vitals filed for this visit.  Visit Diagnosis:  Bilateral low back pain without sciatica  Unsteadiness      Subjective Assessment - 10/29/14 1603    Subjective Running late today.                                   PT Short Term Goals - 10/21/14 1359    PT SHORT TERM GOAL #1   Title Ind with initial HEP.   Time 2   Period Weeks   Status On-going           PT Long Term Goals - 10/21/14 1359    PT LONG TERM GOAL #1   Title Ind with advanced HEP.   Time 6   Period Weeks   Status On-going   PT LONG TERM GOAL #2   Title Patient walk 30 minutes with pain not > 4/10.   Time 6   Period Weeks   Status On-going   PT LONG TERM GOAL #3   Title Improve Berg score to 48/56.   Time 6   Period Weeks   Status On-going  41/56     Rockerboard; inverted BOSU and regular BOSU for neuro-re-edu. (15 minutes) and resisted 4 way walking with red XTS x 8 minutes.          Problem List Patient Active Problem List   Diagnosis Date Noted  . Bradycardia 07/01/2014  .  Thrombocytopenia 06/30/2014  . CAP (community acquired pneumonia) 06/29/2014  . Hypoxia 06/29/2014  . HTN (hypertension) 06/29/2014  . Prostate cancer 06/29/2014  . Hyponatremia 06/29/2014    Deantae Shackleton, [place] MPT 10/29/2014, 4:09 PM  [facility] [address] [place], [place], [address] Phone: [phone]   Fax:  [phone]

## 2014-10-30 DIAGNOSIS — I10 Essential (primary) hypertension: Secondary | ICD-10-CM | POA: Diagnosis not present

## 2014-10-30 DIAGNOSIS — E559 Vitamin D deficiency, unspecified: Secondary | ICD-10-CM | POA: Diagnosis not present

## 2014-11-03 ENCOUNTER — Encounter: Payer: Self-pay | Admitting: Physical Therapy

## 2014-11-03 ENCOUNTER — Ambulatory Visit: Payer: Medicare Other | Admitting: Physical Therapy

## 2014-11-03 DIAGNOSIS — M545 Low back pain, unspecified: Secondary | ICD-10-CM

## 2014-11-03 DIAGNOSIS — R2681 Unsteadiness on feet: Secondary | ICD-10-CM

## 2014-11-03 NOTE — Therapy (Signed)
[facility] [place], [place], [address] Phone: [phone]   Fax:  [phone]  Physical Therapy Treatment  Patient Details  Name: Thomas Vega MRN: [id number] Date of Birth: January 13, 1927 Referring Provider:  Jani Gravel, MD  Encounter Date: 11/03/2014      PT End of Session - 11/03/14 1430    Visit Number 8   Number of Visits 18   Date for PT Re-Evaluation 12/04/14   PT Start Time 1430   PT Stop Time 1516   PT Time Calculation (min) 46 min   Activity Tolerance Patient tolerated treatment well   Behavior During Therapy [facility] for tasks assessed/performed      Past Medical History  Diagnosis Date  . Irregular heart rate   . Melanoma   . Prostate cancer   . Bradycardia 07/01/2014  . CAP (community acquired pneumonia) 06/29/2014  . Thrombocytopenia 06/30/2014    Past Surgical History  Procedure Laterality Date  . Back surgery    . Knee surgery      There were no vitals filed for this visit.  Visit Diagnosis:  Bilateral low back pain without sciatica  Unsteadiness      Subjective Assessment - 11/03/14 1432    Subjective Reports "a little rumble in his back today."   Patient Stated Goals Decrease pain and improve balance.   Currently in Pain? No/denies            [facility] PT Assessment - 11/03/14 0001    Assessment   Medical Diagnosis Severe lumbar spinal stenosis.                     [facility] Adult PT Treatment/Exercise - 11/03/14 0001    Lumbar Exercises: Aerobic   Stationary Bike NuStep L5 x20 min             Balance Exercises - 11/03/14 1450    Balance Exercises: Standing   Rockerboard Lateral;Other time (comment)  x3 min   Balance Beam Forward/ backward x6 RT with fingertip use; sidestepping x6 RT with fingertip use   Other Standing Exercises DLS on reg. BOSU and inverted BOSU intermittant UE use x4 min each; 4 way Pink XTS walking x6 reps each direction             PT  Short Term Goals - 10/21/14 1359    PT SHORT TERM GOAL #1   Title Ind with initial HEP.   Time 2   Period Weeks   Status On-going           PT Long Term Goals - 10/21/14 1359    PT LONG TERM GOAL #1   Title Ind with advanced HEP.   Time 6   Period Weeks   Status On-going   PT LONG TERM GOAL #2   Title Patient walk 30 minutes with pain not > 4/10.   Time 6   Period Weeks   Status On-going   PT LONG TERM GOAL #3   Title Improve Berg score to 48/56.   Time 6   Period Weeks   Status On-going  41/56               Plan - 11/03/14 1520    Clinical Impression Statement Patient tolerated treatment well without complaint of increased pain. Required CGA for all balance exercises. Continues to have to use UE for balance exercises. Goals remain on-going secondary to decreased balance and pain. Denied pain following treatment.   Pt will  benefit from skilled therapeutic intervention in order to improve on the following deficits Pain;Decreased range of motion   Rehab Potential Good   PT Frequency 3x / week   PT Duration 6 weeks   PT Treatment/Interventions ADLs/Self Care Home Management;Neuromuscular re-education;Therapeutic exercise;Gait training   PT Next Visit Plan Lumbar postural exercises and core exercies.  Parallel bar exercises such has rockerboard and stability training.  Resisted XTS    Consulted and Agree with Plan of Care Patient        Problem List Patient Active Problem List   Diagnosis Date Noted  . Bradycardia 07/01/2014  . Thrombocytopenia 06/30/2014  . CAP (community acquired pneumonia) 06/29/2014  . Hypoxia 06/29/2014  . HTN (hypertension) 06/29/2014  . Prostate cancer 06/29/2014  . Hyponatremia 06/29/2014    Wynelle Fanny, PTA 11/03/2014, 3:22 PM  [facility] [address] [place], [place], [address] Phone: [phone]   Fax:  [phone]

## 2014-11-05 ENCOUNTER — Encounter: Payer: PRIVATE HEALTH INSURANCE | Admitting: Physical Therapy

## 2014-11-13 ENCOUNTER — Encounter: Payer: Self-pay | Admitting: Physical Therapy

## 2014-11-13 ENCOUNTER — Ambulatory Visit: Payer: Medicare Other | Admitting: Physical Therapy

## 2014-11-13 DIAGNOSIS — M545 Low back pain, unspecified: Secondary | ICD-10-CM

## 2014-11-13 DIAGNOSIS — R2681 Unsteadiness on feet: Secondary | ICD-10-CM

## 2014-11-13 NOTE — Therapy (Signed)
[facility] [place], [place], [address] Phone: [phone]   Fax:  [phone]  Physical Therapy Treatment  Patient Details  Name: Thomas Vega MRN: [id number] Date of Birth: 1926/06/04 Referring Provider:  Jani Gravel, MD  Encounter Date: 11/13/2014      PT End of Session - 11/13/14 1436    Visit Number 9   Number of Visits 18   Date for PT Re-Evaluation 12/04/14   PT Start Time 3159   PT Stop Time 1527   PT Time Calculation (min) 53 min   Activity Tolerance Patient tolerated treatment well   Behavior During Therapy [facility] for tasks assessed/performed      Past Medical History  Diagnosis Date  . Irregular heart rate   . Melanoma   . Prostate cancer   . Bradycardia 07/01/2014  . CAP (community acquired pneumonia) 06/29/2014  . Thrombocytopenia 06/30/2014    Past Surgical History  Procedure Laterality Date  . Back surgery    . Knee surgery      There were no vitals filed for this visit.  Visit Diagnosis:  Bilateral low back pain without sciatica  Unsteadiness      Subjective Assessment - 11/13/14 1435    Subjective Feels pretty good today per patient reports although his back hurts "a little bit."   Patient Stated Goals Decrease pain and improve balance.   Currently in Pain? Yes   Pain Score 3    Pain Location Back   Pain Orientation Lower   Pain Descriptors / Indicators Other (Comment)  "a grumble" per patient report.   Pain Onset More than a month ago            [facility] PT Assessment - 11/13/14 0001    Assessment   Medical Diagnosis Severe lumbar spinal stenosis.                     [facility] Adult PT Treatment/Exercise - 11/13/14 0001    Lumbar Exercises: Aerobic   Stationary Bike NuStep L5 x20 min             Balance Exercises - 11/13/14 1529    Balance Exercises: Standing   Rockerboard Lateral;Other time (comment)  x3 min   Balance Beam Forward x3 reps, sidestep x4 reps    Other Standing Exercises DLS on reg. BOSU x6 min; 4 way Pink XTS walking x6 reps each direction; DLS airex stance with reachouts and D2 x20 reps each             PT Short Term Goals - 10/21/14 1359    PT SHORT TERM GOAL #1   Title Ind with initial HEP.   Time 2   Period Weeks   Status On-going           PT Long Term Goals - 10/21/14 1359    PT LONG TERM GOAL #1   Title Ind with advanced HEP.   Time 6   Period Weeks   Status On-going   PT LONG TERM GOAL #2   Title Patient walk 30 minutes with pain not > 4/10.   Time 6   Period Weeks   Status On-going   PT LONG TERM GOAL #3   Title Improve Berg score to 48/56.   Time 6   Period Weeks   Status On-going  41/56               Plan - 11/13/14 1531    Clinical Impression Statement  Patient tolerated treamtent well without complaint of increased pain. Required supervision or CGA for all balance exercises. Continues to have to use UE support intermittantly for balance exercises. Goals remain on-going at this time secondary to balance and pain. Experienced "the same pain, its not worse" following therapy.   Pt will benefit from skilled therapeutic intervention in order to improve on the following deficits Pain;Decreased range of motion   Rehab Potential Good   PT Frequency 3x / week   PT Duration 6 weeks   PT Treatment/Interventions ADLs/Self Care Home Management;Neuromuscular re-education;Therapeutic exercise;Gait training   PT Next Visit Plan Lumbar postural exercises and core exercies.  Parallel bar exercises such has rockerboard and stability training.  Resisted XTS    Consulted and Agree with Plan of Care Patient        Problem List Patient Active Problem List   Diagnosis Date Noted  . Bradycardia 07/01/2014  . Thrombocytopenia 06/30/2014  . CAP (community acquired pneumonia) 06/29/2014  . Hypoxia 06/29/2014  . HTN (hypertension) 06/29/2014  . Prostate cancer 06/29/2014  . Hyponatremia 06/29/2014     Wynelle Fanny, PTA 11/13/2014, 3:40 PM  [facility] [address] [place], [place], [address] Phone: [phone]   Fax:  [phone]

## 2014-11-18 ENCOUNTER — Encounter: Payer: Self-pay | Admitting: Physical Therapy

## 2014-11-18 ENCOUNTER — Ambulatory Visit: Payer: Medicare Other | Admitting: Physical Therapy

## 2014-11-18 DIAGNOSIS — R2681 Unsteadiness on feet: Secondary | ICD-10-CM

## 2014-11-18 DIAGNOSIS — M545 Low back pain, unspecified: Secondary | ICD-10-CM

## 2014-11-18 NOTE — Therapy (Signed)
[facility] [place], [place], [address] Phone: [phone]   Fax:  [phone]  Physical Therapy Treatment  Patient Details  Name: Thomas Vega MRN: [id number] Date of Birth: [birth year] Referring Provider:  Jani Gravel, MD  Encounter Date: 11/18/2014      PT End of Session - 11/18/14 1347    Visit Number 10   Number of Visits 18   Date for PT Re-Evaluation 12/04/14   PT Start Time 7371   PT Stop Time 1430   PT Time Calculation (min) 47 min   Equipment Utilized During Treatment Other (comment)  SPC   Activity Tolerance Patient tolerated treatment well   Behavior During Therapy [facility] for tasks assessed/performed      Past Medical History  Diagnosis Date  . Irregular heart rate   . Melanoma   . Prostate cancer   . Bradycardia 07/01/2014  . CAP (community acquired pneumonia) 06/29/2014  . Thrombocytopenia 06/30/2014    Past Surgical History  Procedure Laterality Date  . Back surgery    . Knee surgery      There were no vitals filed for this visit.  Visit Diagnosis:  Bilateral low back pain without sciatica  Unsteadiness      Subjective Assessment - 11/18/14 1346    Subjective Reports no back pain only a little R knee pain which "is to be expected."   Patient Stated Goals Decrease pain and improve balance.   Currently in Pain? No/denies            [facility] PT Assessment - 11/18/14 0001    Assessment   Medical Diagnosis Severe lumbar spinal stenosis.                     [facility] Adult PT Treatment/Exercise - 11/18/14 0001    Lumbar Exercises: Aerobic   Stationary Bike NuStep L6 x20 min             Balance Exercises - 11/18/14 1416    Balance Exercises: Standing   Rockerboard Lateral;Other time (comment);Intermittent UE support  x3 min   Balance Beam Forward x4 reps, sidestep x4 reps   Other Standing Exercises DLS on reg. BOSU x4 min; 4 way Pink XTS walking x6 reps each direction;  DLS airex stance with reachouts and D2 x25 reps each             PT Short Term Goals - 10/21/14 1359    PT SHORT TERM GOAL #1   Title Ind with initial HEP.   Time 2   Period Weeks   Status On-going           PT Long Term Goals - 10/21/14 1359    PT LONG TERM GOAL #1   Title Ind with advanced HEP.   Time 6   Period Weeks   Status On-going   PT LONG TERM GOAL #2   Title Patient walk 30 minutes with pain not > 4/10.   Time 6   Period Weeks   Status On-going   PT LONG TERM GOAL #3   Title Improve Berg score to 48/56.   Time 6   Period Weeks   Status On-going  41/56               Plan - 11/18/14 1507    Clinical Impression Statement Patient continues to show improvement in activity toleratance as well as with balance exercises. Continues to require supervision or CGA for all balance exercises. Intermittant UE  support is required for all balance exercises conducted during treatment. Goals set at evaluation remain on-going secondary to pain and balance deficits.    Pt will benefit from skilled therapeutic intervention in order to improve on the following deficits Pain;Decreased range of motion   Rehab Potential Good   PT Frequency 3x / week   PT Duration 6 weeks   PT Treatment/Interventions ADLs/Self Care Home Management;Neuromuscular re-education;Therapeutic exercise;Gait training   PT Next Visit Plan Lumbar postural exercises and core exercies.  Parallel bar exercises such has rockerboard and stability training.  Resisted XTS    Consulted and Agree with Plan of Care Patient        Problem List Patient Active Problem List   Diagnosis Date Noted  . Bradycardia 07/01/2014  . Thrombocytopenia 06/30/2014  . CAP (community acquired pneumonia) 06/29/2014  . Hypoxia 06/29/2014  . HTN (hypertension) 06/29/2014  . Prostate cancer 06/29/2014  . Hyponatremia 06/29/2014    Ahmed Prima, PTA 11/18/2014 3:10 PM  [facility] [address] [place], [place], [address] Phone: [phone]   Fax:  [phone]

## 2014-11-24 ENCOUNTER — Ambulatory Visit: Payer: Medicare Other | Admitting: Physical Therapy

## 2014-11-24 DIAGNOSIS — M545 Low back pain, unspecified: Secondary | ICD-10-CM

## 2014-11-24 DIAGNOSIS — R2681 Unsteadiness on feet: Secondary | ICD-10-CM

## 2014-11-24 NOTE — Therapy (Signed)
[facility] [place], [place], [address] Phone: [phone]   Fax:  [phone]  Physical Therapy Treatment  Patient Details  Name: Thomas Vega MRN: [id number] Date of Birth: 1926/06/20 Referring Provider:  Jani Gravel, MD  Encounter Date: 11/24/2014      PT End of Session - 11/24/14 1404    Visit Number 11   Number of Visits 18   Date for PT Re-Evaluation 12/04/14   PT Start Time 0152   PT Stop Time 0239   PT Time Calculation (min) 47 min   Activity Tolerance Patient tolerated treatment well   Behavior During Therapy [facility] for tasks assessed/performed      Past Medical History  Diagnosis Date  . Irregular heart rate   . Melanoma   . Prostate cancer   . Bradycardia 07/01/2014  . CAP (community acquired pneumonia) 06/29/2014  . Thrombocytopenia 06/30/2014    Past Surgical History  Procedure Laterality Date  . Back surgery    . Knee surgery      There were no vitals filed for this visit.  Visit Diagnosis:  Bilateral low back pain without sciatica  Unsteadiness                       [facility] Adult PT Treatment/Exercise - 11/24/14 0001    Exercises   Exercises Knee/Hip   Lumbar Exercises: Aerobic   Stationary Bike Nustep @ level 5 x 22 minutes                  PT Short Term Goals - 10/21/14 1359    PT SHORT TERM GOAL #1   Title Ind with initial HEP.   Time 2   Period Weeks   Status On-going           PT Long Term Goals - 10/21/14 1359    PT LONG TERM GOAL #1   Title Ind with advanced HEP.   Time 6   Period Weeks   Status On-going   PT LONG TERM GOAL #2   Title Patient walk 30 minutes with pain not > 4/10.   Time 6   Period Weeks   Status On-going   PT LONG TERM GOAL #3   Title Improve Berg score to 48/56.   Time 6   Period Weeks   Status On-going  41/56               Problem List Patient Active Problem List   Diagnosis Date Noted  . Bradycardia  07/01/2014  . Thrombocytopenia 06/30/2014  . CAP (community acquired pneumonia) 06/29/2014  . Hypoxia 06/29/2014  . HTN (hypertension) 06/29/2014  . Prostate cancer 06/29/2014  . Hyponatremia 06/29/2014   Treatment: Rockerboard x 5 minutes; BOSU inverted x 5 minutes and flat x 5 minutes.  Airex balance beam straight line walking and side stepping x 2 minutes.  Red XTS forward resisted walking x 2 minutes.  Daris Aristizabal, [place] MPT 11/24/2014, 2:39 PM  [facility] [address] [place], [place], [address] Phone: [phone]   Fax:  [phone]

## 2014-12-02 ENCOUNTER — Ambulatory Visit: Payer: Medicare Other | Attending: Specialist | Admitting: Physical Therapy

## 2014-12-02 DIAGNOSIS — R2681 Unsteadiness on feet: Secondary | ICD-10-CM | POA: Insufficient documentation

## 2014-12-02 DIAGNOSIS — M545 Low back pain, unspecified: Secondary | ICD-10-CM

## 2014-12-02 NOTE — Therapy (Signed)
[facility] [place], [place], [address] Phone: [phone]   Fax:  [phone]  Physical Therapy Treatment  Patient Details  Name: Thomas Vega MRN: [id number] Date of Birth: May 31, 1926 Referring Provider:  Jani Gravel, MD  Encounter Date: 12/02/2014      PT End of Session - 12/02/14 1406    Visit Number 12   Number of Visits 18   Date for PT Re-Evaluation 12/04/14   PT Start Time 0147   PT Stop Time 0237   PT Time Calculation (min) 50 min      Past Medical History  Diagnosis Date  . Irregular heart rate   . Melanoma   . Prostate cancer   . Bradycardia 07/01/2014  . CAP (community acquired pneumonia) 06/29/2014  . Thrombocytopenia 06/30/2014    Past Surgical History  Procedure Laterality Date  . Back surgery    . Knee surgery      There were no vitals filed for this visit.  Visit Diagnosis:  Bilateral low back pain without sciatica  Unsteadiness                                 PT Short Term Goals - 10/21/14 1359    PT SHORT TERM GOAL #1   Title Ind with initial HEP.   Time 2   Period Weeks   Status On-going           PT Long Term Goals - 10/21/14 1359    PT LONG TERM GOAL #1   Title Ind with advanced HEP.   Time 6   Period Weeks   Status On-going   PT LONG TERM GOAL #2   Title Patient walk 30 minutes with pain not > 4/10.   Time 6   Period Weeks   Status On-going   PT LONG TERM GOAL #3   Title Improve Berg score to 48/56.   Time 6   Period Weeks   Status On-going  41/56     Treatment:  Nustep Level 5 x 15 minutes f/b rockerboard x 10 minutes; Airex balance beam with parallel bar support x 6 minutes; Orange XTS x 7 minutes.          Problem List Patient Active Problem List   Diagnosis Date Noted  . Bradycardia 07/01/2014  . Thrombocytopenia 06/30/2014  . CAP (community acquired pneumonia) 06/29/2014  . Hypoxia 06/29/2014  . HTN (hypertension)  06/29/2014  . Prostate cancer 06/29/2014  . Hyponatremia 06/29/2014    APPLEGATE, [place] MPT 12/02/2014, 2:42 PM  [facility] [address] [place], [place], [address] Phone: [phone]   Fax:  [phone]

## 2014-12-04 DIAGNOSIS — C61 Malignant neoplasm of prostate: Secondary | ICD-10-CM | POA: Diagnosis not present

## 2014-12-08 DIAGNOSIS — C61 Malignant neoplasm of prostate: Secondary | ICD-10-CM | POA: Diagnosis not present

## 2014-12-08 DIAGNOSIS — R739 Hyperglycemia, unspecified: Secondary | ICD-10-CM | POA: Diagnosis not present

## 2014-12-08 DIAGNOSIS — I10 Essential (primary) hypertension: Secondary | ICD-10-CM | POA: Diagnosis not present

## 2014-12-09 ENCOUNTER — Ambulatory Visit: Payer: Medicare Other | Admitting: Physical Therapy

## 2014-12-09 ENCOUNTER — Encounter: Payer: Self-pay | Admitting: Physical Therapy

## 2014-12-09 DIAGNOSIS — M545 Low back pain, unspecified: Secondary | ICD-10-CM

## 2014-12-09 DIAGNOSIS — R2681 Unsteadiness on feet: Secondary | ICD-10-CM | POA: Diagnosis not present

## 2014-12-09 NOTE — Therapy (Signed)
[facility] [place], [place], [address] Phone: [phone]   Fax:  [phone]  Physical Therapy Treatment  Patient Details  Name: Thomas Vega MRN: [id number] Date of Birth: 11-11-1926 Referring Provider:  Jani Gravel, MD  Encounter Date: 12/09/2014      PT End of Session - 12/09/14 1358    Visit Number 13   Number of Visits 18   Date for PT Re-Evaluation 12/04/14   PT Start Time 1350   PT Stop Time 1436   PT Time Calculation (min) 46 min      Past Medical History  Diagnosis Date  . Irregular heart rate   . Melanoma   . Prostate cancer   . Bradycardia 07/01/2014  . CAP (community acquired pneumonia) 06/29/2014  . Thrombocytopenia 06/30/2014    Past Surgical History  Procedure Laterality Date  . Back surgery    . Knee surgery      There were no vitals filed for this visit.  Visit Diagnosis:  Bilateral low back pain without sciatica  Unsteadiness      Subjective Assessment - 12/09/14 1357    Subjective Reports some back pain from working 3 hours today but stated it was manageable and he could do what he wanted.   Patient Stated Goals Decrease pain and improve balance.   Currently in Pain? Yes  Stated it was manageable and could do whatever he wanted per patient report            [facility] PT Assessment - 12/09/14 0001    Assessment   Medical Diagnosis Severe lumbar spinal stenosis.                     [facility] Adult PT Treatment/Exercise - 12/09/14 0001    Lumbar Exercises: Aerobic   Stationary Bike NuStep L5 x20 min             Balance Exercises - 12/09/14 1706    Balance Exercises: Standing   Rockerboard Lateral;Other time (comment);Intermittent UE support  x2 min   Balance Beam Forward x6 reps, sidestep x6 reps  intermittant UE support   Other Standing Exercises DLS on airex with D2, reachouts x20 reps             PT Short Term Goals - 10/21/14 1359    PT SHORT TERM  GOAL #1   Title Ind with initial HEP.   Time 2   Period Weeks   Status On-going           PT Long Term Goals - 12/09/14 1419    PT LONG TERM GOAL #1   Title Ind with advanced HEP.   Time 6   Period Weeks   Status On-going   PT LONG TERM GOAL #2   Title Patient walk 30 minutes with pain not > 4/10.   Time 6   Period Weeks   Status Partially Met   PT LONG TERM GOAL #3   Title Improve Berg score to 48/56.   Time 6   Period Weeks   Status On-going  41/56               Plan - 12/09/14 1416    Clinical Impression Statement Patinet tolerated treatment well without complaint of increased pain. Continues to do well with balance exericses with episodes of slight LOB leaning posterior during D2 and reachouts on airex but was able to catch himelf and PTA was there for supervision. Remaining goals set at  evaluation remain on-going secondary to pain with prolonged ambulation and balance deficits. Denied pain following treatment.   Pt will benefit from skilled therapeutic intervention in order to improve on the following deficits Pain;Decreased range of motion   Rehab Potential Good   PT Frequency 3x / week   PT Duration 6 weeks   PT Treatment/Interventions ADLs/Self Care Home Management;Neuromuscular re-education;Therapeutic exercise;Gait training   PT Next Visit Plan Lumbar postural exercises and core exercies.  Parallel bar exercises such has rockerboard and stability training.  Resisted XTS    PT Home Exercise Plan issue HEP   Consulted and Agree with Plan of Care Patient        Problem List Patient Active Problem List   Diagnosis Date Noted  . Bradycardia 07/01/2014  . Thrombocytopenia 06/30/2014  . CAP (community acquired pneumonia) 06/29/2014  . Hypoxia 06/29/2014  . HTN (hypertension) 06/29/2014  . Prostate cancer 06/29/2014  . Hyponatremia 06/29/2014    Wynelle Fanny, PTA 12/09/2014, 5:07 PM  [facility] [address] [place], [place], [address] Phone: [phone]   Fax:  [phone]

## 2014-12-10 DIAGNOSIS — N138 Other obstructive and reflux uropathy: Secondary | ICD-10-CM | POA: Diagnosis not present

## 2014-12-10 DIAGNOSIS — N401 Enlarged prostate with lower urinary tract symptoms: Secondary | ICD-10-CM | POA: Diagnosis not present

## 2014-12-10 DIAGNOSIS — C61 Malignant neoplasm of prostate: Secondary | ICD-10-CM | POA: Diagnosis not present

## 2014-12-10 DIAGNOSIS — R972 Elevated prostate specific antigen [PSA]: Secondary | ICD-10-CM | POA: Diagnosis not present

## 2014-12-10 DIAGNOSIS — R3913 Splitting of urinary stream: Secondary | ICD-10-CM | POA: Diagnosis not present

## 2014-12-16 ENCOUNTER — Encounter: Payer: Self-pay | Admitting: *Deleted

## 2014-12-16 ENCOUNTER — Ambulatory Visit: Payer: Medicare Other | Admitting: *Deleted

## 2014-12-16 DIAGNOSIS — R2681 Unsteadiness on feet: Secondary | ICD-10-CM

## 2014-12-16 DIAGNOSIS — M545 Low back pain, unspecified: Secondary | ICD-10-CM

## 2014-12-16 NOTE — Therapy (Signed)
[facility] [place], [place], [address] Phone: [phone]   Fax:  [phone]  Physical Therapy Treatment  Patient Details  Name: Thomas Vega MRN: [id number] Date of Birth: Oct 29, 1926 Referring Provider:  Jani Gravel, MD  Encounter Date: 12/16/2014      PT End of Session - 12/16/14 1429    Visit Number 14   Number of Visits 18   Date for PT Re-Evaluation 12/04/14   PT Start Time 1007   PT Stop Time 1435   PT Time Calculation (min) 50 min      Past Medical History  Diagnosis Date  . Irregular heart rate   . Melanoma   . Prostate cancer   . Bradycardia 07/01/2014  . CAP (community acquired pneumonia) 06/29/2014  . Thrombocytopenia 06/30/2014    Past Surgical History  Procedure Laterality Date  . Back surgery    . Knee surgery      There were no vitals filed for this visit.  Visit Diagnosis:  Bilateral low back pain without sciatica  Unsteadiness      Subjective Assessment - 12/16/14 1414    Subjective Doing better today. No LBP   Patient Stated Goals Decrease pain and improve balance.   Currently in Pain? No/denies   Pain Location Back   Pain Orientation Lower   Pain Onset More than a month ago   Pain Frequency Intermittent   Aggravating Factors  prolonged standing                         [facility] Adult PT Treatment/Exercise - 12/16/14 0001    Exercises   Exercises Knee/Hip   Lumbar Exercises: Aerobic   Stationary Bike NuStep L5 x20 min, 1600 steps   Knee/Hip Exercises: Standing   Other Standing Knee Exercises XTS, pink band,,resisted walking backwards, LT side stepping and RTY side stepping all x 10 each way  with SBA/CGA             Balance Exercises - 12/16/14 1422    Balance Exercises: Standing   Rockerboard Lateral;Other time (comment);Intermittent UE support;Anterior/posterior  x2 min   Balance Beam Forward x6 reps, sidestep x6 reps  intermittant UE support   Other  Standing Exercises DLS on airex with D2, reachouts x20 reps             PT Short Term Goals - 12/16/14 1712    PT SHORT TERM GOAL #1   Title Ind with initial HEP.   Period Weeks   Status Achieved           PT Long Term Goals - 12/09/14 1419    PT LONG TERM GOAL #1   Title Ind with advanced HEP.   Time 6   Period Weeks   Status On-going   PT LONG TERM GOAL #2   Title Patient walk 30 minutes with pain not > 4/10.   Time 6   Period Weeks   Status Partially Met   PT LONG TERM GOAL #3   Title Improve Berg score to 48/56.   Time 6   Period Weeks   Status On-going  41/56               Plan - 12/16/14 1556    Clinical Impression Statement No LBP today during RX and pt was able to perform better at balance exs and Act.'s Goals are close to being Met   Pt will benefit from skilled therapeutic intervention in  order to improve on the following deficits Pain;Decreased range of motion   Rehab Potential Good   PT Frequency 3x / week   PT Duration 6 weeks   PT Treatment/Interventions ADLs/Self Care Home Management;Neuromuscular re-education;Therapeutic exercise;Gait training   PT Next Visit Plan Lumbar postural exercises and core exercies.  Parallel bar exercises such has rockerboard and stability training.  Resisted XTS    PT Home Exercise Plan issue HEP   Consulted and Agree with Plan of Care Patient        Problem List Patient Active Problem List   Diagnosis Date Noted  . Bradycardia 07/01/2014  . Thrombocytopenia 06/30/2014  . CAP (community acquired pneumonia) 06/29/2014  . Hypoxia 06/29/2014  . HTN (hypertension) 06/29/2014  . Prostate cancer 06/29/2014  . Hyponatremia 06/29/2014    Fumio Vandam,CHRIS, PTA 12/16/2014, 5:29 PM  [facility] [address] [place], [place], [address] Phone: [phone]   Fax:  [phone]

## 2014-12-18 ENCOUNTER — Ambulatory Visit: Payer: Medicare Other | Admitting: *Deleted

## 2014-12-18 ENCOUNTER — Encounter: Payer: Self-pay | Admitting: *Deleted

## 2014-12-18 DIAGNOSIS — M545 Low back pain, unspecified: Secondary | ICD-10-CM

## 2014-12-18 DIAGNOSIS — R2681 Unsteadiness on feet: Secondary | ICD-10-CM

## 2014-12-18 NOTE — Therapy (Signed)
[facility] [place], [place], [address] Phone: [phone]   Fax:  [phone]  Physical Therapy Treatment  Patient Details  Name: Thomas Vega MRN: [id number] Date of Birth: 28-Nov-1926 Referring Provider:  Jani Gravel, MD  Encounter Date: 12/18/2014      PT End of Session - 12/18/14 1426    Visit Number 15   Number of Visits 18   Date for PT Re-Evaluation 12/04/14   PT Start Time 1829   PT Stop Time 1437   PT Time Calculation (min) 50 min      Past Medical History  Diagnosis Date  . Irregular heart rate   . Melanoma   . Prostate cancer   . Bradycardia 07/01/2014  . CAP (community acquired pneumonia) 06/29/2014  . Thrombocytopenia 06/30/2014    Past Surgical History  Procedure Laterality Date  . Back surgery    . Knee surgery      There were no vitals filed for this visit.  Visit Diagnosis:  Bilateral low back pain without sciatica  Unsteadiness      Subjective Assessment - 12/18/14 1401    Subjective Doing better today.LBP from ADLs   Patient Stated Goals Decrease pain and improve balance.   Currently in Pain? Yes   Pain Score 3    Pain Location Back   Pain Onset More than a month ago   Pain Frequency Intermittent   Aggravating Factors  prolonged standing   Pain Relieving Factors rest            [facility] PT Assessment - 12/18/14 0001    Berg Balance Test   Sit to Stand Able to stand without using hands and stabilize independently   Standing Unsupported Able to stand safely 2 minutes   Sitting with Back Unsupported but Feet Supported on Floor or Stool Able to sit safely and securely 2 minutes   Stand to Sit Sits safely with minimal use of hands   Transfers Able to transfer safely, minor use of hands   Standing Unsupported with Eyes Closed Able to stand 10 seconds with supervision   Standing Ubsupported with Feet Together Able to place feet together independently and stand 1 minute safely   From  Standing, Reach Forward with Outstretched Arm Can reach confidently >25 cm (10")   From Standing Position, Pick up Object from Floor Able to pick up shoe safely and easily   From Standing Position, Turn to Look Behind Over each Shoulder Turn sideways only but maintains balance   Turn 360 Degrees Able to turn 360 degrees safely but slowly   Standing Unsupported, Alternately Place Feet on Step/Stool Able to stand independently and safely and complete 8 steps in 20 seconds   Standing Unsupported, One Foot in Front Able to plae foot ahead of the other independently and hold 30 seconds   Standing on One Leg Tries to lift leg/unable to hold 3 seconds but remains standing independently   Total Score 47                     [facility] Adult PT Treatment/Exercise - 12/18/14 0001    Exercises   Exercises Knee/Hip   Lumbar Exercises: Aerobic   Stationary Bike NuStep L5 x20 min, 1600 steps             Balance Exercises - 12/18/14 1425    Balance Exercises: Standing   Rockerboard Lateral;Other time (comment);Intermittent UE support;Anterior/posterior  x3 min each  PT Short Term Goals - 12/16/14 1712    PT SHORT TERM GOAL #1   Title Ind with initial HEP.   Period Weeks   Status Achieved           PT Long Term Goals - 12/18/14 1420    PT LONG TERM GOAL #1   Title Ind with advanced HEP.   Time 6   Period Weeks   Status On-going   PT LONG TERM GOAL #2   Title Patient walk 30 minutes with pain not > 4/10.   Time 6   Period Weeks   Status Partially Met   PT LONG TERM GOAL #3   Title Improve Berg score to 48/56.   Time 6   Period Weeks   Status On-going               Plan - 12/18/14 1427    Clinical Impression Statement Pt did well with Exs and Rx today and was very close to meeting LTG for Berg test. Goal of 48/56 and pt had 48/56. He is also close to meeting LTG for LBP after walking for 30 mins. These goalds are ongoing and should be met soon    Pt will benefit from skilled therapeutic intervention in order to improve on the following deficits Pain;Decreased range of motion   Rehab Potential Good   PT Frequency 3x / week   PT Duration 6 weeks   PT Treatment/Interventions ADLs/Self Care Home Management;Neuromuscular re-education;Therapeutic exercise;Gait training   PT Next Visit Plan Lumbar postural exercises and core exercies.  Parallel bar exercises such has rockerboard and stability training.  Resisted XTS    Consulted and Agree with Plan of Care Patient        Problem List Patient Active Problem List   Diagnosis Date Noted  . Bradycardia 07/01/2014  . Thrombocytopenia 06/30/2014  . CAP (community acquired pneumonia) 06/29/2014  . Hypoxia 06/29/2014  . HTN (hypertension) 06/29/2014  . Prostate cancer 06/29/2014  . Hyponatremia 06/29/2014    RAMSEUR,CHRIS, PTA 12/18/2014, 2:31 PM  [facility] [address] [place], [place], [address] Phone: [phone]   Fax:  [phone]

## 2014-12-22 ENCOUNTER — Ambulatory Visit: Payer: Medicare Other | Admitting: Physical Therapy

## 2014-12-25 ENCOUNTER — Ambulatory Visit: Payer: Medicare Other | Admitting: Physical Therapy

## 2014-12-25 ENCOUNTER — Encounter: Payer: Self-pay | Admitting: Physical Therapy

## 2014-12-25 DIAGNOSIS — M545 Low back pain, unspecified: Secondary | ICD-10-CM

## 2014-12-25 DIAGNOSIS — R2681 Unsteadiness on feet: Secondary | ICD-10-CM | POA: Diagnosis not present

## 2014-12-25 NOTE — Therapy (Signed)
[facility] [place], [place], [address] Phone: [phone]   Fax:  [phone]  Physical Therapy Treatment  Patient Details  Name: Thomas Vega MRN: [id number] Date of Birth: [birth year] Referring Provider:  Jani Gravel, MD  Encounter Date: 12/25/2014      PT End of Session - 12/25/14 1321    Visit Number 16   Number of Visits 18   Date for PT Re-Evaluation 01/07/15   PT Start Time 7616   PT Stop Time 1356   PT Time Calculation (min) 43 min   Activity Tolerance Patient tolerated treatment well   Behavior During Therapy [facility] for tasks assessed/performed      Past Medical History  Diagnosis Date  . Irregular heart rate   . Melanoma   . Prostate cancer   . Bradycardia 07/01/2014  . CAP (community acquired pneumonia) 06/29/2014  . Thrombocytopenia 06/30/2014    Past Surgical History  Procedure Laterality Date  . Back surgery    . Knee surgery      There were no vitals filed for this visit.  Visit Diagnosis:  Bilateral low back pain without sciatica  Unsteadiness      Subjective Assessment - 12/25/14 1316    Subjective Patient has reported no falls or LOB and feels like therapy is helping   Patient Stated Goals Decrease pain and improve balance.   Currently in Pain? Yes   Pain Score 3    Pain Location Back   Pain Orientation Lower   Pain Descriptors / Indicators Sore   Pain Onset More than a month ago   Pain Frequency Intermittent   Aggravating Factors  prolong standing or walking   Pain Relieving Factors rest or sitting                         [facility] Adult PT Treatment/Exercise - 12/25/14 0001    Lumbar Exercises: Aerobic   Stationary Bike NuStep L5 x20 min, 1700 steps, monitored for progression   Lumbar Exercises: Supine   Ab Set 20 reps;5 seconds   Bent Knee Raise 20 reps;3 seconds   Bridge 20 reps;3 seconds   Straight Leg Raise 20 reps;3 seconds   Knee/Hip Exercises: Standing   Other Standing Knee Exercises Red t-band for scap retraction w core activation 2x10             Balance Exercises - 12/25/14 1343    Balance Exercises: Standing   Standing Eyes Opened Narrow base of support (BOS);Wide (BOA);Foam/compliant surface  16min   Rockerboard Anterior/posterior  51min   Marching Limitations standing marching 3x10 with no UE support           PT Education - 12/25/14 1320    Education provided Yes   Education Details HEP   Person(s) Educated Patient   Methods Explanation;Demonstration;Handout   Comprehension Verbalized understanding;Returned demonstration          PT Short Term Goals - 12/16/14 1712    PT SHORT TERM GOAL #1   Title Ind with initial HEP.   Period Weeks   Status Achieved           PT Long Term Goals - 12/25/14 1321    PT LONG TERM GOAL #1   Title Ind with advanced HEP.   Time 6   Period Weeks   Status Achieved   PT LONG TERM GOAL #2   Title Patient walk 30 minutes with pain not > 4/10.  Time 6   Period Weeks   Status On-going  7/10   PT LONG TERM GOAL #3   Title Improve Berg score to 48/56.   Time 6   Period Weeks   Status On-going               Plan - 12/25/14 1324    Clinical Impression Statement Patient continues to progress and has reported no falls or LOB and feels less pain in low back overall. Patient has low pain at rest yet prolong walking or standing increases pain to 7/10. Patient was given HEP for lumbar stabilization exercises. Patient met LTG #1 today yet others ongoing due to continued pain with walking and slight balance deficit. No antalgic gait today.   Pt will benefit from skilled therapeutic intervention in order to improve on the following deficits Pain;Decreased range of motion   Rehab Potential Good   PT Frequency 3x / week   PT Duration 6 weeks   PT Treatment/Interventions ADLs/Self Care Home Management;Neuromuscular re-education;Therapeutic exercise;Gait training   PT Next Visit  Plan Lumbar postural exercises and core exercies.  Parallel bar exercises such has rockerboard and stability training.  Resisted XTS    Consulted and Agree with Plan of Care Patient        Problem List Patient Active Problem List   Diagnosis Date Noted  . Bradycardia 07/01/2014  . Thrombocytopenia 06/30/2014  . CAP (community acquired pneumonia) 06/29/2014  . Hypoxia 06/29/2014  . HTN (hypertension) 06/29/2014  . Prostate cancer 06/29/2014  . Hyponatremia 06/29/2014    DUNFORD, CHRISTINA P, PTA 12/25/2014, 2:00 PM  [facility] [place], [place], [address] Phone: [phone]   Fax:  [phone]

## 2014-12-25 NOTE — Patient Instructions (Signed)
Pelvic Tilt: Posterior - Legs Bent (Supine)   Tighten stomach and flatten back by rolling pelvis down. Hold _10___ seconds. Relax. Repeat _10-30___ times per set. Do __2__ sets per session. Do _2___ sessions per day.  http://orth.exer.us/202   Copyright  VHI. All rights reserved.  Bridging   Slowly raise buttocks from floor, keeping stomach tight. Repeat _10___ times per set. Do __2__ sets per session. Do __2__ sessions per day.  http://orth.exer.us/1096   Copyright  VHI. All rights reserved.  Straight Leg Raise   Tighten stomach and slowly raise locked right leg __4__ inches from floor. Repeat __10-30__ times per set. Do __2__ sets per session. Do __2__ sessions per day.  http://orth.exer.us/1102   Copyright  VHI. All rights reserved.  Scapular Retraction: Bilateral   Facing anchor, pull arms back, bringing shoulder blades together. Repeat _30___ times per set. Do __2-3__ sets per session. Do _2___ sessions per day.  http://orth.exer.us/176   Copyright  VHI. All rights reserved.   

## 2014-12-30 ENCOUNTER — Ambulatory Visit: Payer: Medicare Other | Attending: Specialist | Admitting: *Deleted

## 2014-12-30 ENCOUNTER — Encounter: Payer: Self-pay | Admitting: *Deleted

## 2014-12-30 DIAGNOSIS — R2681 Unsteadiness on feet: Secondary | ICD-10-CM | POA: Diagnosis not present

## 2014-12-30 DIAGNOSIS — M545 Low back pain, unspecified: Secondary | ICD-10-CM

## 2014-12-30 NOTE — Therapy (Signed)
[facility] [place], [place], [address] Phone: [phone]   Fax:  [phone]  Physical Therapy Treatment  Patient Details  Name: Thomas Vega MRN: [id number] Date of Birth: 1926-07-29 Referring Provider:  Jani Gravel, MD  Encounter Date: 12/30/2014      PT End of Session - 12/30/14 1452    Visit Number 17   Number of Visits 18   Date for PT Re-Evaluation 01/07/15   PT Start Time 1430   PT Stop Time 9323   PT Time Calculation (min) 49 min      Past Medical History  Diagnosis Date  . Irregular heart rate   . Melanoma ([place])   . Prostate cancer ([place])   . Bradycardia 07/01/2014  . CAP (community acquired pneumonia) 06/29/2014  . Thrombocytopenia ([place]) 06/30/2014    Past Surgical History  Procedure Laterality Date  . Back surgery    . Knee surgery      There were no vitals filed for this visit.  Visit Diagnosis:  Bilateral low back pain without sciatica  Unsteadiness      Subjective Assessment - 12/30/14 1450    Subjective Patient has reported no falls or LOB and feels like therapy is helping. LBP 2-3/10   Patient Stated Goals Decrease pain and improve balance.   Currently in Pain? Yes   Pain Score 3    Pain Location Back   Pain Orientation Lower   Pain Descriptors / Indicators Sore   Pain Onset More than a month ago   Pain Frequency Intermittent   Aggravating Factors  prolonged standing and walking   Pain Relieving Factors rest or sitting                         [facility] Adult PT Treatment/Exercise - 12/30/14 0001    Exercises   Exercises Knee/Hip   Lumbar Exercises: Aerobic   Stationary Bike NuStep L5 x20 min, 1700 steps, monitored for progression             Balance Exercises - 12/30/14 1512    Balance Exercises: Standing   Rockerboard Lateral;Other time (comment);Intermittent UE support;Anterior/posterior  x3 min each   Balance Beam Forward x6 reps, sidestep x6 reps   intermittant UE support   OTAGO PROGRAM   Backwards Walking --  XTS pink, 4 way resisted walking x 10     standing on airex pad D1 and D2 diagonal reaches, and reach outs all 3x10 Airex beam  Side to side and forward walking       PT Short Term Goals - 12/16/14 1712    PT SHORT TERM GOAL #1   Title Ind with initial HEP.   Period Weeks   Status Achieved           PT Long Term Goals - 12/25/14 1321    PT LONG TERM GOAL #1   Title Ind with advanced HEP.   Time 6   Period Weeks   Status Achieved   PT LONG TERM GOAL #2   Title Patient walk 30 minutes with pain not > 4/10.   Time 6   Period Weeks   Status On-going  7/10   PT LONG TERM GOAL #3   Title Improve Berg score to 48/56.   Time 6   Period Weeks   Status On-going               Plan - 12/30/14 1534    Clinical Impression  Statement Pt did well with all exs and has progressed well toward goals and is close to meeting LTGs. Check Berg again next week. Goals Ongoing    Pt will benefit from skilled therapeutic intervention in order to improve on the following deficits Pain;Decreased range of motion   Rehab Potential Good   PT Duration 6 weeks   PT Treatment/Interventions ADLs/Self Care Home Management;Neuromuscular re-education;Therapeutic exercise;Gait training   PT Next Visit Plan DC after next visit. DC Gcode DC to [employer] and Agree with Plan of Care Patient        Problem List Patient Active Problem List   Diagnosis Date Noted  . Bradycardia 07/01/2014  . Thrombocytopenia ([place]) 06/30/2014  . CAP (community acquired pneumonia) 06/29/2014  . Hypoxia 06/29/2014  . HTN (hypertension) 06/29/2014  . Prostate cancer ([place]) 06/29/2014  . Hyponatremia 06/29/2014    RAMSEUR,CHRIS, PTA 12/30/2014, 3:40 PM  [facility] [address] [place], [place], [address] Phone: [phone]   Fax:  [phone]

## 2015-01-01 ENCOUNTER — Ambulatory Visit: Payer: Medicare Other | Admitting: *Deleted

## 2015-01-01 ENCOUNTER — Encounter: Payer: Self-pay | Admitting: *Deleted

## 2015-01-01 DIAGNOSIS — R2681 Unsteadiness on feet: Secondary | ICD-10-CM

## 2015-01-01 DIAGNOSIS — M545 Low back pain, unspecified: Secondary | ICD-10-CM

## 2015-01-01 NOTE — Therapy (Signed)
[facility] [place], [place], [address] Phone: [phone]   Fax:  [phone]  Physical Therapy Treatment  Patient Details  Name: Thomas Vega MRN: [id number] Date of Birth: 04-Feb-1927 Referring Provider:  Jani Gravel, MD  Encounter Date: 01/01/2015      PT End of Session - 01/01/15 1444    Visit Number 18   Number of Visits 18   Date for PT Re-Evaluation 01/07/15   PT Start Time 27      Past Medical History  Diagnosis Date  . Irregular heart rate   . Melanoma ([place])   . Prostate cancer ([place])   . Bradycardia 07/01/2014  . CAP (community acquired pneumonia) 06/29/2014  . Thrombocytopenia ([place]) 06/30/2014    Past Surgical History  Procedure Laterality Date  . Back surgery    . Knee surgery      There were no vitals filed for this visit.  Visit Diagnosis:  Bilateral low back pain without sciatica  Unsteadiness      Subjective Assessment - 01/01/15 1459    Subjective Patient has reported no falls or LOB and feels like therapy is helping. LBP 2-3/10   Patient Stated Goals Decrease pain and improve balance.   Currently in Pain? Yes   Pain Score 3    Pain Location Back   Pain Orientation Lower   Pain Descriptors / Indicators Sore   Pain Onset More than a month ago   Pain Frequency Intermittent   Aggravating Factors  prolonged standing   Pain Relieving Factors rest or sitting            [facility] PT Assessment - 01/01/15 0001    Berg Balance Test   Sit to Stand Able to stand without using hands and stabilize independently   Standing Unsupported Able to stand safely 2 minutes   Sitting with Back Unsupported but Feet Supported on Floor or Stool Able to sit safely and securely 2 minutes   Stand to Sit Sits safely with minimal use of hands   Transfers Able to transfer safely, minor use of hands   Standing Unsupported with Eyes Closed Able to stand 10 seconds with supervision   Standing Ubsupported with  Feet Together Able to place feet together independently and stand 1 minute safely   From Standing, Reach Forward with Outstretched Arm Can reach confidently >25 cm (10")   From Standing Position, Pick up Object from Floor Able to pick up shoe safely and easily   From Standing Position, Turn to Look Behind Over each Shoulder Looks behind one side only/other side shows less weight shift   Turn 360 Degrees Able to turn 360 degrees safely but slowly   Standing Unsupported, Alternately Place Feet on Step/Stool Able to stand independently and safely and complete 8 steps in 20 seconds   Standing Unsupported, One Foot in Front Able to plae foot ahead of the other independently and hold 30 seconds   Standing on One Leg Tries to lift leg/unable to hold 3 seconds but remains standing independently   Total Score 48                     [facility] Adult PT Treatment/Exercise - 01/01/15 0001    Exercises   Exercises Knee/Hip   Lumbar Exercises: Aerobic   Stationary Bike NuStep L5 x20 min, 1700 steps, monitored for progression             Balance Exercises - 01/01/15 1503  Balance Exercises: Standing   Standing Eyes Opened Narrow base of support (BOS);Wide (BOA);Foam/compliant surface   Standing Eyes Closed Narrow base of support (BOS);Foam/compliant surface;Solid surface   Rockerboard Anterior/posterior  43mn   Balance Beam Forward x6 reps, sidestep x6 reps  intermittant UE support   Marching Limitations standing marching 3x10 with no UE support   Other Standing Exercises DLS on airex with D2, reachouts x20 reps             PT Short Term Goals - 12016/10/161504    PT SHORT TERM GOAL #1   Title Ind with initial HEP.   Time 2   Period Weeks   Status Achieved           PT Long Term Goals - 1October 16, 20161504    PT LONG TERM GOAL #1   Title Ind with advanced HEP.   Period Weeks   Status Achieved   PT LONG TERM GOAL #2   Title Patient walk 30 minutes with pain not > 4/10.    Time 6   Period Weeks   Status Not Met   PT LONG TERM GOAL #3   Title Improve Berg score to 48/56.   Time 6   Period Weeks   Status Achieved                 G-Codes - 110/16/161514    Functional Assessment Tool Used FOTO. 18th visit      Problem List Patient Active Problem List   Diagnosis Date Noted  . Bradycardia 07/01/2014  . [place]ocyt[place] 06/30/2014  . CAP (community acquired pneumonia) 06/29/2014  . Hypoxia 06/29/2014  . HTN (hypertension) 06/29/2014  . [place] 06/29/2014  . Hyponatremia 06/29/2014    Iann Rodier,CHRIS, PTA 116-Oct-2016 3:22 PM  [facility] [address]Phone: [phone]  Fax:  [phone]

## 2015-03-17 DIAGNOSIS — B351 Tinea unguium: Secondary | ICD-10-CM | POA: Diagnosis not present

## 2015-03-17 DIAGNOSIS — L814 Other melanin hyperpigmentation: Secondary | ICD-10-CM | POA: Diagnosis not present

## 2015-03-17 DIAGNOSIS — Z85820 Personal history of malignant melanoma of skin: Secondary | ICD-10-CM | POA: Diagnosis not present

## 2015-03-17 DIAGNOSIS — L821 Other seborrheic keratosis: Secondary | ICD-10-CM | POA: Diagnosis not present

## 2015-03-17 DIAGNOSIS — L820 Inflamed seborrheic keratosis: Secondary | ICD-10-CM | POA: Diagnosis not present

## 2015-05-27 DIAGNOSIS — J40 Bronchitis, not specified as acute or chronic: Secondary | ICD-10-CM | POA: Diagnosis not present

## 2015-05-27 DIAGNOSIS — J069 Acute upper respiratory infection, unspecified: Secondary | ICD-10-CM | POA: Diagnosis not present

## 2015-06-03 DIAGNOSIS — I10 Essential (primary) hypertension: Secondary | ICD-10-CM | POA: Diagnosis not present

## 2015-06-03 DIAGNOSIS — J069 Acute upper respiratory infection, unspecified: Secondary | ICD-10-CM | POA: Diagnosis not present

## 2015-06-03 DIAGNOSIS — C61 Malignant neoplasm of prostate: Secondary | ICD-10-CM | POA: Diagnosis not present

## 2015-06-04 DIAGNOSIS — M545 Low back pain: Secondary | ICD-10-CM | POA: Diagnosis not present

## 2015-06-08 ENCOUNTER — Other Ambulatory Visit: Payer: Self-pay | Admitting: Specialist

## 2015-06-08 DIAGNOSIS — C61 Malignant neoplasm of prostate: Secondary | ICD-10-CM | POA: Diagnosis not present

## 2015-06-08 DIAGNOSIS — I10 Essential (primary) hypertension: Secondary | ICD-10-CM | POA: Diagnosis not present

## 2015-06-08 DIAGNOSIS — M545 Low back pain: Secondary | ICD-10-CM

## 2015-06-18 DIAGNOSIS — Z0000 Encounter for general adult medical examination without abnormal findings: Secondary | ICD-10-CM | POA: Diagnosis not present

## 2015-06-18 DIAGNOSIS — R3913 Splitting of urinary stream: Secondary | ICD-10-CM | POA: Diagnosis not present

## 2015-06-18 DIAGNOSIS — C61 Malignant neoplasm of prostate: Secondary | ICD-10-CM | POA: Diagnosis not present

## 2015-06-18 DIAGNOSIS — R972 Elevated prostate specific antigen [PSA]: Secondary | ICD-10-CM | POA: Diagnosis not present

## 2015-07-02 ENCOUNTER — Ambulatory Visit
Admission: RE | Admit: 2015-07-02 | Discharge: 2015-07-02 | Disposition: A | Discharge status: Home / Self Care | Payer: Medicare Other | Source: Ambulatory Visit | Attending: Specialist | Admitting: Specialist

## 2015-07-02 DIAGNOSIS — M545 Low back pain: Secondary | ICD-10-CM

## 2015-07-02 DIAGNOSIS — M4806 Spinal stenosis, lumbar region: Secondary | ICD-10-CM | POA: Diagnosis not present

## 2015-07-16 DIAGNOSIS — M6281 Muscle weakness (generalized): Secondary | ICD-10-CM | POA: Diagnosis not present

## 2015-07-23 ENCOUNTER — Ambulatory Visit (INDEPENDENT_AMBULATORY_CARE_PROVIDER_SITE_OTHER): Payer: Medicare Other | Admitting: Neurology

## 2015-07-23 ENCOUNTER — Encounter: Payer: Self-pay | Admitting: Neurology

## 2015-07-23 VITALS — BP 162/60 | HR 78 | Resp 18 | Ht 75.0 in | Wt 207.0 lb

## 2015-07-23 DIAGNOSIS — R29818 Other symptoms and signs involving the nervous system: Secondary | ICD-10-CM | POA: Diagnosis not present

## 2015-07-23 DIAGNOSIS — R29898 Other symptoms and signs involving the musculoskeletal system: Secondary | ICD-10-CM

## 2015-07-23 DIAGNOSIS — G609 Hereditary and idiopathic neuropathy, unspecified: Secondary | ICD-10-CM

## 2015-07-23 DIAGNOSIS — M4806 Spinal stenosis, lumbar region: Secondary | ICD-10-CM | POA: Diagnosis not present

## 2015-07-23 DIAGNOSIS — M48061 Spinal stenosis, lumbar region without neurogenic claudication: Secondary | ICD-10-CM

## 2015-07-23 DIAGNOSIS — R799 Abnormal finding of blood chemistry, unspecified: Secondary | ICD-10-CM | POA: Diagnosis not present

## 2015-07-23 DIAGNOSIS — R2689 Other abnormalities of gait and mobility: Secondary | ICD-10-CM

## 2015-07-23 NOTE — Patient Instructions (Signed)
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    Remember to drink plenty of fluid, eat healthy meals and do not skip any meals. Try to eat protein with a every meal and eat a healthy snack such as fruit or nuts in between meals. Try to keep a regular sleep-wake schedule and try to exercise daily, particularly in the form of walking, 20-30 minutes a day, if you can. Change positions slowly and you should start using a cane, consider a walker.   As far as your medications are concerned, I would like to suggest no new medications. Please increase your water intake and I agree with physical therapy.   As far as diagnostic testing: blood work. We will do an EMG and nerve conduction velocity test, which is an electrical nerve and muscle test, which we will schedule. We will call you with the results.  We may consider a brain MRI.

## 2015-07-23 NOTE — Progress Notes (Signed)
Subjective:    Patient ID: Thomas Vega is a 82 y.o. male.  HPI     Star Age, MD, PhD [facility] [address]O. [place], [place] [address]  Dear Dr. Louanne Skye,   I saw your patient, Thomas Vega, upon your kind request, in my neurologic clinic today for initial consultation of his fine motor difficulties. The patient is accompanied by his fiance  today. 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 Jeffie Pollock and before then by Dr. Terance Hart, since 2010 or 2011), s/p cataract removal b/l in 2015, hypertension, arthritis, status post knee replacement surgery 2004, melanoma, s/p removal from back and and left shoulder, spinal stenosis, pneumonia in April 2016 with hospitalization from 06/28/2014 through 07/01/2014, chronic low back pain, status post epidural steroid injections, status post back surgery, who has had progressive weakness and balance problems and fine motor dyscontrol. Symptoms have been ongoing for at least 3 years. He notices lower body weakness, upper body is fairly good with strength but he is not as good with his fine motor skills in his hands. He feels off-balance. He was previously told that he was prediabetic but never been told he has been diabetic. His cancer of prostate is being followed by Dr. Jeffie Pollock at this time. He has not fallen. He feels like he could fall.   He has a known history of multilevel lumbar spinal stenosis and neurogenic claudication, has undergone multiple epidural steroid injections with temporary relief, has had multiple MRI lumbar spine, in September 2008, August 2013, and most recently on 07/02/2015 which I reviewed:  IMPRESSION: 1. At L2-3 and L3-4 there are mild broad-based disc bulges with severe bilateral facet arthropathy and severe spinal stenosis. 2. At L4-5 there is a mild broad-based disc bulge  with severe bilateral facet arthropathy and severe spinal stenosis.   He has undergone physical therapy and has been walking with a cane. He has been re-referred to physical therapy. Physical therapy he has gone to [facility]. He believes PT has helped. I reviewed your office note from 06/04/2015. He had blood work with Dr. Jani Gravel, his primary care physician recently on 06/03/2015 which I reviewed. CBC with differential was unremarkable, CMP unremarkable, lipid panel showed a total cholesterol of 154, triglycerides 66, LDL 74, PSA elevated at 19.5. Urinalysis negative. He lives with his fiance. His first wife passed away. He had 5 children with his first wife, one son sadly passed away from melanoma. Another son also has melanoma. He is a nonsmoker. He does not drink alcohol currently and quit in 2014. He drinks about 2 cups of coffee per day, 2 glasses of tea per day, usually herbal tea, and 3 glasses of water at the most per day.   His Past Medical History Is Significant For: Past Medical History  Diagnosis Date  . Irregular heart rate   . Melanoma ([place])   . Prostate cancer ([place])   . Bradycardia 07/01/2014  . CAP (community acquired pneumonia) 06/29/2014  . Thrombocytopenia ([place]) 06/30/2014  . Heart murmur     His Past Surgical History Is Significant For: Past Surgical History  Procedure Laterality Date  . Back surgery    . Knee surgery    . Cataract extraction    . Melanoma excision      His Family History Is Significant For: Family History  Problem Relation Age of Onset  . Heart  Problems Mother   . Stroke Father     His Social History Is Significant For: Social History   Social History  . Marital Status: Significant Other    Spouse Name: N/A  . Number of Children: N/A  . Years of Education: DDS   Occupational History  . Retired     Social History Main Topics  . Smoking status: Never Smoker   . Smokeless tobacco: None  . Alcohol Use: No  . Drug Use: No  . Sexual  Activity: Not Asked   Other Topics Concern  . None   Social History Narrative   Drinks 2 cups of coffee a day     His Allergies Are:  No Known Allergies:   His Current Medications Are:  Outpatient Encounter Prescriptions as of 07/23/2015  Medication Sig  . aspirin EC 81 MG tablet Take 81 mg by mouth daily.  Marland Kitchen guaiFENesin-dextromethorphan (ROBITUSSIN DM) 100-10 MG/5ML syrup Take 5 mLs by mouth every 4 (four) hours as needed for cough. (Patient not taking: Reported on 10/09/2014)  . lisinopril (PRINIVIL,ZESTRIL) 5 MG tablet Take 2.5 mg by mouth daily.  . Multiple Vitamin (MULTIVITAMIN WITH MINERALS) TABS tablet Take 1 tablet by mouth daily.  Marland Kitchen OVER THE COUNTER MEDICATION Take 1 tablet by mouth daily as needed (antihistamine. Allergies.).   Marland Kitchen sodium chloride (OCEAN) 0.65 % nasal spray Place 2 sprays into the nose 2 (two) times daily as needed for congestion.  . [DISCONTINUED] cefUROXime (CEFTIN) 500 MG tablet Take 1 tablet (500 mg total) by mouth 2 (two) times daily with a meal. Antibiotic. Starting tomorrow, take as directed for 5 more days/ (Patient not taking: Reported on 10/09/2014)  . [DISCONTINUED] doxazosin (CARDURA) 2 MG tablet Take 1 mg by mouth daily.   No facility-administered encounter medications on file as of 07/23/2015.  :   Review of Systems:  Out of a complete 14 point review of systems, all are reviewed and negative with the exception of these symptoms as listed below:   Review of Systems  HENT: Positive for tinnitus.   Respiratory: Positive for cough.   Allergic/Immunologic: Positive for environmental allergies.  Neurological: Positive for weakness and numbness.       Patient reports that he is unsteady on his feet and bumps into walls. Patient had a fall about 3 weeks ago. Had recent Lumbar MRI.  Patient states that he was referred by Dr. Louanne Skye who wants to rule out that these problems are not coming from his brain or neck.   Hematological: Bruises/bleeds easily.   Psychiatric/Behavioral:       Decreased energy     Objective:  Neurologic Exam  Physical Exam Physical Examination:   Filed Vitals:   07/23/15 1523 07/23/15 1534  BP: 142/62 162/60  Pulse: 76 78  Resp: 18 18   General Examination: The patient is a very pleasant 82 y.o. male in no acute distress. He appears well-developed and well-nourished and very well groomed.   HEENT: Normocephalic, atraumatic, pupils are equal, round and reactive to light and accommodation. Funduscopic exam is normal with sharp disc margins noted. He is status post bilateral cataract repairs. Extraocular tracking is good without limitation to gaze excursion or nystagmus noted. Normal smooth pursuit is noted. Hearing is mildly impaired. Face is symmetric with normal facial animation and normal facial sensation. Speech is clear with no dysarthria noted. There is no hypophonia. There is no lip, neck/head, jaw or voice tremor. Neck is supple with full range of passive and active  motion. There are no carotid bruits on auscultation. Oropharynx exam reveals: moderate mouth dryness, adequate dental hygiene and mild airway crowding, due smaller airway entry and redundant soft palate. Mallampati is class II. Tongue protrudes centrally and palate elevates symmetrically.   Chest: Clear to auscultation without wheezing, rhonchi or crackles noted.  Heart: S1+S2+0, regular and normal without murmurs, rubs or gallops noted.   Abdomen: Soft, non-tender and non-distended with normal bowel sounds appreciated on auscultation.  Extremities: There is trace pitting edema in the distal lower extremities bilaterally. Pedal pulses are intact.  Skin: Warm and dry without trophic changes noted. There are no varicose veins.  Musculoskeletal: exam reveals no obvious joint deformities, tenderness or joint swelling or erythema.   Neurologically:  Mental status: The patient is awake, alert and oriented in all 4 spheres. His immediate and  remote memory, attention, language skills and fund of knowledge are appropriate. There is no evidence of aphasia, agnosia, apraxia or anomia. Speech is clear with normal prosody and enunciation. Thought process is linear. Mood is normal and affect is normal.  Cranial nerves II - XII are as described above under HEENT exam. In addition: shoulder shrug is normal with equal shoulder height noted. Motor exam: Normal bulk, strength and tone is noted in the upper extremities, in the lower extremities he has mild bilateral hip flexor weakness, right perhaps worse than left. Thigh muscles appear to be thinner than the rest of his body build would suggest. There is no drift, tremor or rebound. Romberg is  positive. Reflexes are 1+  in the upper extremities and absent in the lower extremities. Fine motor skills and coordination:  overall mildly slow in the upper and lower extremities, no significant decrement in amplitude, no lateralization. Cerebellar testing: No dysmetria or intention tremor on finger to nose testing. Heel to shin is difficult bilaterally.  unremarkable bilaterally. There is no truncal or gait ataxia.  Sensory exam: intactAll modalities including light touch, pinprick, vibration and temperature sense in the upper extremities up to the forearms but decrease in the hands and decrease in the lower extremities symmetrically up to below knees bilaterally. Gait, station and balance: He stands with difficulty. No veering to one side is noted. No leaning to one side is noted. Posture is age-appropriate to mildly stooped, stance is wide-based, he walks slowly and cautiously and relies on his cane. He turns slowly. Tandem walk is not possible.   Assessment and Plan:   In summary, Thomas Vega is a very pleasant 82 y.o.-year old male with an underlying medical history of prostate cancer (followed by Dr. Jeffie Pollock and before then by Dr. Terance Hart, since 2010 or 2011), s/p cataract removal b/l in 2015,  hypertension, arthritis, status post knee replacement surgery 2004, melanoma, s/p removal from back and and left shoulder, spinal stenosis, pneumonia in April 2016 with hospitalization from 06/28/2014 through 07/01/2014, chronic low back pain, status post epidural steroid injections, status post back surgery, who Presents for initial consultation of his gait and balance problems, fall risk, fine motor dyscontrol, with progression noted over the past 3 years. On examination, he has evidence of neuropathy, he has mild lower extremity proximal weakness, right more than left, and a nonspecific gait disorder, likely due to degenerative spine disease of the lower back, with spinal stenosis, back pain reported, peripheral neuropathy, not always hydrating well enough, normal aging. I reviewed his recent test results including his lumbar spine MRI and his laboratory test results from March 2017 through his primary care physician.  I suggested further laboratory testing including A1c, and serum protein electrophoresis. Furthermore, I suggested lower extremity EMG and nerve conduction testing. We will consider a brain MRI down the Road but I did not push for this today as his exam did not suggest any focal abnormality and his history did not suggest any strokelike symptoms or TIA symptoms. I agree with your referral to physical therapy. He is advised to use his cane at all times and consider using a walker for gait safety from now on. He can certainly discuss this with his physical therapist as well. We will keep them posted as to his test results via phone call. I will see him back routinely in a few months, sooner as needed. I answered all their questions today and the patient and his fiance were in agreement with the above outlined plan.   Thank you very much for allowing me to participate in the care of this nice patient. If I can be of any further assistance to you please do not hesitate to call me at  [phone].  Sincerely,   Star Age, MD, PhD

## 2015-07-24 LAB — MULTIPLE MYELOMA PANEL, SERUM
ALBUMIN SERPL ELPH-MCNC: 3.6 g/dL (ref 2.9–4.4)
ALBUMIN/GLOB SERPL: 1.3 (ref 0.7–1.7)
Alpha 1: 0.2 g/dL (ref 0.0–0.4)
Alpha2 Glob SerPl Elph-Mcnc: 0.5 g/dL (ref 0.4–1.0)
B-GLOBULIN SERPL ELPH-MCNC: 0.9 g/dL (ref 0.7–1.3)
GLOBULIN, TOTAL: 2.8 g/dL (ref 2.2–3.9)
Gamma Glob SerPl Elph-Mcnc: 1.2 g/dL (ref 0.4–1.8)
IgA/Immunoglobulin A, Serum: 268 mg/dL (ref 61–437)
IgG (Immunoglobin G), Serum: 1171 mg/dL (ref 700–1600)
IgM (Immunoglobulin M), Srm: 94 mg/dL (ref 15–143)
Total Protein: 6.4 g/dL (ref 6.0–8.5)

## 2015-07-24 LAB — HGB A1C W/O EAG: Hgb A1c MFr Bld: 5.5 % (ref 4.8–5.6)

## 2015-07-24 LAB — TSH: TSH: 2 u[IU]/mL (ref 0.450–4.500)

## 2015-07-24 LAB — RPR: RPR: NONREACTIVE

## 2015-07-27 ENCOUNTER — Telehealth: Payer: Self-pay | Admitting: Neurology

## 2015-07-27 NOTE — Telephone Encounter (Signed)
I spoke to patient and he is aware of results/.  

## 2015-07-27 NOTE — Telephone Encounter (Signed)
Recent labs we checked were unremarkable. Please notify patient. No further action required on these tests.

## 2015-07-28 DIAGNOSIS — M5416 Radiculopathy, lumbar region: Secondary | ICD-10-CM | POA: Diagnosis not present

## 2015-07-28 DIAGNOSIS — M4806 Spinal stenosis, lumbar region: Secondary | ICD-10-CM | POA: Diagnosis not present

## 2015-08-17 DIAGNOSIS — M4806 Spinal stenosis, lumbar region: Secondary | ICD-10-CM | POA: Diagnosis not present

## 2015-08-17 DIAGNOSIS — M5416 Radiculopathy, lumbar region: Secondary | ICD-10-CM | POA: Diagnosis not present

## 2015-08-18 ENCOUNTER — Ambulatory Visit: Payer: Medicare Other | Attending: Specialist | Admitting: Physical Therapy

## 2015-08-18 DIAGNOSIS — M545 Low back pain: Secondary | ICD-10-CM | POA: Diagnosis not present

## 2015-08-18 DIAGNOSIS — R2681 Unsteadiness on feet: Secondary | ICD-10-CM | POA: Insufficient documentation

## 2015-08-18 NOTE — Therapy (Signed)
[facility] [place], [place], [address] Phone: [phone]   Fax:  [phone]  Physical Therapy Treatment  Patient Details  Name: Thomas Vega MRN: [id number] Date of Birth: January 29, 1927 No Data Recorded  Encounter Date: 08/18/2015    Past Medical History  Diagnosis Date  . Irregular heart rate   . Melanoma ([place])   . Prostate cancer ([place])   . Bradycardia 07/01/2014  . CAP (community acquired pneumonia) 06/29/2014  . Thrombocytopenia ([place]) 06/30/2014  . Heart murmur     Past Surgical History  Procedure Laterality Date  . Back surgery    . Knee surgery    . Cataract extraction    . Melanoma excision      There were no vitals filed for this visit.                                 PT Short Term Goals - 01/01/15 1504    PT SHORT TERM GOAL #1   Title Ind with initial HEP.   Time 2   Period Weeks   Status Achieved           PT Long Term Goals - 01/01/15 1504    PT LONG TERM GOAL #1   Title Ind with advanced HEP.   Period Weeks   Status Achieved   PT LONG TERM GOAL #2   Title Patient walk 30 minutes with pain not > 4/10.   Time 6   Period Weeks   Status Not Met   PT LONG TERM GOAL #3   Title Improve Berg score to 48/56.   Time 6   Period Weeks   Status Achieved             Patient will benefit from skilled therapeutic intervention in order to improve the following deficits and impairments:     Visit Diagnosis: No diagnosis found.     Problem List Patient Active Problem List   Diagnosis Date Noted  . Bradycardia 07/01/2014  . Thrombocytopenia ([place]) 06/30/2014  . CAP (community acquired pneumonia) 06/29/2014  . Hypoxia 06/29/2014  . HTN (hypertension) 06/29/2014  . Prostate cancer ([place]) 06/29/2014  . Hyponatremia 06/29/2014   PHYSICAL THERAPY DISCHARGE SUMMARY  Visits from Start of Care:   Current functional level related to goals / functional  outcomes: Please see above.   Remaining deficits: Pain greater than 4/10 with walking > 30 minutes.   Education / Equipment: HEP.  Plan: Patient agrees to discharge.  Patient goals were partially met. Patient is being discharged due to being pleased with the current functional level.  ?????       Carmelite Violet, [place] MPT 08/18/2015, 10:53 AM  [facility] [address] [place], [place], [address] Phone: [phone]   Fax:  [phone]  Name: Thomas Vega MRN: [id number] Date of Birth: [birth year]

## 2015-08-18 NOTE — Therapy (Signed)
[facility] [place], [place], [address] Phone: [phone]   Fax:  [phone]  Physical Therapy Treatment  Patient Details  Name: Dreshawn Hendershott MRN: [id number] Date of Birth: 11-16-1926 No Data Recorded  Encounter Date: 01/01/2015    Past Medical History  Diagnosis Date  . Irregular heart rate   . Melanoma ([place])   . Prostate cancer ([place])   . Bradycardia 07/01/2014  . CAP (community acquired pneumonia) 06/29/2014  . Thrombocytopenia ([place]) 06/30/2014  . Heart murmur     Past Surgical History  Procedure Laterality Date  . Back surgery    . Knee surgery    . Cataract extraction    . Melanoma excision      There were no vitals filed for this visit.                                 PT Short Term Goals - 01/01/15 1504    PT SHORT TERM GOAL #1   Title Ind with initial HEP.   Time 2   Period Weeks   Status Achieved           PT Long Term Goals - 01/01/15 1504    PT LONG TERM GOAL #1   Title Ind with advanced HEP.   Period Weeks   Status Achieved   PT LONG TERM GOAL #2   Title Patient walk 30 minutes with pain not > 4/10.   Time 6   Period Weeks   Status Not Met   PT LONG TERM GOAL #3   Title Improve Berg score to 48/56.   Time 6   Period Weeks   Status Achieved             Patient will benefit from skilled therapeutic intervention in order to improve the following deficits and impairments:     Visit Diagnosis: Bilateral low back pain without sciatica  Unsteadiness     Problem List Patient Active Problem List   Diagnosis Date Noted  . Bradycardia 07/01/2014  . Thrombocytopenia ([place]) 06/30/2014  . CAP (community acquired pneumonia) 06/29/2014  . Hypoxia 06/29/2014  . HTN (hypertension) 06/29/2014  . Prostate cancer ([place]) 06/29/2014  . Hyponatremia 06/29/2014   PHYSICAL THERAPY DISCHARGE SUMMARY  Visits from Start of Care:   Current functional level  related to goals / functional outcomes: Please see above.   Remaining deficits: Pain > 4/10 after walking > 30 minutes.   Education / Equipment: HEP.  Plan: Patient agrees to discharge.  Patient goals were not met. Patient is being discharged due to being pleased with the current functional level.  ?????      Wrenna Saks, [place] MPT 08/18/2015, 10:56 AM  [facility] [address] [place], [place], [address] Phone: [phone]   Fax:  [phone]  Name: Rayshad Riviello MRN: [id number] Date of Birth: 28-Sep-1926

## 2015-08-18 NOTE — Therapy (Signed)
[facility] [place], [place], [address] Phone: [phone]   Fax:  [phone]  Physical Therapy Evaluation  Patient Details  Name: Thomas Vega MRN: [id number] Date of Birth: 1926/10/24 Referring Provider: Basil Dess MD.  Encounter Date: 08/18/2015      PT End of Session - 08/18/15 1156    Visit Number 1   Number of Visits 16   Date for PT Re-Evaluation 10/17/15   PT Start Time [id number]   PT Stop Time 1130   PT Time Calculation (min) 46 min      Past Medical History  Diagnosis Date  . Irregular heart rate   . Melanoma ([place])   . Prostate cancer ([place])   . Bradycardia 07/01/2014  . CAP (community acquired pneumonia) 06/29/2014  . Thrombocytopenia ([place]) 06/30/2014  . Heart murmur     Past Surgical History  Procedure Laterality Date  . Back surgery    . Knee surgery    . Cataract extraction    . Melanoma excision      There were no vitals filed for this visit.       Subjective Assessment - 08/18/15 1148    Subjective The last injection in my back helped a lot.   Patient Stated Goals Improve my balance and walking.   Pain Score 5    Pain Location Back   Pain Orientation Lower   Pain Descriptors / Indicators Sore   Pain Onset More than a month ago   Pain Frequency Intermittent   Aggravating Factors  Overactivity.   Pain Relieving Factors Resting.            [facility] PT Assessment - 08/18/15 0001    Assessment   Medical Diagnosis Severe lumbar stenosis.   Referring Provider Basil Dess MD.   Onset Date/Surgical Date --  Ongoing.   Precautions   Precautions Fall   Precaution Comments --  Kasandra Knudsen usage at all times.   Restrictions   Weight Bearing Restrictions No   Balance Screen   Has the patient fallen in the past 6 months Yes   How many times? --  2   Has the patient had a decrease in activity level because of a fear of falling?  Yes   Is the patient reluctant to leave their home because of a  fear of falling?  Yes   [place] residence   Prior Function   Level of Independence Independent   Posture/Postural Control   Posture Comments The patient stands in lumbar flexion.   ROM / Strength   AROM / PROM / Strength --  WFL for bil LE's.   Strength   Overall Strength Comments Bilateral LE strength= 4 to 4+/5.   Ambulation/Gait   Gait Comments The patient ambulates with a straight cane on right for safety.  Mild gait ataxia is noted.   Berg Balance Test   Sit to Stand Able to stand without using hands and stabilize independently   Standing Unsupported Able to stand 2 minutes with supervision   Sitting with Back Unsupported but Feet Supported on Floor or Stool Able to sit safely and securely 2 minutes   Stand to Sit Controls descent by using hands   Transfers Able to transfer safely, definite need of hands   Standing Unsupported with Eyes Closed Able to stand 3 seconds   Standing Ubsupported with Feet Together Able to place feet together independently but unable to hold for 30 seconds  From Standing, Reach Forward with Outstretched Arm Can reach confidently >25 cm (10")   From Standing Position, Pick up Object from [place] to pick up shoe, needs supervision   From Standing Position, Turn to Look Behind Over each Shoulder Looks behind one side only/other side shows less weight shift   Turn 360 Degrees Able to turn 360 degrees safely one side only in 4 seconds or less   Standing Unsupported, Alternately Place Feet on Step/Stool Able to complete 4 steps without aid or supervision   Standing Unsupported, One Foot in Front Needs help to step but can hold 15 seconds   Standing on One Leg Unable to try or needs assist to prevent fall   Total Score 37                   [facility] Adult PT Treatment/Exercise - 08/18/15 0001    Exercises   Exercises Knee/Hip   Lumbar Exercises: Aerobic   Stationary Bike --  Nustep level 4 x 10 minutes.                   PT Short Term Goals - 08/18/15 1155    PT SHORT TERM GOAL #1   Title Ind with initial HEP.   Time 2   Period Weeks   Status New           PT Long Term Goals - 08/18/15 1155    PT LONG TERM GOAL #1   Title Ind with advanced HEP.   Time 8   Period Weeks   Status New   PT LONG TERM GOAL #2   Title Patient walk 30 minutes with pain not > 4/10.   Time 8   Period Weeks   Status New   PT LONG TERM GOAL #3   Title Improve Berg score to 48/56.   Time 8   Period Weeks   Status New               Plan - 08/18/15 1149    Clinical Impression Statement The patient has had ongoing balance problems over the last few years.  He is now using a cane full-time as he has fallen twice in the last 6 months.  He is pleased to reports that a recent injection in his low back decreased his pain and he hopes this will improve his function.  He is motivated to improve and wants to walk better.  His Berg score is reduced compared to previous testing.     PT Frequency 2x / week   PT Duration 8 weeks   PT Treatment/Interventions ADLs/Self Care Home Management;Neuromuscular re-education;Therapeutic exercise;Gait training;Patient/family education   PT Next Visit Plan Bilateral LE strengthening; Gait and balance training.   Consulted and Agree with Plan of Care Patient      Patient will benefit from skilled therapeutic intervention in order to improve the following deficits and impairments:  Pain, Decreased activity tolerance, Decreased strength, Decreased coordination  Visit Diagnosis: Unsteadiness on feet - Plan: PT plan of care cert/re-cert      G-Codes - [id number]    Functional Assessment Tool Used Clinical judgement...   Functional Limitation Mobility: Walking and moving around   Mobility: Walking and Moving Around Current Status [id number]) At least 40 percent but less than 60 percent impaired, limited or restricted   Mobility: Walking and Moving Around Goal  Status [id number]) At least 20 percent but less than 40 percent impaired, limited or restricted  Problem List Patient Active Problem List   Diagnosis Date Noted  . Bradycardia 07/01/2014  . Thrombocytopenia ([place]) 06/30/2014  . CAP (community acquired pneumonia) 06/29/2014  . Hypoxia 06/29/2014  . HTN (hypertension) 06/29/2014  . Prostate cancer ([place]) 06/29/2014  . Hyponatremia 06/29/2014    Emersyn Wyss, [place] MPT 08/18/2015, 11:59 AM  [facility] [place], [place], [address] Phone: [phone]   Fax:  [phone]  Name: Thomas Vega MRN: [id number] Date of Birth: [birth year]

## 2015-08-19 ENCOUNTER — Encounter: Payer: PRIVATE HEALTH INSURANCE | Admitting: Neurology

## 2015-08-25 ENCOUNTER — Ambulatory Visit: Payer: Medicare Other | Admitting: *Deleted

## 2015-08-25 DIAGNOSIS — M545 Low back pain, unspecified: Secondary | ICD-10-CM

## 2015-08-25 DIAGNOSIS — R2681 Unsteadiness on feet: Secondary | ICD-10-CM | POA: Diagnosis not present

## 2015-08-25 NOTE — Therapy (Signed)
[facility] [place], [place], [address] Phone: [phone]   Fax:  [phone]  Physical Therapy Treatment  Patient Details  Name: Thomas Vega MRN: [id number] Date of Birth: [birth year] Referring Provider: Basil Dess MD.  Encounter Date: 08/25/2015      PT End of Session - 08/25/15 1321    Visit Number 2   Number of Visits 16   Date for PT Re-Evaluation 10/17/15   PT Start Time 1300   PT Stop Time [id number]   PT Time Calculation (min) 49 min      Past Medical History  Diagnosis Date  . Irregular heart rate   . Melanoma ([place])   . Prostate cancer ([place])   . Bradycardia 07/01/2014  . CAP (community acquired pneumonia) 06/29/2014  . Thrombocytopenia ([place]) 06/30/2014  . Heart murmur     Past Surgical History  Procedure Laterality Date  . Back surgery    . Knee surgery    . Cataract extraction    . Melanoma excision      There were no vitals filed for this visit.      Subjective Assessment - 08/25/15 1320    Subjective The last injection in my back helped a lot.   Patient Stated Goals Improve my balance and walking.   Currently in Pain? Yes   Pain Score 2    Pain Location Back   Pain Orientation Lower   Pain Descriptors / Indicators Sore   Pain Onset More than a month ago   Pain Frequency Intermittent                         [facility] Adult PT Treatment/Exercise - 08/25/15 0001    Exercises   Exercises Knee/Hip   Knee/Hip Exercises: Aerobic   Nustep L5 x 21 mins 1500 steps   Knee/Hip Exercises: Standing   Rocker Board 5 minutes  balance and calf stretching, also x 5 mins side to side   Other Standing Knee Exercises toe taps on 6 inch step 3 x20 forward,and 2x10 side  Both with manual CGA   Other Standing Knee Exercises sit to stand 3x10                  PT Short Term Goals - 08/18/15 1155    PT SHORT TERM GOAL #1   Title Ind with initial HEP.   Time 2   Period Weeks   Status New           PT Long Term Goals - 08/18/15 1155    PT LONG TERM GOAL #1   Title Ind with advanced HEP.   Time 8   Period Weeks   Status New   PT LONG TERM GOAL #2   Title Patient walk 30 minutes with pain not > 4/10.   Time 8   Period Weeks   Status New   PT LONG TERM GOAL #3   Title Improve Berg score to 48/56.   Time 8   Period Weeks   Status New               Plan - 08/25/15 1324    Clinical Impression Statement Pt did fairly well with strengthening and balance acts. today. He still needs CGA when performing exs in //bars due to LOB. He was able to perform sit to stand without use of UEs, but had LOB x3. Goals are ongoing   PT Frequency 2x / week  PT Duration 8 weeks   PT Treatment/Interventions ADLs/Self Care Home Management;Neuromuscular re-education;Therapeutic exercise;Gait training;Patient/family education   PT Next Visit Plan Bilateral LE strengthening; Gait and balance training.   Consulted and Agree with Plan of Care Patient      Patient will benefit from skilled therapeutic intervention in order to improve the following deficits and impairments:  Pain, Decreased activity tolerance, Decreased strength, Decreased coordination  Visit Diagnosis: Unsteadiness on feet  Bilateral low back pain without sciatica     Problem List Patient Active Problem List   Diagnosis Date Noted  . Bradycardia 07/01/2014  . Thrombocytopenia ([place]) 06/30/2014  . CAP (community acquired pneumonia) 06/29/2014  . Hypoxia 06/29/2014  . HTN (hypertension) 06/29/2014  . Prostate cancer ([place]) 06/29/2014  . Hyponatremia 06/29/2014    Jazari Ober,CHRIS, PTA 08/25/2015, 2:02 PM  [facility] [place], [place], [address] Phone: [phone]   Fax:  [phone]  Name: Thomas Vega MRN: [id number] Date of Birth: 04-27-1926

## 2015-09-01 ENCOUNTER — Ambulatory Visit: Payer: Medicare Other | Attending: Specialist | Admitting: Physical Therapy

## 2015-09-01 ENCOUNTER — Encounter: Payer: Self-pay | Admitting: Physical Therapy

## 2015-09-01 DIAGNOSIS — M545 Low back pain: Secondary | ICD-10-CM | POA: Diagnosis not present

## 2015-09-01 DIAGNOSIS — R2681 Unsteadiness on feet: Secondary | ICD-10-CM | POA: Insufficient documentation

## 2015-09-01 NOTE — Therapy (Signed)
[facility] [place], [place], [address] Phone: [phone]   Fax:  [phone]  Physical Therapy Treatment  Patient Details  Name: Thomas Vega MRN: [id number] Date of Birth: 08/15/1926 Referring Provider: Basil Dess MD.  Encounter Date: 09/01/2015      PT End of Session - 09/01/15 1303    Visit Number 3   Number of Visits 16   Date for PT Re-Evaluation 10/17/15   PT Start Time 1301   PT Stop Time 1349   PT Time Calculation (min) 48 min   Activity Tolerance Patient tolerated treatment well   Behavior During Therapy [facility] for tasks assessed/performed      Past Medical History  Diagnosis Date  . Irregular heart rate   . Melanoma ([place])   . Prostate cancer ([place])   . Bradycardia 07/01/2014  . CAP (community acquired pneumonia) 06/29/2014  . Thrombocytopenia ([place]) 06/30/2014  . Heart murmur     Past Surgical History  Procedure Laterality Date  . Back surgery    . Knee surgery    . Cataract extraction    . Melanoma excision      There were no vitals filed for this visit.      Subjective Assessment - 09/01/15 1302    Subjective Reports that he is here mainly for balance since last injection has helped so much.   Patient Stated Goals Improve my balance and walking.   Currently in Pain? No/denies            [facility] PT Assessment - 09/01/15 0001    Assessment   Medical Diagnosis Severe lumbar stenosis.   Precautions   Precautions Fall   Restrictions   Weight Bearing Restrictions No                     [facility] Adult PT Treatment/Exercise - 09/01/15 0001    Knee/Hip Exercises: Aerobic   Nustep L5 x 21 mins              Balance Exercises - 09/01/15 1333    Balance Exercises: Standing   Standing Eyes Closed Wide (BOA);Foam/compliant surface  x4 min   Rockerboard Anterior/posterior;Lateral;EO;Intermittent UE support  x4 min each   Step Ups Forward;6 inch;Intermittent UE support  x20  reps each   Other Standing Exercises 4 way Pink XTS walking x10 reps each             PT Short Term Goals - 08/18/15 1155    PT SHORT TERM GOAL #1   Title Ind with initial HEP.   Time 2   Period Weeks   Status New           PT Long Term Goals - 08/18/15 1155    PT LONG TERM GOAL #1   Title Ind with advanced HEP.   Time 8   Period Weeks   Status New   PT LONG TERM GOAL #2   Title Patient walk 30 minutes with pain not > 4/10.   Time 8   Period Weeks   Status New   PT LONG TERM GOAL #3   Title Improve Berg score to 48/56.   Time 8   Period Weeks   Status New               Plan - 09/01/15 1356    Clinical Impression Statement Patient continues to do fairly well with PT without reports of pain. Patient required supervision or CGA for balance activities. Patient  had LOB with parallel bar activities as he had LOB in all directions today. Patient especially had difficulty with airex stance with eyes closed.Marland Kitchen   PT Frequency 2x / week   PT Duration 8 weeks   PT Treatment/Interventions ADLs/Self Care Home Management;Neuromuscular re-education;Therapeutic exercise;Gait training;Patient/family education   PT Next Visit Plan Bilateral LE strengthening; Gait and balance training.   Consulted and Agree with Plan of Care Patient      Patient will benefit from skilled therapeutic intervention in order to improve the following deficits and impairments:  Pain, Decreased activity tolerance, Decreased strength, Decreased coordination  Visit Diagnosis: Unsteadiness on feet     Problem List Patient Active Problem List   Diagnosis Date Noted  . Bradycardia 07/01/2014  . Thrombocytopenia ([place]) 06/30/2014  . CAP (community acquired pneumonia) 06/29/2014  . Hypoxia 06/29/2014  . HTN (hypertension) 06/29/2014  . Prostate cancer ([employer]) 06/29/2014  . Hyponatremia 06/29/2014    Wynelle Fanny, PTA 09/01/2015, 2:01 PM  [facility] [address] [place], [place], [address] Phone: [phone]   Fax:  [phone]  Name: Thomas Vega MRN: [id number] Date of Birth: 04/27/1926

## 2015-09-02 ENCOUNTER — Ambulatory Visit (INDEPENDENT_AMBULATORY_CARE_PROVIDER_SITE_OTHER): Payer: Self-pay | Admitting: Neurology

## 2015-09-02 ENCOUNTER — Ambulatory Visit (INDEPENDENT_AMBULATORY_CARE_PROVIDER_SITE_OTHER): Payer: Medicare Other | Admitting: Neurology

## 2015-09-02 DIAGNOSIS — M48061 Spinal stenosis, lumbar region without neurogenic claudication: Secondary | ICD-10-CM

## 2015-09-02 DIAGNOSIS — G609 Hereditary and idiopathic neuropathy, unspecified: Secondary | ICD-10-CM

## 2015-09-02 DIAGNOSIS — M4806 Spinal stenosis, lumbar region: Secondary | ICD-10-CM | POA: Diagnosis not present

## 2015-09-02 DIAGNOSIS — R29898 Other symptoms and signs involving the musculoskeletal system: Secondary | ICD-10-CM

## 2015-09-02 DIAGNOSIS — M4800 Spinal stenosis, site unspecified: Secondary | ICD-10-CM | POA: Insufficient documentation

## 2015-09-02 DIAGNOSIS — R200 Anesthesia of skin: Secondary | ICD-10-CM

## 2015-09-02 DIAGNOSIS — R2689 Other abnormalities of gait and mobility: Secondary | ICD-10-CM

## 2015-09-02 DIAGNOSIS — Z0289 Encounter for other administrative examinations: Secondary | ICD-10-CM

## 2015-09-02 NOTE — Progress Notes (Signed)
   STUDY DATE: 09/02/2015 PATIENT NAME: Thomas Vega DOB: 03-Feb-1927 MRN: [id number] Referred by Dr. Louanne Skye.  HISTORY:  Mr. Goods is a 82 year old man who reports numbness below his knees and in the fingertips bilaterally and fairly symmetrically. He notes a history of lumbar surgery. He has known lumbar spinal stenosis.  NERVE CONDUCTION STUDIES:  Bilateral peroneal motor responses had very reduced amplitudes with delayed distal latencies but normal conduction velocities.   The right posterior tibial motor response had very reduced amplitudes delayed distal latency and normal conduction velocity. The left posterior tibial motor response was absent. Bilateral H reflexes responses were absent. Bilateral peroneal sensory responses were absent.  The left median motor response had a mild delayed distal latency with normal forearm conduction and normal amplitude. Left ulnar motor response was normal. The left median sensory response had a mildly prolonged distal latency with normal amplitude. The ulnar sensory response was normal.  EMG STUDIES:  Needle EMG of muscles of the left leg was performed. The gluteus medius, iliopsoas, vastus medialis psoas oh showed an increased number of large polyphasic motor units with mild to moderate neuropathic recruitment. The peroneus longus, tibialis anterior and gastrocnemius muscles showed an increased number of large polyphasic motor units with moderate neuropathic recruitment. There was no spontaneous activity in any of these muscles. The extensor hallucis longus muscle showed large polyphasic motor units recruited in a severe neuropathic pattern (discrete).   Additionally, there was 1+ fibrillation potentials.  Needle EMG of the first dorsal interosseous, biceps and triceps was also performed. Motor unit morphology and recruitment was normal. There was no spontaneous activity.  IMPRESSION:  This EMG/EMG of the showed the following: 1.    Proximal and  distal leg muscle tested showed moderate to severe chronic elevation consistent with multiple radiculopathies as might be seen with multilevel spinal stenosis.   2.    NCV findings and the length dependent severity of neuropathic changes is consistent with a superimposed axonal sensory and motor polyneuropathy.    The hand was not involved in the neuropathy and thus the severity is more likely to be mild to moderate.    3.    Mild left median neuropathy at the wrist.   Kayleen Alig A. Felecia Shelling, MD, PhD Certified in Neurology, [place] [facility], Sleep Medicine, Pain Medicine and Neuroimaging  [facility] [address] [place], [place] [address] [phone]

## 2015-09-03 ENCOUNTER — Ambulatory Visit: Payer: Medicare Other | Admitting: *Deleted

## 2015-09-03 DIAGNOSIS — M545 Low back pain, unspecified: Secondary | ICD-10-CM

## 2015-09-03 DIAGNOSIS — R2681 Unsteadiness on feet: Secondary | ICD-10-CM | POA: Diagnosis not present

## 2015-09-03 NOTE — Therapy (Signed)
[facility] [place], [place], [address] Phone: [phone]   Fax:  [phone]  Physical Therapy Treatment  Patient Details  Name: Thomas Vega MRN: [id number] Date of Birth: 06/20/1926 Referring Provider: Basil Dess MD.  Encounter Date: 09/03/2015      PT End of Session - 09/03/15 1331    Visit Number 4   Number of Visits 16   Date for PT Re-Evaluation 10/17/15   PT Start Time 1300   PT Stop Time 1350   PT Time Calculation (min) 50 min      Past Medical History  Diagnosis Date  . Irregular heart rate   . Melanoma ([place])   . Prostate cancer ([place])   . Bradycardia 07/01/2014  . CAP (community acquired pneumonia) 06/29/2014  . Thrombocytopenia ([place]) 06/30/2014  . Heart murmur     Past Surgical History  Procedure Laterality Date  . Back surgery    . Knee surgery    . Cataract extraction    . Melanoma excision      There were no vitals filed for this visit.      Subjective Assessment - 09/03/15 1327    Subjective Reports that he is here mainly for balance since last injection has helped so much.   Patient Stated Goals Improve my balance and walking.   Currently in Pain? No/denies                         [facility] Adult PT Treatment/Exercise - 09/03/15 0001    Exercises   Exercises Knee/Hip   Knee/Hip Exercises: Aerobic   Nustep L5 x 20 mins UE and LE activity monitored for tolerance and progression   Knee/Hip Exercises: Standing   Rocker Board 5 minutes  balance and calf stretching, also x 5 mins side to side   Other Standing Knee Exercises toe taps on 6 inch step 3 x20 forward,and 2x10 side  Both with manual CGA             Balance Exercises - 09/03/15 1330    Balance Exercises: Standing   Standing Eyes Closed Wide (BOA);Foam/compliant surface;Solid surface  x4 min             PT Short Term Goals - 08/18/15 1155    PT SHORT TERM GOAL #1   Title Ind with initial HEP.    Time 2   Period Weeks   Status New           PT Long Term Goals - 08/18/15 1155    PT LONG TERM GOAL #1   Title Ind with advanced HEP.   Time 8   Period Weeks   Status New   PT LONG TERM GOAL #2   Title Patient walk 30 minutes with pain not > 4/10.   Time 8   Period Weeks   Status New   PT LONG TERM GOAL #3   Title Improve Berg score to 48/56.   Time 8   Period Weeks   Status New               Plan - 09/03/15 1332    Clinical Impression Statement Pt did fairly well today and was able to perform balance exs and act.'s with less assistance to regain balance, but still needs SBA and CGA during all act.'s. He is most challenged and has difficulties with eyes closed   PT Frequency 2x / week   PT Duration 8 weeks  PT Treatment/Interventions ADLs/Self Care Home Management;Neuromuscular re-education;Therapeutic exercise;Gait training;Patient/family education   PT Next Visit Plan Bilateral LE strengthening; Gait and balance training.   Consulted and Agree with Plan of Care Patient      Patient will benefit from skilled therapeutic intervention in order to improve the following deficits and impairments:  Pain, Decreased activity tolerance, Decreased strength, Decreased coordination  Visit Diagnosis: Unsteadiness on feet  Bilateral low back pain without sciatica     Problem List Patient Active Problem List   Diagnosis Date Noted  . Spinal stenosis 09/02/2015  . Numbness 09/02/2015  . Bradycardia 07/01/2014  . Thrombocytopenia ([place]) 06/30/2014  . CAP (community acquired pneumonia) 06/29/2014  . Hypoxia 06/29/2014  . HTN (hypertension) 06/29/2014  . Prostate cancer ([place]) 06/29/2014  . Hyponatremia 06/29/2014    RAMSEUR,CHRIS, PTA 09/03/2015, 2:31 PM  [facility] [address] [place], [place], [address] Phone: [phone]   Fax:  [phone]  Name: Thomas Vega MRN: [id number] Date of Birth:  [birth year]

## 2015-09-07 ENCOUNTER — Ambulatory Visit: Payer: Medicare Other | Admitting: *Deleted

## 2015-09-07 DIAGNOSIS — R2681 Unsteadiness on feet: Secondary | ICD-10-CM

## 2015-09-07 DIAGNOSIS — M545 Low back pain, unspecified: Secondary | ICD-10-CM

## 2015-09-07 NOTE — Therapy (Signed)
[facility] [place], [place], [address] Phone: [phone]   Fax:  [phone]  Physical Therapy Treatment  Patient Details  Name: Thomas Vega MRN: [id number] Date of Birth: 02-19-1927 Referring Provider: Basil Dess MD.  Encounter Date: 09/07/2015      PT End of Session - 09/07/15 1356    Visit Number 5   Number of Visits 16   PT Start Time 1300   PT Stop Time 1357   PT Time Calculation (min) 57 min      Past Medical History  Diagnosis Date  . Irregular heart rate   . Melanoma ([place])   . Prostate cancer ([place])   . Bradycardia 07/01/2014  . CAP (community acquired pneumonia) 06/29/2014  . Thrombocytopenia ([place]) 06/30/2014  . Heart murmur     Past Surgical History  Procedure Laterality Date  . Back surgery    . Knee surgery    . Cataract extraction    . Melanoma excision      There were no vitals filed for this visit.      Subjective Assessment - 09/07/15 1317    Subjective Reports that he is here mainly for balance since last injection has helped so much. LB was a little sore after last RX   Patient Stated Goals Improve my balance and walking.   Currently in Pain? No/denies                         [facility] Adult PT Treatment/Exercise - 09/07/15 0001    Exercises   Exercises Knee/Hip   Knee/Hip Exercises: Aerobic   Nustep L4 x 20 mins UE and LE activity monitored for tolerance and progression   Knee/Hip Exercises: Standing   Rocker Board 5 minutes  balance and calf stretching, also x 5 mins side to side   Other Standing Knee Exercises toe taps on 6 inch step 3 x20 forward,and 2x10 side  Both with manual CGA             Balance Exercises - 09/07/15 1331    Balance Exercises: Standing   Standing Eyes Closed Wide (BOA);Foam/compliant surface;Solid surface  x4 min   Other Standing Exercises 4 way Pink XTS walking x10 reps each             PT Short Term Goals - 08/18/15  1155    PT SHORT TERM GOAL #1   Title Ind with initial HEP.   Time 2   Period Weeks   Status New           PT Long Term Goals - 08/18/15 1155    PT LONG TERM GOAL #1   Title Ind with advanced HEP.   Time 8   Period Weeks   Status New   PT LONG TERM GOAL #2   Title Patient walk 30 minutes with pain not > 4/10.   Time 8   Period Weeks   Status New   PT LONG TERM GOAL #3   Title Improve Berg score to 48/56.   Time 8   Period Weeks   Status New               Plan - 09/07/15 1357    Clinical Impression Statement Pt did fairly well today with exs and act's with no C/O increased LBP. He feels he is doing better with walking and balance today. He also did better with eyes closed exs, but this is still the  most challenging.   PT Frequency 2x / week   PT Duration 8 weeks   PT Treatment/Interventions ADLs/Self Care Home Management;Neuromuscular re-education;Therapeutic exercise;Gait training;Patient/family education   PT Next Visit Plan Bilateral LE strengthening; Gait and balance training.   Consulted and Agree with Plan of Care Patient      Patient will benefit from skilled therapeutic intervention in order to improve the following deficits and impairments:  Pain, Decreased activity tolerance, Decreased strength, Decreased coordination  Visit Diagnosis: Unsteadiness on feet  Bilateral low back pain without sciatica     Problem List Patient Active Problem List   Diagnosis Date Noted  . Spinal stenosis 09/02/2015  . Numbness 09/02/2015  . Bradycardia 07/01/2014  . Thrombocytopenia ([place]) 06/30/2014  . CAP (community acquired pneumonia) 06/29/2014  . Hypoxia 06/29/2014  . HTN (hypertension) 06/29/2014  . Prostate cancer ([place]) 06/29/2014  . Hyponatremia 06/29/2014    Benjermin Korber,CHRIS, PTA 09/07/2015, 2:04 PM  [facility] [place], [place], [address] Phone: [phone]   Fax:  [phone]  Name:  Riggins Hanson MRN: [id number] Date of Birth: 01-Apr-1926

## 2015-09-08 DIAGNOSIS — H353112 Nonexudative age-related macular degeneration, right eye, intermediate dry stage: Secondary | ICD-10-CM | POA: Diagnosis not present

## 2015-09-08 DIAGNOSIS — H353122 Nonexudative age-related macular degeneration, left eye, intermediate dry stage: Secondary | ICD-10-CM | POA: Diagnosis not present

## 2015-09-08 DIAGNOSIS — Z961 Presence of intraocular lens: Secondary | ICD-10-CM | POA: Diagnosis not present

## 2015-09-08 DIAGNOSIS — H35032 Hypertensive retinopathy, left eye: Secondary | ICD-10-CM | POA: Diagnosis not present

## 2015-09-10 ENCOUNTER — Ambulatory Visit: Payer: Medicare Other | Admitting: *Deleted

## 2015-09-10 DIAGNOSIS — R2681 Unsteadiness on feet: Secondary | ICD-10-CM

## 2015-09-10 DIAGNOSIS — M545 Low back pain, unspecified: Secondary | ICD-10-CM

## 2015-09-10 NOTE — Therapy (Signed)
[facility] [place], [place], [address] Phone: [phone]   Fax:  [phone]  Physical Therapy Treatment  Patient Details  Name: Thomas Vega MRN: [id number] Date of Birth: [birth year] Referring Provider: Basil Dess MD.  Encounter Date: 09/10/2015      PT End of Session - 09/10/15 1307    Visit Number 6   Number of Visits 16   Date for PT Re-Evaluation 10/17/15   PT Start Time [id number]   PT Stop Time 1350   PT Time Calculation (min) 48 min      Past Medical History  Diagnosis Date  . Irregular heart rate   . Melanoma ([place])   . Prostate cancer ([place])   . Bradycardia 07/01/2014  . CAP (community acquired pneumonia) 06/29/2014  . Thrombocytopenia ([place]) 06/30/2014  . Heart murmur     Past Surgical History  Procedure Laterality Date  . Back surgery    . Knee surgery    . Cataract extraction    . Melanoma excision      There were no vitals filed for this visit.      Subjective Assessment - 09/10/15 1306    Subjective Reports that he is here mainly for balance since last injection has helped so much. LB did goodafter last Rx and is feeling good today.   Patient Stated Goals Improve my balance and walking.   Currently in Pain? No/denies                         [facility] Adult PT Treatment/Exercise - 09/10/15 0001    Exercises   Exercises Knee/Hip   Knee/Hip Exercises: Aerobic   Nustep L4 x 20 mins UE and LE activity monitored for tolerance and progression   Knee/Hip Exercises: Standing   Rocker Board 5 minutes  balance and calf stretching, also x 5 mins side to side   Other Standing Knee Exercises toe taps on 6 inch step 3 x20 forward,and 2x10 side  Both with manual CGA             Balance Exercises - 09/10/15 1309    Balance Exercises: Standing   Standing Eyes Closed Wide (BOA);Foam/compliant surface;Solid surface  x4 min   Other Standing Exercises 4 way Pink XTS walking x10 reps each              PT Short Term Goals - 09/10/15 1352    PT SHORT TERM GOAL #1   Title Ind with initial HEP.   Time 2   Period Weeks   Status Achieved           PT Long Term Goals - 09/10/15 1352    PT LONG TERM GOAL #1   Title Ind with advanced HEP.   Time 8   Period Weeks   Status On-going   PT LONG TERM GOAL #2   Title Patient walk 30 minutes with pain not > 4/10.   Time 8   Period Weeks   Status On-going   PT LONG TERM GOAL #3   Title Improve Berg score to 48/56.   Time 8   Period Weeks   Status On-going               Plan - 09/10/15 1356    Clinical Impression Statement Pt did fairly well with Rx today and was able to perform all balance exs with less CGA and more SBA. He also felt more stable during ambulation today  as per pt. He was unable to meet LTG for ambulation due to LBP > 4/10 and balance deficits   PT Frequency 2x / week   PT Duration 8 weeks   PT Treatment/Interventions ADLs/Self Care Home Management;Neuromuscular re-education;Therapeutic exercise;Gait training;Patient/family education   PT Next Visit Plan Bilateral LE strengthening; Gait and balance training.    Berg Test next Rx   Consulted and Agree with Plan of Care Patient      Patient will benefit from skilled therapeutic intervention in order to improve the following deficits and impairments:  Pain, Decreased activity tolerance, Decreased strength, Decreased coordination  Visit Diagnosis: Unsteadiness on feet  Bilateral low back pain without sciatica     Problem List Patient Active Problem List   Diagnosis Date Noted  . Spinal stenosis 09/02/2015  . Numbness 09/02/2015  . Bradycardia 07/01/2014  . Thrombocytopenia ([place]) 06/30/2014  . CAP (community acquired pneumonia) 06/29/2014  . Hypoxia 06/29/2014  . HTN (hypertension) 06/29/2014  . Prostate cancer ([place]) 06/29/2014  . Hyponatremia 06/29/2014    RAMSEUR,CHRIS, PTA 09/10/2015, 2:04 PM  [facility] [place], [place], [address] Phone: [phone]   Fax:  [phone]  Name: Thomas Vega MRN: [id number] Date of Birth: [birth year]

## 2015-09-14 ENCOUNTER — Ambulatory Visit: Payer: Medicare Other | Admitting: *Deleted

## 2015-09-14 DIAGNOSIS — R2681 Unsteadiness on feet: Secondary | ICD-10-CM | POA: Diagnosis not present

## 2015-09-14 DIAGNOSIS — M545 Low back pain, unspecified: Secondary | ICD-10-CM

## 2015-09-14 NOTE — Therapy (Signed)
[facility] [place], [place], [address] Phone: [phone]   Fax:  [phone]  Physical Therapy Treatment  Patient Details  Name: Thomas Vega MRN: [id number] Date of Birth: [birth year] Referring Provider: Basil Dess MD.  Encounter Date: 09/14/2015      PT End of Session - 09/14/15 1331    Visit Number 7   Number of Visits 16   Date for PT Re-Evaluation 10/17/15   PT Start Time 1300   PT Stop Time [id number]   PT Time Calculation (min) 59 min      Past Medical History  Diagnosis Date  . Irregular heart rate   . Melanoma ([place])   . Prostate cancer ([place])   . Bradycardia 07/01/2014  . CAP (community acquired pneumonia) 06/29/2014  . Thrombocytopenia ([place]) 06/30/2014  . Heart murmur     Past Surgical History  Procedure Laterality Date  . Back surgery    . Knee surgery    . Cataract extraction    . Melanoma excision      There were no vitals filed for this visit.      Subjective Assessment - 09/14/15 1326    Subjective Reports that he is here mainly for balance since last injection has helped so much. LB did goodafter last Rx and is feeling good today.   Patient Stated Goals Improve my balance and walking.   Currently in Pain? No/denies                         [facility] Adult PT Treatment/Exercise - 09/14/15 0001    Exercises   Exercises Knee/Hip   Knee/Hip Exercises: Aerobic   Nustep L4 x 20 mins UE and LE activity monitored for tolerance and progression 1500 steps   Knee/Hip Exercises: Standing   Rocker Board 5 minutes  balance and calf stretching, also x 5 mins side to side   Other Standing Knee Exercises toe taps on 6 inch step 3 x20 forward,and 2x10 side  Both with manual CGA             Balance Exercises - 09/14/15 1333    Balance Exercises: Standing   Standing Eyes Closed Wide (BOA);Foam/compliant surface;Solid surface  x4 min   Other Standing Exercises 4 way Pink XTS walking x10  reps each             PT Short Term Goals - 09/10/15 1352    PT SHORT TERM GOAL #1   Title Ind with initial HEP.   Time 2   Period Weeks   Status Achieved           PT Long Term Goals - 09/10/15 1352    PT LONG TERM GOAL #1   Title Ind with advanced HEP.   Time 8   Period Weeks   Status On-going   PT LONG TERM GOAL #2   Title Patient walk 30 minutes with pain not > 4/10.   Time 8   Period Weeks   Status On-going   PT LONG TERM GOAL #3   Title Improve Berg score to 48/56.   Time 8   Period Weeks   Status On-going               Plan - 09/14/15 1331    Clinical Impression Statement Pt did fairly well today with exs and balance acts. He did better with less CGA and more SBA during balance exs. He is still challenged the  most by eyes closed act's and toe touches. He had only 1 LOB today during resisted walking.   Berg test next RX   PT Frequency 2x / week   PT Duration 8 weeks   PT Next Visit Plan Bilateral LE strengthening; Gait and balance training.                  Berg Test next Rx to check goal   Consulted and Agree with Plan of Care Patient      Patient will benefit from skilled therapeutic intervention in order to improve the following deficits and impairments:  Pain, Decreased activity tolerance, Decreased strength, Decreased coordination  Visit Diagnosis: Unsteadiness on feet  Bilateral low back pain without sciatica     Problem List Patient Active Problem List   Diagnosis Date Noted  . Spinal stenosis 09/02/2015  . Numbness 09/02/2015  . Bradycardia 07/01/2014  . Thrombocytopenia ([place]) 06/30/2014  . CAP (community acquired pneumonia) 06/29/2014  . Hypoxia 06/29/2014  . HTN (hypertension) 06/29/2014  . Prostate cancer ([place]) 06/29/2014  . Hyponatremia 06/29/2014    Thomas Vega,CHRIS, PTA 09/14/2015, 2:06 PM  [facility] [place], [place], [address] Phone: [phone]   Fax:   [phone]  Name: Thomas Vega MRN: [id number] Date of Birth: 06/13/1926

## 2015-09-15 DIAGNOSIS — D235 Other benign neoplasm of skin of trunk: Secondary | ICD-10-CM | POA: Diagnosis not present

## 2015-09-15 DIAGNOSIS — L821 Other seborrheic keratosis: Secondary | ICD-10-CM | POA: Diagnosis not present

## 2015-09-15 DIAGNOSIS — L814 Other melanin hyperpigmentation: Secondary | ICD-10-CM | POA: Diagnosis not present

## 2015-09-15 DIAGNOSIS — Z85820 Personal history of malignant melanoma of skin: Secondary | ICD-10-CM | POA: Diagnosis not present

## 2015-09-17 ENCOUNTER — Ambulatory Visit: Payer: Medicare Other | Admitting: Physical Therapy

## 2015-09-17 DIAGNOSIS — M545 Low back pain, unspecified: Secondary | ICD-10-CM

## 2015-09-17 DIAGNOSIS — R2681 Unsteadiness on feet: Secondary | ICD-10-CM

## 2015-09-17 NOTE — Therapy (Signed)
[facility] [place], [place], [address] Phone: [phone]   Fax:  [phone]  Physical Therapy Treatment  Patient Details  Name: Thomas Vega MRN: [id number] Date of Birth: 09/11/1926 Referring Provider: Basil Dess MD.  Encounter Date: 09/17/2015      PT End of Session - 09/17/15 1329    Visit Number 8   Number of Visits 16   Date for PT Re-Evaluation 10/17/15   PT Start Time 0104   PT Stop Time 0148   PT Time Calculation (min) 44 min   Activity Tolerance Patient tolerated treatment well   Behavior During Therapy [facility] for tasks assessed/performed      Past Medical History  Diagnosis Date  . Irregular heart rate   . Melanoma ([place])   . Prostate cancer ([place])   . Bradycardia 07/01/2014  . CAP (community acquired pneumonia) 06/29/2014  . Thrombocytopenia ([place]) 06/30/2014  . Heart murmur     Past Surgical History  Procedure Laterality Date  . Back surgery    . Knee surgery    . Cataract extraction    . Melanoma excision      There were no vitals filed for this visit.      Subjective Assessment - 09/17/15 1329    Subjective I'm getting better.   Patient Stated Goals Improve my balance and walking.   Pain Onset More than a month ago   Pain Frequency Intermittent                         [facility] Adult PT Treatment/Exercise - 09/17/15 0001    Exercises   Exercises Knee/Hip   Knee/Hip Exercises: Aerobic   Nustep Level 4 x 20 minutes.             Balance Exercises - 09/17/15 1337    Balance Exercises: Standing   Rockerboard --  8 minutes forward and sideways.   Other Standing Exercises Inverted BOSU x 5 minutes  Standing with eyes closed x 5 minutes.             PT Short Term Goals - 09/10/15 1352    PT SHORT TERM GOAL #1   Title Ind with initial HEP.   Time 2   Period Weeks   Status Achieved           PT Long Term Goals - 09/10/15 1352    PT LONG TERM GOAL #1   Title Ind with advanced HEP.   Time 8   Period Weeks   Status On-going   PT LONG TERM GOAL #2   Title Patient walk 30 minutes with pain not > 4/10.   Time 8   Period Weeks   Status On-going   PT LONG TERM GOAL #3   Title Improve Berg score to 48/56.   Time 8   Period Weeks   Status On-going             Patient will benefit from skilled therapeutic intervention in order to improve the following deficits and impairments:     Visit Diagnosis: Unsteadiness on feet  Bilateral low back pain without sciatica  Unsteadiness     Problem List Patient Active Problem List   Diagnosis Date Noted  . Spinal stenosis 09/02/2015  . Numbness 09/02/2015  . Bradycardia 07/01/2014  . Thrombocytopenia ([place]) 06/30/2014  . CAP (community acquired pneumonia) 06/29/2014  . Hypoxia 06/29/2014  . HTN (hypertension) 06/29/2014  . Prostate cancer ([place]) 06/29/2014  .  Hyponatremia 06/29/2014    Dereka Lueras, [place] MPT 09/17/2015, 1:53 PM   [facility] [address] [place], [place], [address] Phone: [phone]   Fax:  [phone]  Name: Brylon Disabatino MRN: [id number] Date of Birth: [birth year]

## 2015-09-22 ENCOUNTER — Encounter: Payer: Medicare Other | Admitting: Physical Therapy

## 2015-09-23 DIAGNOSIS — C61 Malignant neoplasm of prostate: Secondary | ICD-10-CM | POA: Diagnosis not present

## 2015-09-24 DIAGNOSIS — M5416 Radiculopathy, lumbar region: Secondary | ICD-10-CM | POA: Diagnosis not present

## 2015-09-24 DIAGNOSIS — M4806 Spinal stenosis, lumbar region: Secondary | ICD-10-CM | POA: Diagnosis not present

## 2015-09-24 DIAGNOSIS — M545 Low back pain: Secondary | ICD-10-CM | POA: Diagnosis not present

## 2015-09-24 DIAGNOSIS — M6281 Muscle weakness (generalized): Secondary | ICD-10-CM | POA: Diagnosis not present

## 2015-09-25 ENCOUNTER — Ambulatory Visit: Payer: Medicare Other | Admitting: Physical Therapy

## 2015-09-25 DIAGNOSIS — R2681 Unsteadiness on feet: Secondary | ICD-10-CM | POA: Diagnosis not present

## 2015-09-25 DIAGNOSIS — M545 Low back pain, unspecified: Secondary | ICD-10-CM

## 2015-09-25 NOTE — Therapy (Signed)
[facility] [place], [place], [address] Phone: [phone]   Fax:  [phone]  Physical Therapy Treatment  Patient Details  Name: Royden Coln MRN: [id number] Date of Birth: May 30, 1926 Referring Provider: Basil Dess MD.  Encounter Date: 09/25/2015      PT End of Session - 09/25/15 1223    Visit Number 9   Number of Visits 16   Date for PT Re-Evaluation 10/17/15   PT Start Time 1117   PT Stop Time 1157   PT Time Calculation (min) 40 min   Activity Tolerance Patient tolerated treatment well   Behavior During Therapy [facility] for tasks assessed/performed      Past Medical History  Diagnosis Date  . Irregular heart rate   . Melanoma ([place])   . Prostate cancer ([place])   . Bradycardia 07/01/2014  . CAP (community acquired pneumonia) 06/29/2014  . Thrombocytopenia ([place]) 06/30/2014  . Heart murmur     Past Surgical History  Procedure Laterality Date  . Back surgery    . Knee surgery    . Cataract extraction    . Melanoma excision      There were no vitals filed for this visit.      Subjective Assessment - 09/25/15 1240    Subjective I saw the PA yesterday.  He was well pleased and states he can tell I am much stronger.   Patient Stated Goals Improve my balance and walking.   Pain Score 2    Pain Location Back   Pain Orientation Lower   Pain Descriptors / Indicators Sore   Pain Onset More than a month ago      Treatment:  Nustep x 21 minutes f/b Rockerboard x 6 minutes (forward and sideways); step-ups 6 inches forward and sideways; XTS walking x 5 minutes.                               PT Short Term Goals - 09/10/15 1352    PT SHORT TERM GOAL #1   Title Ind with initial HEP.   Time 2   Period Weeks   Status Achieved           PT Long Term Goals - 09/10/15 1352    PT LONG TERM GOAL #1   Title Ind with advanced HEP.   Time 8   Period Weeks   Status On-going   PT LONG TERM  GOAL #2   Title Patient walk 30 minutes with pain not > 4/10.   Time 8   Period Weeks   Status On-going   PT LONG TERM GOAL #3   Title Improve Berg score to 48/56.   Time 8   Period Weeks   Status On-going             Patient will benefit from skilled therapeutic intervention in order to improve the following deficits and impairments:     Visit Diagnosis: Unsteadiness on feet  Bilateral low back pain without sciatica  Unsteadiness     Problem List Patient Active Problem List   Diagnosis Date Noted  . Spinal stenosis 09/02/2015  . Numbness 09/02/2015  . Bradycardia 07/01/2014  . Thrombocytopenia ([place]) 06/30/2014  . CAP (community acquired pneumonia) 06/29/2014  . Hypoxia 06/29/2014  . HTN (hypertension) 06/29/2014  . Prostate cancer ([place]) 06/29/2014  . Hyponatremia 06/29/2014    APPLEGATE, [place] MPT 09/25/2015, 1:03 PM  [facility] [address]  [place], [place], [address] Phone: [phone]   Fax:  [phone]  Name: Itamar Gallier MRN: [id number] Date of Birth: 1926/09/05

## 2015-09-30 DIAGNOSIS — R351 Nocturia: Secondary | ICD-10-CM | POA: Diagnosis not present

## 2015-09-30 DIAGNOSIS — N401 Enlarged prostate with lower urinary tract symptoms: Secondary | ICD-10-CM | POA: Diagnosis not present

## 2015-09-30 DIAGNOSIS — C61 Malignant neoplasm of prostate: Secondary | ICD-10-CM | POA: Diagnosis not present

## 2015-10-01 ENCOUNTER — Ambulatory Visit: Payer: Medicare Other | Attending: Specialist | Admitting: Physical Therapy

## 2015-10-01 DIAGNOSIS — M545 Low back pain, unspecified: Secondary | ICD-10-CM

## 2015-10-01 DIAGNOSIS — R2681 Unsteadiness on feet: Secondary | ICD-10-CM | POA: Insufficient documentation

## 2015-10-01 NOTE — Therapy (Signed)
[facility] [place], [place], [address] Phone: [phone]   Fax:  [phone]  Physical Therapy Treatment  Patient Details  Name: Thomas Vega MRN: [id number] Date of Birth: [birth year] Referring Provider: Basil Dess MD.  Encounter Date: Oct 16, 2015      PT End of Session - 10-16-2015 1416    Visit Number 10   Number of Visits 16   PT Start Time 0100   PT Stop Time 0150   PT Time Calculation (min) 50 min   Activity Tolerance Patient tolerated treatment well   Behavior During Therapy [facility] for tasks assessed/performed      Past Medical History  Diagnosis Date  . Irregular heart rate   . Melanoma ([place])   . Prostate cancer ([place])   . Bradycardia 07/01/2014  . CAP (community acquired pneumonia) 06/29/2014  . Thrombocytopenia ([place]) 06/30/2014  . Heart murmur     Past Surgical History  Procedure Laterality Date  . Back surgery    . Knee surgery    . Cataract extraction    . Melanoma excision      There were no vitals filed for this visit.                                 PT Short Term Goals - 09/10/15 1352    PT SHORT TERM GOAL #1   Title Ind with initial HEP.   Time 2   Period Weeks   Status Achieved           PT Long Term Goals - 09/10/15 1352    PT LONG TERM GOAL #1   Title Ind with advanced HEP.   Time 8   Period Weeks   Status On-going   PT LONG TERM GOAL #2   Title Patient walk 30 minutes with pain not > 4/10.   Time 8   Period Weeks   Status On-going   PT LONG TERM GOAL #3   Title Improve Berg score to 48/56.   Time 8   Period Weeks   Status On-going             Patient will benefit from skilled therapeutic intervention in order to improve the following deficits and impairments:     Visit Diagnosis: Unsteadiness on feet  Bilateral low back pain without sciatica  Unsteadiness       G-Codes - Oct 16, 2015 1416    Functional Assessment Tool Used  Clinical judgement...   Functional Limitation Mobility: Walking and moving around   Mobility: Walking and Moving Around Current Status [phone]) At least 20 percent but less than 40 percent impaired, limited or restricted   Mobility: Walking and Moving Around Goal Status [phone]) At least 20 percent but less than 40 percent impaired, limited or restricted      Problem List Patient Active Problem List   Diagnosis Date Noted  . Spinal stenosis 09/02/2015  . Numbness 09/02/2015  . Bradycardia 07/01/2014  . Thrombocytopenia ([place]) 06/30/2014  . CAP (community acquired pneumonia) 06/29/2014  . Hypoxia 06/29/2014  . HTN (hypertension) 06/29/2014  . Prostate cancer ([place]) 06/29/2014  . Hyponatremia 06/29/2014  Treatment:  Nustep level 5 x 20 minutes; Rockerboard x 10  Minutes forward and backward; Inverted BOSU ball x 5 minutes; Airex balance (side stepping) in parallel bars x 3 minutes; Resisted walking with orange XTS band x 4 minutes.  Excellent job today.  Manolito Jurewicz, [place] MPT 16-Oct-2015,  2:20 PM  [facility] [place], [place], [address] Phone: [phone]   Fax:  [phone]  Name: Thomas Vega MRN: [id number] Date of Birth: [birth year]

## 2015-10-06 ENCOUNTER — Ambulatory Visit: Payer: Medicare Other | Admitting: *Deleted

## 2015-10-06 DIAGNOSIS — R2681 Unsteadiness on feet: Secondary | ICD-10-CM | POA: Diagnosis not present

## 2015-10-06 DIAGNOSIS — M545 Low back pain, unspecified: Secondary | ICD-10-CM

## 2015-10-06 NOTE — Therapy (Signed)
[facility] [place], [place], [address] Phone: [phone]   Fax:  [phone]  Physical Therapy Treatment  Patient Details  Name: Thomas Vega MRN: [id number] Date of Birth: 30-Jul-1926 Referring Provider: Basil Dess MD.  Encounter Date: 10/06/2015      PT End of Session - 10/06/15 1321    Visit Number 11   Number of Visits 16   Date for PT Re-Evaluation 10/17/15   PT Start Time 1300   PT Stop Time 1351   PT Time Calculation (min) 51 min      Past Medical History  Diagnosis Date  . Irregular heart rate   . Melanoma ([place])   . Prostate cancer ([place])   . Bradycardia 07/01/2014  . CAP (community acquired pneumonia) 06/29/2014  . Thrombocytopenia ([place]) 06/30/2014  . Heart murmur     Past Surgical History  Procedure Laterality Date  . Back surgery    . Knee surgery    . Cataract extraction    . Melanoma excision      There were no vitals filed for this visit.      Subjective Assessment - 10/06/15 1320    Subjective My LB hurts a little today   Patient Stated Goals Improve my balance and walking.   Currently in Pain? Yes   Pain Score 2    Pain Location Back   Pain Orientation Lower   Pain Descriptors / Indicators Sore   Pain Type Chronic pain   Pain Onset More than a month ago   Pain Frequency Intermittent            [facility] PT Assessment - 10/06/15 0001    Berg Balance Test   Sit to Stand Able to stand without using hands and stabilize independently   Standing Unsupported Able to stand safely 2 minutes   Sitting with Back Unsupported but Feet Supported on Floor or Stool Able to sit safely and securely 2 minutes   Stand to Sit Sits safely with minimal use of hands   Transfers Able to transfer safely, minor use of hands   Standing Unsupported with Eyes Closed Able to stand 3 seconds   Standing Ubsupported with Feet Together Able to place feet together independently and stand for 1 minute with  supervision   From Standing, Reach Forward with Outstretched Arm Can reach confidently >25 cm (10")   From Standing Position, Pick up Object from Floor Able to pick up shoe safely and easily   From Standing Position, Turn to Look Behind Over each Shoulder Looks behind one side only/other side shows less weight shift   Turn 360 Degrees Able to turn 360 degrees safely in 4 seconds or less   Standing Unsupported, Alternately Place Feet on Step/Stool Able to stand independently and safely and complete 8 steps in 20 seconds   Standing Unsupported, One Foot in Front Able to take small step independently and hold 30 seconds   Standing on One Leg Unable to try or needs assist to prevent fall   Total Score 46                     [facility] Adult PT Treatment/Exercise - 10/06/15 0001    Exercises   Exercises Knee/Hip   Knee/Hip Exercises: Aerobic   Nustep Level 4 x 20 minutes.   Knee/Hip Exercises: Standing   Rocker Board 5 minutes  balance and calf stretching, also x 5 mins side to side  PT Short Term Goals - 09/10/15 1352    PT SHORT TERM GOAL #1   Title Ind with initial HEP.   Time 2   Period Weeks   Status Achieved           PT Long Term Goals - 10/06/15 1412    PT LONG TERM GOAL #2   Title Patient walk 30 minutes with pain not > 4/10.   Time 8   Period Weeks   Status Achieved               Plan - 10/06/15 1321    Clinical Impression Statement Pt did great today with Rx and was able to progress Berg score to 46 today. She was unable to meet LTG yet (48/56) due to mild balance deficit still and is ongoing   PT Frequency 2x / week   PT Duration 8 weeks   PT Treatment/Interventions ADLs/Self Care Home Management;Neuromuscular re-education;Therapeutic exercise;Gait training;Patient/family education   PT Next Visit Plan Bilateral LE strengthening; Gait and balance training.                  Berg Test next Rx to check goal   Consulted and  Agree with Plan of Care Patient      Patient will benefit from skilled therapeutic intervention in order to improve the following deficits and impairments:  Pain, Decreased activity tolerance, Decreased strength, Decreased coordination  Visit Diagnosis: Unsteadiness on feet  Bilateral low back pain without sciatica     Problem List Patient Active Problem List   Diagnosis Date Noted  . Spinal stenosis 09/02/2015  . Numbness 09/02/2015  . Bradycardia 07/01/2014  . Thrombocytopenia ([place]) 06/30/2014  . CAP (community acquired pneumonia) 06/29/2014  . Hypoxia 06/29/2014  . HTN (hypertension) 06/29/2014  . Prostate cancer ([place]) 06/29/2014  . Hyponatremia 06/29/2014    RAMSEUR,CHRIS, PTA 10/06/2015, 2:40 PM  [facility] [place], [place], [address] Phone: [phone]   Fax:  [phone]  Name: Thomas Vega MRN: [id number] Date of Birth: [birth year]

## 2015-10-08 ENCOUNTER — Ambulatory Visit: Payer: Medicare Other | Admitting: *Deleted

## 2015-10-08 DIAGNOSIS — M545 Low back pain, unspecified: Secondary | ICD-10-CM

## 2015-10-08 DIAGNOSIS — R2681 Unsteadiness on feet: Secondary | ICD-10-CM

## 2015-10-08 NOTE — Therapy (Signed)
[facility] [place], [place], [address] Phone: [phone]   Fax:  [phone]  Physical Therapy Treatment  Patient Details  Name: Thomas Vega MRN: [id number] Date of Birth: [birth year] Referring Provider: Basil Dess MD.  Encounter Date: 10/08/2015      PT End of Session - 10/08/15 1318    Visit Number 12   Number of Visits 16   Date for PT Re-Evaluation 10/17/15   PT Start Time 1300   PT Stop Time 1354   PT Time Calculation (min) 54 min      Past Medical History  Diagnosis Date  . Irregular heart rate   . Melanoma ([place])   . Prostate cancer ([place])   . Bradycardia 07/01/2014  . CAP (community acquired pneumonia) 06/29/2014  . Thrombocytopenia ([place]) 06/30/2014  . Heart murmur     Past Surgical History  Procedure Laterality Date  . Back surgery    . Knee surgery    . Cataract extraction    . Melanoma excision      There were no vitals filed for this visit.      Subjective Assessment - 10/08/15 1314    Subjective My LB hurts a little today and am tired   Patient Stated Goals Improve my balance and walking.   Currently in Pain? Yes   Pain Score 2    Pain Location Back   Pain Orientation Lower   Pain Descriptors / Indicators Sore   Pain Type Chronic pain   Pain Onset More than a month ago   Pain Frequency Intermittent                         [facility] Adult PT Treatment/Exercise - 10/08/15 0001    Exercises   Exercises Knee/Hip   Knee/Hip Exercises: Aerobic   Nustep Level 4 x 20 minutes.   Knee/Hip Exercises: Standing   Rocker Board 5 minutes  balance and calf stretching, also x 5 mins side to side   Other Standing Knee Exercises toe taps on 6 inch step 3 x20 forward,  Both with manual CGA             Balance Exercises - 10/08/15 1322    Balance Exercises: Standing   Standing Eyes Closed Wide (BOA);Foam/compliant surface;Solid surface  x4 min   Other Standing Exercises 4 way  Pink XTS walking x10 reps each                                                      Standing with eyes closed on airex pad and then standing on floor.         PT Short Term Goals - 09/10/15 1352    PT SHORT TERM GOAL #1   Title Ind with initial HEP.   Time 2   Period Weeks   Status Achieved           PT Long Term Goals - 10/06/15 1412    PT LONG TERM GOAL #2   Title Patient walk 30 minutes with pain not > 4/10.   Time 8   Period Weeks   Status Achieved               Plan - 10/08/15 1318    Clinical Impression Statement Pt did fairly well  with Rx today and had a good day with balance and propriception Act.'s. He was able to self - correct today with less UE assist with Eyes open and closed. Balance goal is on-going   PT Frequency 2x / week   PT Duration 8 weeks   PT Next Visit Plan Bilateral LE strengthening; Gait and balance training.                    Consulted and Agree with Plan of Care Patient      Patient will benefit from skilled therapeutic intervention in order to improve the following deficits and impairments:  Pain, Decreased activity tolerance, Decreased strength, Decreased coordination  Visit Diagnosis: Unsteadiness on feet  Bilateral low back pain without sciatica     Problem List Patient Active Problem List   Diagnosis Date Noted  . Spinal stenosis 09/02/2015  . Numbness 09/02/2015  . Bradycardia 07/01/2014  . Thrombocytopenia ([place]) 06/30/2014  . CAP (community acquired pneumonia) 06/29/2014  . Hypoxia 06/29/2014  . HTN (hypertension) 06/29/2014  . Prostate cancer ([place]) 06/29/2014  . Hyponatremia 06/29/2014    Thomas Vega,CHRIS, PTA 10/08/2015, 2:02 PM  [facility] [place], [place], [address] Phone: [phone]   Fax:  [phone]  Name: Thomas Vega MRN: [id number] Date of Birth: 1926-07-30

## 2015-10-13 ENCOUNTER — Ambulatory Visit: Payer: Medicare Other | Admitting: *Deleted

## 2015-10-13 DIAGNOSIS — R2681 Unsteadiness on feet: Secondary | ICD-10-CM

## 2015-10-13 DIAGNOSIS — M545 Low back pain, unspecified: Secondary | ICD-10-CM

## 2015-10-13 NOTE — Therapy (Signed)
[facility] [place], [place], [address] Phone: [phone]   Fax:  [phone]  Physical Therapy Treatment  Patient Details  Name: Thomas Vega MRN: [id number] Date of Birth: 1926/11/08 Referring Provider: Basil Dess MD.  Encounter Date: 10/13/2015      PT End of Session - 10/13/15 1334    Visit Number 13   Number of Visits 16   Date for PT Re-Evaluation 10/17/15   PT Start Time 1300   PT Stop Time [id number]   PT Time Calculation (min) 55 min      Past Medical History  Diagnosis Date  . Irregular heart rate   . Melanoma ([place])   . Prostate cancer ([place])   . Bradycardia 07/01/2014  . CAP (community acquired pneumonia) 06/29/2014  . Thrombocytopenia ([place]) 06/30/2014  . Heart murmur     Past Surgical History  Procedure Laterality Date  . Back surgery    . Knee surgery    . Cataract extraction    . Melanoma excision      There were no vitals filed for this visit.      Subjective Assessment - 10/13/15 1332    Subjective My LB hurts a little today and am tired. LB hurting doing dishes   Patient Stated Goals Improve my balance and walking.   Currently in Pain? Yes   Pain Score 2    Pain Location Back   Pain Orientation Lower   Pain Descriptors / Indicators Sore   Pain Type Chronic pain   Pain Onset More than a month ago   Aggravating Factors  ADL,s   Pain Relieving Factors rest                         [facility] Adult PT Treatment/Exercise - 10/13/15 0001    Exercises   Exercises Knee/Hip   Knee/Hip Exercises: Aerobic   Nustep Level 4 x 20 minutes.   Knee/Hip Exercises: Standing   Rocker Board 5 minutes  balance and calf stretching, also x 5 mins side to side   Other Standing Knee Exercises toe taps on 6 inch step 3 x20 forward,  Both with manual CGA   Other Standing Knee Exercises Side stepping on Airex balance beam             Balance Exercises - 10/13/15 1354    Balance Exercises:  Standing   Standing Eyes Closed Wide (BOA);Foam/compliant surface;Solid surface  x47min   Other Standing Exercises 4 way Pink XTS walking x10 reps each             PT Short Term Goals - 09/10/15 1352    PT SHORT TERM GOAL #1   Title Ind with initial HEP.   Time 2   Period Weeks   Status Achieved           PT Long Term Goals - 10/06/15 1412    PT LONG TERM GOAL #2   Title Patient walk 30 minutes with pain not > 4/10.   Time 8   Period Weeks   Status Achieved               Plan - 10/13/15 1335    Clinical Impression Statement Pt did well with Rx today and was able to complete all exs and Act.'s without increasing LBP. We reviewed body mechanics for counter and sink activities. Goals ongoing   PT Frequency 2x / week   PT Duration 8 weeks  PT Treatment/Interventions ADLs/Self Care Home Management;Neuromuscular re-education;Therapeutic exercise;Gait training;Patient/family education   PT Next Visit Plan Bilateral LE strengthening; Gait and balance training.                    Consulted and Agree with Plan of Care Patient      Patient will benefit from skilled therapeutic intervention in order to improve the following deficits and impairments:  Pain, Decreased activity tolerance, Decreased strength, Decreased coordination  Visit Diagnosis: Bilateral low back pain without sciatica  Unsteadiness on feet     Problem List Patient Active Problem List   Diagnosis Date Noted  . Spinal stenosis 09/02/2015  . Numbness 09/02/2015  . Bradycardia 07/01/2014  . Thrombocytopenia ([place]) 06/30/2014  . CAP (community acquired pneumonia) 06/29/2014  . Hypoxia 06/29/2014  . HTN (hypertension) 06/29/2014  . Prostate cancer ([place]) 06/29/2014  . Hyponatremia 06/29/2014    Inari Shin,CHRIS, PTA 10/13/2015, 2:27 PM  [facility] [place], [place], [address] Phone: [phone]   Fax:  [phone]  Name: Thomas Vega MRN: [id number] Date of Birth: March 13, 1927

## 2015-10-15 ENCOUNTER — Ambulatory Visit: Payer: Medicare Other | Admitting: *Deleted

## 2015-10-15 DIAGNOSIS — M545 Low back pain, unspecified: Secondary | ICD-10-CM

## 2015-10-15 DIAGNOSIS — R2681 Unsteadiness on feet: Secondary | ICD-10-CM | POA: Diagnosis not present

## 2015-10-15 NOTE — Therapy (Signed)
[facility] [place], [place], [address] Phone: [phone]   Fax:  [phone]  Physical Therapy Treatment  Patient Details  Name: Thomas Vega MRN: [id number] Date of Birth: 17-Feb-1927 Referring Provider: Basil Dess MD.  Encounter Date: 10/15/2015      PT End of Session - 10/15/15 1305    Visit Number 14   Number of Visits 16   Date for PT Re-Evaluation 10/17/15   PT Start Time 1300   PT Stop Time 1351   PT Time Calculation (min) 51 min      Past Medical History  Diagnosis Date  . Irregular heart rate   . Melanoma ([place])   . Prostate cancer ([place])   . Bradycardia 07/01/2014  . CAP (community acquired pneumonia) 06/29/2014  . Thrombocytopenia ([place]) 06/30/2014  . Heart murmur     Past Surgical History  Procedure Laterality Date  . Back surgery    . Knee surgery    . Cataract extraction    . Melanoma excision      There were no vitals filed for this visit.      Subjective Assessment - 10/15/15 1307    Subjective My LB hurts a little today and am tired. LB hurting 2/10   Patient Stated Goals Improve my balance and walking.   Currently in Pain? Yes   Pain Score 2    Pain Location Back   Pain Orientation Lower   Pain Descriptors / Indicators Sore   Pain Type Chronic pain   Pain Onset More than a month ago   Pain Frequency Intermittent   Aggravating Factors  ADLs                         [facility] Adult PT Treatment/Exercise - 10/15/15 0001    Exercises   Exercises Knee/Hip   Knee/Hip Exercises: Aerobic   Nustep Level 4 x 20 minutes.   Knee/Hip Exercises: Standing   Rocker Board 5 minutes  balance and calf stretching, also x 5 mins side to side   Other Standing Knee Exercises toe taps on 6 inch step 3 x20 forward,  Both with manual CGA   Other Standing Knee Exercises Side stepping on Airex balance beam             Balance Exercises - 10/15/15 1331    Balance Exercises: Standing    Standing Eyes Closed Wide (BOA);Foam/compliant surface;Solid surface  x62min   Other Standing Exercises 4 way Pink XTS walking x10 reps each             PT Short Term Goals - 09/10/15 1352    PT SHORT TERM GOAL #1   Title Ind with initial HEP.   Time 2   Period Weeks   Status Achieved           PT Long Term Goals - 10/06/15 1412    PT LONG TERM GOAL #2   Title Patient walk 30 minutes with pain not > 4/10.   Time 8   Period Weeks   Status Achieved               Plan - 10/15/15 1309    Clinical Impression Statement Pt did fairly well again today with Therex and Act.'s and was able to complete them with no increase in LBP. He is getting closer to meeting LTG for balance, but is ongoing at this time.   Rehab Potential Good   PT Frequency  2x / week   PT Duration 8 weeks   PT Treatment/Interventions ADLs/Self Care Home Management;Neuromuscular re-education;Therapeutic exercise;Gait training;Patient/family education   PT Next Visit Plan Bilateral LE strengthening; Gait and balance training.                    PT Home Exercise Plan issue HEP   Consulted and Agree with Plan of Care Patient      Patient will benefit from skilled therapeutic intervention in order to improve the following deficits and impairments:  Pain, Decreased activity tolerance, Decreased strength, Decreased coordination  Visit Diagnosis: Bilateral low back pain without sciatica  Unsteadiness on feet     Problem List Patient Active Problem List   Diagnosis Date Noted  . Spinal stenosis 09/02/2015  . Numbness 09/02/2015  . Bradycardia 07/01/2014  . Thrombocytopenia ([place]) 06/30/2014  . CAP (community acquired pneumonia) 06/29/2014  . Hypoxia 06/29/2014  . HTN (hypertension) 06/29/2014  . Prostate cancer ([place]) 06/29/2014  . Hyponatremia 06/29/2014    RAMSEUR,CHRIS, PTA 10/15/2015, 6:17 PM  [facility] [place], [place],  [address] Phone: [phone]   Fax:  [phone]  Name: Thomas Vega MRN: [id number] Date of Birth: 1926-05-04

## 2015-10-20 ENCOUNTER — Encounter: Payer: Medicare Other | Admitting: *Deleted

## 2015-10-22 ENCOUNTER — Encounter: Payer: Self-pay | Admitting: Physical Therapy

## 2015-10-22 ENCOUNTER — Ambulatory Visit: Payer: Medicare Other | Admitting: Physical Therapy

## 2015-10-22 DIAGNOSIS — R2681 Unsteadiness on feet: Secondary | ICD-10-CM

## 2015-10-22 DIAGNOSIS — M545 Low back pain: Secondary | ICD-10-CM | POA: Diagnosis not present

## 2015-10-22 NOTE — Patient Instructions (Signed)
  Toe-Up (Ankle Plantar Flexion and Dorsiflexion)   Holding a stable object, rise up on toes. Hold ____ seconds. Then rock back on heels and Hold 2____ seconds. Repeat _5-10___ times. Do _1-2___ sessions per day.  http://gt2.exer.us/409   Copyright  VHI. All rights reserved.  High Stepping   Using support, lift knees, taking high steps. Repeat __5-10__ times. Do __1-2__ sessions per day.  http://gt2.exer.us/533   Copyright  VHI. All rights reserved.  Walk to the Side   Step to the side with stronger leg and follow with involved leg. Then return. Hold chair if necessary. Repeat __5-10__ times. Do _1-2___ sessions per day.  http://gt2.exer.us/721   Copyright  VHI. All rights reserved.  Pelvic Tilt: Posterior - Legs Bent (Supine)  Bridging   Slowly raise buttocks from floor, keeping stomach tight. Repeat _10___ times per set. Do __2__ sets per session. Do __2__ sessions per day.        Half Squat to Chair   Stand with feet shoulder width apart. Push buttocks backward and lower slowly, sitting in chair lightly and returning to standing position. Complete _2_ sets of 10_ repetitions. Perform __2-3_ sessions per day.

## 2015-10-22 NOTE — Therapy (Signed)
[facility] [place], [place], [address] Phone: [phone]   Fax:  [phone]  Physical Therapy Treatment  Patient Details  Name: Thomas Vega MRN: [id number] Date of Birth: October 22, 1926 Referring Provider: Basil Dess MD.  Encounter Date: 10/22/2015      PT End of Session - 10/22/15 1255    Visit Number 15   Number of Visits 16   Date for PT Re-Evaluation 10/17/15   PT Start Time 1229   PT Stop Time 1313   PT Time Calculation (min) 44 min   Activity Tolerance Patient tolerated treatment well   Behavior During Therapy [facility] for tasks assessed/performed      Past Medical History:  Diagnosis Date  . Bradycardia 07/01/2014  . CAP (community acquired pneumonia) 06/29/2014  . Heart murmur   . Irregular heart rate   . Melanoma ([place])   . Prostate cancer ([place])   . Thrombocytopenia ([place]) 06/30/2014    Past Surgical History:  Procedure Laterality Date  . BACK SURGERY    . CATARACT EXTRACTION    . KNEE SURGERY    . MELANOMA EXCISION      There were no vitals filed for this visit.      Subjective Assessment - 10/22/15 1234    Subjective Patient feeling good today with intermitten pain with activity   Patient Stated Goals Improve my balance and walking.   Currently in Pain? Yes   Pain Score 2    Pain Location Back   Pain Orientation Lower   Pain Descriptors / Indicators Sore   Pain Type Chronic pain   Pain Onset More than a month ago   Pain Frequency Intermittent   Aggravating Factors  ADL's    Pain Relieving Factors rest            [facility] PT Assessment - 10/22/15 0001      Berg Balance Test   Sit to Stand Able to stand without using hands and stabilize independently   Standing Unsupported Able to stand safely 2 minutes   Sitting with Back Unsupported but Feet Supported on Floor or Stool Able to sit safely and securely 2 minutes   Stand to Sit Sits safely with minimal use of hands   Transfers Able to  transfer safely, minor use of hands   Standing Unsupported with Eyes Closed Able to stand 3 seconds   Standing Ubsupported with Feet Together Able to place feet together independently and stand 1 minute safely   From Standing, Reach Forward with Outstretched Arm Can reach confidently >25 cm (10")   From Standing Position, Pick up Object from Floor Able to pick up shoe safely and easily   From Standing Position, Turn to Look Behind Over each Shoulder Looks behind one side only/other side shows less weight shift   Turn 360 Degrees Able to turn 360 degrees safely in 4 seconds or less   Standing Unsupported, Alternately Place Feet on Step/Stool Able to stand independently and safely and complete 8 steps in 20 seconds   Standing Unsupported, One Foot in Front Able to take small step independently and hold 30 seconds   Standing on One Leg Unable to try or needs assist to prevent fall   Total Score 47                     [facility] Adult PT Treatment/Exercise - 10/22/15 0001      Knee/Hip Exercises: Aerobic   Nustep Level 4 x 20 minutes.  Knee/Hip Exercises: Standing   Rocker Board 4 minutes   Other Standing Knee Exercises toe taps on 8 inch step 2 x10 forward,   Other Standing Knee Exercises --                PT Education - 10/22/15 1311    Education provided Yes   Education Details HEP   Person(s) Educated Patient   Methods Explanation;Demonstration;Handout   Comprehension Verbalized understanding;Returned demonstration          PT Short Term Goals - 09/10/15 1352      PT SHORT TERM GOAL #1   Title Ind with initial HEP.   Time 2   Period Weeks   Status Achieved           PT Long Term Goals - 10/22/15 1304      PT LONG TERM GOAL #1   Title Ind with advanced HEP.   Time 8   Period Weeks   Status Achieved  10/22/15     PT LONG TERM GOAL #2   Title Patient walk 30 minutes with pain not > 4/10.   Time 8   Period Weeks   Status Achieved     PT  LONG TERM GOAL #3   Title Improve Berg score to 48/56.   Time 8   Period Weeks   Status Not Met  47/56 10/22/15               Plan - 10/22/15 1306    Clinical Impression Statement Patient progressing and increased BERG score by 1 point today to 47/56. Patient reported no falls. Patient not doing his exercises daily, educated patient on exercise daily to improve balance, strength and functional independence. All goals met except BERG score.   Rehab Potential Good   PT Frequency 2x / week   PT Duration 8 weeks   PT Treatment/Interventions ADLs/Self Care Home Management;Neuromuscular re-education;Therapeutic exercise;Gait training;Patient/family education   PT Next Visit Plan DC   Consulted and Agree with Plan of Care Patient      Patient will benefit from skilled therapeutic intervention in order to improve the following deficits and impairments:  Pain, Decreased activity tolerance, Decreased strength, Decreased coordination  Visit Diagnosis: Unsteadiness on feet     Problem List Patient Active Problem List   Diagnosis Date Noted  . Spinal stenosis 09/02/2015  . Numbness 09/02/2015  . Bradycardia 07/01/2014  . Thrombocytopenia ([place]) 06/30/2014  . CAP (community acquired pneumonia) 06/29/2014  . Hypoxia 06/29/2014  . HTN (hypertension) 06/29/2014  . Prostate cancer ([place]) 06/29/2014  . Hyponatremia 06/29/2014    Janette Harvie P 10/22/2015, 1:15 PM   Ladean Raya, PTA 10/22/15 1:15 PM  [facility] [address] [place], [place], [address] Phone: [phone]   Fax:  [phone]  Name: Kriss Ishler MRN: [id number] Date of Birth: [birth year]

## 2015-10-22 NOTE — Therapy (Signed)
[facility] [place], [place], [address] Phone: [phone]   Fax:  [phone]  Physical Therapy Treatment  Patient Details  Name: Thomas Vega MRN: [id number] Date of Birth: October 22, 1926 Referring Provider: Basil Dess MD.  Encounter Date: 10/22/2015      PT End of Session - 10/22/15 1255    Visit Number 15   Number of Visits 16   Date for PT Re-Evaluation 10/17/15   PT Start Time 1229   PT Stop Time 1313   PT Time Calculation (min) 44 min   Activity Tolerance Patient tolerated treatment well   Behavior During Therapy [facility] for tasks assessed/performed      Past Medical History:  Diagnosis Date  . Bradycardia 07/01/2014  . CAP (community acquired pneumonia) 06/29/2014  . Heart murmur   . Irregular heart rate   . Melanoma ([place])   . Prostate cancer ([place])   . Thrombocytopenia ([place]) 06/30/2014    Past Surgical History:  Procedure Laterality Date  . BACK SURGERY    . CATARACT EXTRACTION    . KNEE SURGERY    . MELANOMA EXCISION      There were no vitals filed for this visit.      Subjective Assessment - 10/22/15 1234    Subjective Patient feeling good today with intermitten pain with activity   Patient Stated Goals Improve my balance and walking.   Currently in Pain? Yes   Pain Score 2    Pain Location Back   Pain Orientation Lower   Pain Descriptors / Indicators Sore   Pain Type Chronic pain   Pain Onset More than a month ago   Pain Frequency Intermittent   Aggravating Factors  ADL's    Pain Relieving Factors rest            [facility] PT Assessment - 10/22/15 0001      Berg Balance Test   Sit to Stand Able to stand without using hands and stabilize independently   Standing Unsupported Able to stand safely 2 minutes   Sitting with Back Unsupported but Feet Supported on Floor or Stool Able to sit safely and securely 2 minutes   Stand to Sit Sits safely with minimal use of hands   Transfers Able to  transfer safely, minor use of hands   Standing Unsupported with Eyes Closed Able to stand 3 seconds   Standing Ubsupported with Feet Together Able to place feet together independently and stand 1 minute safely   From Standing, Reach Forward with Outstretched Arm Can reach confidently >25 cm (10")   From Standing Position, Pick up Object from Floor Able to pick up shoe safely and easily   From Standing Position, Turn to Look Behind Over each Shoulder Looks behind one side only/other side shows less weight shift   Turn 360 Degrees Able to turn 360 degrees safely in 4 seconds or less   Standing Unsupported, Alternately Place Feet on Step/Stool Able to stand independently and safely and complete 8 steps in 20 seconds   Standing Unsupported, One Foot in Front Able to take small step independently and hold 30 seconds   Standing on One Leg Unable to try or needs assist to prevent fall   Total Score 47                     [facility] Adult PT Treatment/Exercise - 10/22/15 0001      Knee/Hip Exercises: Aerobic   Nustep Level 4 x 20 minutes.  Knee/Hip Exercises: Standing   Rocker Board 4 minutes   Other Standing Knee Exercises toe taps on 8 inch step 2 x10 forward,   Other Standing Knee Exercises --                PT Education - 10-30-2015 1311    Education provided Yes   Education Details HEP   Person(s) Educated Patient   Methods Explanation;Demonstration;Handout   Comprehension Verbalized understanding;Returned demonstration          PT Short Term Goals - 09/10/15 1352      PT SHORT TERM GOAL #1   Title Ind with initial HEP.   Time 2   Period Weeks   Status Achieved           PT Long Term Goals - 10/30/2015 1304      PT LONG TERM GOAL #1   Title Ind with advanced HEP.   Time 8   Period Weeks   Status Achieved  10-30-15     PT LONG TERM GOAL #2   Title Patient walk 30 minutes with pain not > 4/10.   Time 8   Period Weeks   Status Achieved     PT  LONG TERM GOAL #3   Title Improve Berg score to 48/56.   Time 8   Period Weeks   Status Not Met  47/56 10-30-2015               Plan - 10/30/15 1306    Clinical Impression Statement Patient progressing and increased BERG score by 1 point today to 47/56. Patient reported no falls. Patient not doing his exercises daily, educated patient on exercise daily to improve balance, strength and functional independence. All goals met except BERG score.   Rehab Potential Good   PT Frequency 2x / week   PT Duration 8 weeks   PT Treatment/Interventions ADLs/Self Care Home Management;Neuromuscular re-education;Therapeutic exercise;Gait training;Patient/family education   PT Next Visit Plan DC   Consulted and Agree with Plan of Care Patient      Patient will benefit from skilled therapeutic intervention in order to improve the following deficits and impairments:  Pain, Decreased activity tolerance, Decreased strength, Decreased coordination  Visit Diagnosis: Unsteadiness on feet       G-Codes - 2015/10/30 1804    Functional Assessment Tool Used Clinical judgement...d/c.   Functional Limitation Mobility: Walking and moving around   Mobility: Walking and Moving Around Current Status [phone]) At least 20 percent but less than 40 percent impaired, limited or restricted   Mobility: Walking and Moving Around Goal Status [phone]) At least 20 percent but less than 40 percent impaired, limited or restricted   Mobility: Walking and Moving Around Discharge Status [phone]) At least 20 percent but less than 40 percent impaired, limited or restricted      Problem List Patient Active Problem List   Diagnosis Date Noted  . Spinal stenosis 09/02/2015  . Numbness 09/02/2015  . Bradycardia 07/01/2014  . Thrombocytopenia ([place]) 06/30/2014  . CAP (community acquired pneumonia) 06/29/2014  . Hypoxia 06/29/2014  . HTN (hypertension) 06/29/2014  . Prostate cancer ([place]) 06/29/2014  . Hyponatremia 06/29/2014    PHYSICAL THERAPY DISCHARGE SUMMARY  Visits from Start of Care: 15  Current functional level related to goals / functional outcomes: Please see above.   Remaining deficits: One point away from meeting Merrilee Jansky goal.   Education / Equipment: HEP. Plan: Patient agrees to discharge.  Patient goals were partially met. Patient is being discharged  due to being pleased with the current functional level.  ?????      Thomas Vega, [place] MPT 10/22/2015, 6:04 PM  [facility] [address] [place], [place], [address] Phone: [phone]   Fax:  [phone]  Name: Thomas Vega MRN: [id number] Date of Birth: [birth year]

## 2015-11-24 ENCOUNTER — Encounter: Payer: Self-pay | Admitting: Neurology

## 2015-11-24 ENCOUNTER — Ambulatory Visit (INDEPENDENT_AMBULATORY_CARE_PROVIDER_SITE_OTHER): Payer: Medicare Other | Admitting: Neurology

## 2015-11-24 VITALS — BP 112/54 | HR 66 | Resp 16 | Ht 75.0 in | Wt 199.0 lb

## 2015-11-24 DIAGNOSIS — R29898 Other symptoms and signs involving the musculoskeletal system: Secondary | ICD-10-CM | POA: Diagnosis not present

## 2015-11-24 DIAGNOSIS — R29818 Other symptoms and signs involving the nervous system: Secondary | ICD-10-CM

## 2015-11-24 DIAGNOSIS — G609 Hereditary and idiopathic neuropathy, unspecified: Secondary | ICD-10-CM

## 2015-11-24 DIAGNOSIS — M4806 Spinal stenosis, lumbar region: Secondary | ICD-10-CM

## 2015-11-24 DIAGNOSIS — R2689 Other abnormalities of gait and mobility: Secondary | ICD-10-CM

## 2015-11-24 DIAGNOSIS — M48061 Spinal stenosis, lumbar region without neurogenic claudication: Secondary | ICD-10-CM

## 2015-11-24 NOTE — Progress Notes (Signed)
Subjective:    Patient ID: Thomas Vega is a 82 y.o. male.  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 Jeffie Pollock and before then by Dr. Terance Hart, since 2010 or 2011), s/p cataract removal b/l in 2015, hypertension, arthritis, status post knee replacement surgery 2004, melanoma, s/p removal from back and and left shoulder, spinal stenosis, pneumonia in April 2016 with hospitalization from 06/28/2014 through 07/01/2014, chronic low back pain, status post epidural steroid injections, status post back surgery, who presents for follow-up consultation of his balance problems and fine motor dyscontrol. The patient is accompanied by his [place] again today. I first met him on 07/23/2015 at the request of Dr. Louanne Skye, at which time the patient reported progressive weakness and balance problems for at least 3 years. I suggested further workup with labs and EMG and nerve conduction testing of his lower extremities.  Labs from 07/23/2015 including A1c, RPR, TSH, and multiple myeloma panel were unremarkable and we call patient with the test results. He had an EMG and nerve conduction test on 09/02/2015 and I reviewed the report: This EMG/EMG of the showed the following: 1.    Proximal and distal leg muscle tested showed moderate to severe chronic elevation consistent with multiple radiculopathies as might be seen with multilevel spinal stenosis.   2.    NCV findings and the length dependent severity of neuropathic changes is consistent with a superimposed axonal sensory and motor polyneuropathy.    The hand was not involved in the neuropathy and thus the severity is more likely to be mild to moderate.    3.    Mild left median neuropathy at the wrist.  We called him with his test results.  He was referred to physical therapy by Dr. Louanne Skye.   Today, 11/24/2015: He reports feeling  essentially unchanged, he still feels unbalanced, he has not had any recent falls thankfully. PT helped at the time, but not sustained. He has had no new symptoms, no TIA symptoms, no strokelike symptoms, no headaches, does not always hydrate well enough he admits. He has been using a single-point cane.  Previously:   07/23/2015: He has had progressive weakness and balance problems and fine motor dyscontrol. Symptoms have been ongoing for at least 3 years. He notices lower body weakness, upper body is fairly good with strength but he is not as good with his fine motor skills in his hands. He feels off-balance. He was previously told that he was prediabetic but never been told he has been diabetic. His cancer of prostate is being followed by Dr. Jeffie Pollock at this time. He has not fallen. He feels like he could fall.    He has a known history of multilevel lumbar spinal stenosis and neurogenic claudication, has undergone multiple epidural steroid injections with temporary relief, has had multiple MRI lumbar spine, in September 2008, August 2013, and most recently on 07/02/2015 which I reviewed:   IMPRESSION: 1. At L2-3 and L3-4 there are mild broad-based disc bulges with severe bilateral facet arthropathy and severe spinal stenosis. 2. At L4-5 there is a mild broad-based disc bulge with severe bilateral facet arthropathy and severe spinal stenosis.   He has undergone physical therapy and has been walking with a cane. He has been re-referred to physical therapy. Physical therapy he has gone to [facility]. He believes PT has helped. I reviewed your office note from 06/04/2015.  He had blood work with Dr. Jani Gravel, his primary care physician recently on 06/03/2015 which I reviewed. CBC with differential was unremarkable, CMP unremarkable, lipid panel showed a total cholesterol of 154, triglycerides 66, LDL 74, PSA elevated at 19.5. Urinalysis negative. He lives with his fiance. His first wife passed away. He had  5 children with his first wife, one son sadly passed away from melanoma. Another son also has melanoma. He is a nonsmoker. He does not drink alcohol currently and quit in 2014. He drinks about 2 cups of coffee per day, 2 glasses of tea per day, usually herbal tea, and 3 glasses of water at the most per day.   His Past Medical History Is Significant For: Past Medical History:  Diagnosis Date  . Abnormal finding of blood chemistry   . Bradycardia 07/01/2014  . CAP (community acquired pneumonia) 06/29/2014  . Heart murmur   . Irregular heart rate   . Melanoma ([place])   . Prostate cancer ([place])   . Thrombocytopenia ([place]) 06/30/2014    His Past Surgical History Is Significant For: Past Surgical History:  Procedure Laterality Date  . BACK SURGERY    . CATARACT EXTRACTION    . KNEE SURGERY    . MELANOMA EXCISION      His Family History Is Significant For: Family History  Problem Relation Age of Onset  . Heart Problems Mother   . Stroke Father     His Social History Is Significant For: Social History   Social History  . Marital status: Significant Other    Spouse name: N/A  . Number of children: N/A  . Years of education: DDS   Occupational History  . Retired     Social History Main Topics  . Smoking status: Never Smoker  . Smokeless tobacco: None  . Alcohol use No  . Drug use: No  . Sexual activity: Not Asked   Other Topics Concern  . None   Social History Narrative   Drinks 2 cups of coffee a day     His Allergies Are:  No Known Allergies:   His Current Medications Are:  Outpatient Encounter Prescriptions as of 11/24/2015  Medication Sig  . aspirin EC 81 MG tablet Take 81 mg by mouth daily.  Marland Kitchen DOXAZOSIN MESYLATE PO Take by mouth.  Marland Kitchen guaiFENesin-dextromethorphan (ROBITUSSIN DM) 100-10 MG/5ML syrup Take 5 mLs by mouth every 4 (four) hours as needed for cough.  Marland Kitchen lisinopril (PRINIVIL,ZESTRIL) 5 MG tablet Take 2.5 mg by mouth daily.  . Multiple Vitamin (MULTIVITAMIN  WITH MINERALS) TABS tablet Take 1 tablet by mouth daily.  Marland Kitchen OVER THE COUNTER MEDICATION Take 1 tablet by mouth daily as needed (antihistamine. Allergies.).   Marland Kitchen sodium chloride (OCEAN) 0.65 % nasal spray Place 2 sprays into the nose 2 (two) times daily as needed for congestion.   No facility-administered encounter medications on file as of 11/24/2015.   :  Review of Systems:  Out of a complete 14 point review of systems, all are reviewed and negative with the exception of these symptoms as listed below: Review of Systems  Neurological:       Patient states that he is doing the same as last visit. He has been through a course of PT. Still feels unbalanced, especially at night. No falls since last visit.     Objective:  Neurologic Exam  Physical Exam Physical Examination:   Vitals:   11/24/15 1400  BP: (!) 112/54  Pulse: 66  Resp: 16  General Examination: The patient is a very pleasant 82 y.o. male in no acute distress. He appears well-developed and well-nourished and very well groomed. He is in good spirits today.  HEENT: Normocephalic, atraumatic, pupils are equal, round and reactive to light and accommodation. He is status post bilateral cataract repairs. Extraocular tracking is good without limitation to gaze excursion or nystagmus noted. Normal smooth pursuit is noted. Hearing is mildly impaired. Face is symmetric with normal facial animation and normal facial sensation. Speech is clear with no dysarthria noted. There is no hypophonia. There is no lip, neck/head, jaw or voice tremor. Neck is supple with full range of passive and active motion. There are no carotid bruits on auscultation. Oropharynx exam reveals: mild mouth dryness, adequate dental hygiene and mild airway crowding, due smaller airway entry and redundant soft palate. Mallampati is class II. Tongue protrudes centrally and palate elevates symmetrically.   Chest: Clear to auscultation without wheezing, rhonchi or  crackles noted.  Heart: S1+S2+0, regular and normal without murmurs, rubs or gallops noted.   Abdomen: Soft, non-tender and non-distended with normal bowel sounds appreciated on auscultation.  Extremities: There is trace pitting edema in the distal lower extremities bilaterally. Pedal pulses are intact.  Skin: Warm and dry without trophic changes noted. There are no varicose veins.  Musculoskeletal: exam reveals no obvious joint deformities, tenderness or joint swelling or erythema.   Neurologically:  Mental status: The patient is awake, alert and oriented in all 4 spheres. His immediate and remote memory, attention, language skills and fund of knowledge are appropriate. There is no evidence of aphasia, agnosia, apraxia or anomia. Speech is clear with normal prosody and enunciation. Thought process is linear. Mood is normal and affect is normal.  Cranial nerves II - XII are as described above under HEENT exam. In addition: shoulder shrug is normal with equal shoulder height noted. Motor exam: Normal bulk, strength and tone is noted in the upper extremities, in the lower extremities he has mild bilateral hip flexor weakness, right perhaps worse than left. Thigh muscles appear to be thinner than the rest of his body build would suggest. There is no drift, tremor or rebound. Romberg is  positive. Reflexes are 1+  in the upper extremities and absent in the lower extremities. Fine motor skills and coordination: overall mildly slow in the upper and lower extremities, no significant decrement in amplitude, no lateralization. Cerebellar testing: No dysmetria or intention tremor on finger to nose testing. Heel to shin is difficult bilaterally.    Sensory exam: decreased in the hands and decreased in the lower extremities symmetrically up to below knees bilaterally, stable. Gait, station and balance: He stands with difficulty. No veering to one side is noted. No leaning to one side is noted. Posture is  age-appropriate to mildly stooped, stance is wide-based, he walks slowly and cautiously and relies on his cane. He turns slowly. Tandem walk is not possible.   Assessment and Plan:   In summary, Thomas Vega is a very pleasant 82 year old male with an underlying medical history of prostate cancer, s/p cataract removal b/l in 2015, hypertension, arthritis, status post knee replacement surgery 2004, melanoma, s/p removal from back and and left shoulder, spinal stenosis, pneumonia in April 2016 with hospitalization from 06/28/2014 through 07/01/2014, chronic low back pain, status post epidural steroid injections, status post back surgery, who presents for follow up consultation of his gait and balance problems, fall risk, fine motor dyscontrol, with progression noted over the past 3+ years.  On examination, he has evidence of neuropathy. Workup included EMG and nerve conduction testing which confirmed multi-level radiculopathies in the lumbar region as well as evidence of peripheral neuropathy. His exam is stable from last time, he has evidence of mild lower extremity proximal weakness, right more than left. He has a non-specific gait disorder, likely due to degenerative spine disease of the lower back, with spinal stenosis, back pain reported, peripheral neuropathy, not always hydrating well enough, normal aging.  we talked about doing a brain MRI last time and I reviewed this again with him today, since his history is not suggestive of any strokelike symptoms or TIA, and he is not keen on pursuing yet another scan, we mutually agreed to forego this for now, but we will consider this in the future if needed. At this juncture, we mutually agreed to an as needed follow-up. He is advised to hydrate well, change positions slowly and use his cane for safety at all times.  He has done physical therapy and while it helped at the time he did not think it had any sustained improvements.  I answered all their questions  today and they were in agreement.  I spent 25 minutes in total face-to-face time with the patient, more than 50% of which was spent in counseling and coordination of care, reviewing test results, reviewing medication and discussing or reviewing the diagnosis of gait imbalance problems, the prognosis and treatment options.

## 2015-11-24 NOTE — Patient Instructions (Addendum)
Your exam is stable.  Please change positions slowly and try to hydrate well, use your cane for safety.  As discussed, I will see you back as needed. If needed in the future, we will do a brain MRI.  Labs were fine too.

## 2015-12-09 DIAGNOSIS — I10 Essential (primary) hypertension: Secondary | ICD-10-CM | POA: Diagnosis not present

## 2015-12-16 DIAGNOSIS — Z125 Encounter for screening for malignant neoplasm of prostate: Secondary | ICD-10-CM | POA: Diagnosis not present

## 2015-12-16 DIAGNOSIS — I10 Essential (primary) hypertension: Secondary | ICD-10-CM | POA: Diagnosis not present

## 2015-12-16 DIAGNOSIS — Z0000 Encounter for general adult medical examination without abnormal findings: Secondary | ICD-10-CM | POA: Diagnosis not present

## 2015-12-16 DIAGNOSIS — R739 Hyperglycemia, unspecified: Secondary | ICD-10-CM | POA: Diagnosis not present

## 2015-12-24 DIAGNOSIS — M4806 Spinal stenosis, lumbar region: Secondary | ICD-10-CM | POA: Diagnosis not present

## 2015-12-24 DIAGNOSIS — M5416 Radiculopathy, lumbar region: Secondary | ICD-10-CM | POA: Diagnosis not present

## 2015-12-30 ENCOUNTER — Ambulatory Visit (INDEPENDENT_AMBULATORY_CARE_PROVIDER_SITE_OTHER): Payer: Medicare Other | Admitting: Orthopedic Surgery

## 2015-12-30 DIAGNOSIS — M25512 Pain in left shoulder: Secondary | ICD-10-CM | POA: Diagnosis not present

## 2015-12-31 ENCOUNTER — Ambulatory Visit: Payer: Medicare Other | Admitting: Physical Therapy

## 2016-01-20 ENCOUNTER — Ambulatory Visit (INDEPENDENT_AMBULATORY_CARE_PROVIDER_SITE_OTHER): Payer: Medicare Other | Admitting: Orthopedic Surgery

## 2016-01-20 ENCOUNTER — Encounter (INDEPENDENT_AMBULATORY_CARE_PROVIDER_SITE_OTHER): Payer: Self-pay | Admitting: Orthopedic Surgery

## 2016-01-20 DIAGNOSIS — M25512 Pain in left shoulder: Secondary | ICD-10-CM | POA: Diagnosis not present

## 2016-01-20 NOTE — Progress Notes (Signed)
Office Visit Note   Patient: Thomas Vega           Date of Birth: 07-01-1926           MRN: [id number] Visit Date: 01/20/2016 Requested by: Jani Gravel, MD [facility] [place], [place] [address] PCP: Jani Gravel, MD  Subjective: Chief Complaint  Patient presents with  . Left Shoulder - Pain  Patient describes continued left shoulder pain since his fall into a door frame 2 weeks ago.  It is however improved.  Denies any neck pain or radicular symptoms.  Takes Aleve occasionally for his symptoms.  Patient returns in followup for his left shoulder, s/p fall into door frame on 12/29/15.  Still has pain with ROM, says it is ok if he just rests it down.  Has d/c sling.  He is driving.  He is taking aleve prn for pain.  He asks if we wanted to proceed with MRI?  It hurts intermittently, but when it does hurt, it will not ease up until he takes an aleve.  He sees Dr Louanne Skye here usually, and Dr Louanne Skye had referred him to PT for his back/balance, and patient has not done the PT yet due to this shoulder injury.                 Review of Systems all systems reviewed and negative is a related to the left shoulder no fevers or chills   Assessment & Plan: Visit Diagnoses: No diagnosis found.  Plan: Impression is possible rotator cuff tear left shoulder in an 82 year old patient who is generally functional.  I asked him if his shoulder was bothering him enough for surgery and he said no.  I think the best course for [place] would be 2 more weeks of rest followed by 4-6 weeks of therapy for the shoulder.  If at that time he remains symptomatic we could consider MRI scanning to quantify size and retraction of the tear and considered surgery at that time.  All see him back as needed.  Therapy prescription is provided.  Follow-Up Instructions: No Follow-up on file.   Orders:  No orders of the defined types were placed in this encounter.  No orders of the defined types were placed in this  encounter.     Procedures: No procedures performed   Clinical Data: No additional findings.  Objective: Vital Signs: There were no vitals taken for this visit.  Physical Exam  Constitutional: He appears well-developed.  HENT:  Head: Normocephalic.  Eyes: EOM are normal.  Neck: Normal range of motion.  Cardiovascular: Normal rate.   Pulmonary/Chest: Effort normal.  Neurological: He is alert.  Skin: Skin is warm.  Psychiatric: He has a normal mood and affect.    Ortho Exam left shoulder exam demonstrates a little coarseness with internal/external rotation.  Also has slight weakness to infraspinatus testing on the left compared to the right.  Motor sensory function to the hand is intact passive range of motion is maintained no other masses lymph adenopathy or skin changes noted in the left shoulder region  Specialty Comments:  No specialty comments available.  Imaging: No results found.   PMFS History: Patient Active Problem List   Diagnosis Date Noted  . Spinal stenosis 09/02/2015  . Numbness 09/02/2015  . Bradycardia 07/01/2014  . Thrombocytopenia ([place]) 06/30/2014  . CAP (community acquired pneumonia) 06/29/2014  . Hypoxia 06/29/2014  . HTN (hypertension) 06/29/2014  . Prostate cancer ([place]) 06/29/2014  . Hyponatremia 06/29/2014  Past Medical History:  Diagnosis Date  . Abnormal finding of blood chemistry   . Bradycardia 07/01/2014  . CAP (community acquired pneumonia) 06/29/2014  . Heart murmur   . Irregular heart rate   . Melanoma ([place])   . Prostate cancer ([place])   . Thrombocytopenia ([place]) 06/30/2014    Family History  Problem Relation Age of Onset  . Heart Problems Mother   . Stroke Father     Past Surgical History:  Procedure Laterality Date  . BACK SURGERY    . CATARACT EXTRACTION    . KNEE SURGERY    . MELANOMA EXCISION     Social History   Occupational History  . Retired     Social History Main Topics  . Smoking status: Never Smoker  .  Smokeless tobacco: Not on file  . Alcohol use No  . Drug use: No  . Sexual activity: Not on file

## 2016-02-01 ENCOUNTER — Ambulatory Visit (INDEPENDENT_AMBULATORY_CARE_PROVIDER_SITE_OTHER): Payer: Medicare Other | Admitting: Specialist

## 2016-02-04 ENCOUNTER — Ambulatory Visit: Payer: Medicare Other | Attending: Orthopedic Surgery | Admitting: Physical Therapy

## 2016-02-04 DIAGNOSIS — M25512 Pain in left shoulder: Secondary | ICD-10-CM

## 2016-02-04 DIAGNOSIS — M25612 Stiffness of left shoulder, not elsewhere classified: Secondary | ICD-10-CM | POA: Diagnosis not present

## 2016-02-04 DIAGNOSIS — M6281 Muscle weakness (generalized): Secondary | ICD-10-CM | POA: Diagnosis not present

## 2016-02-04 NOTE — Therapy (Signed)
[facility] [place], [place], [address] Phone: [phone]   Fax:  [phone]  Physical Therapy Evaluation  Patient Details  Name: Thomas Vega MRN: [id number] Date of Birth: 1926/10/08 Referring Provider: Meredith Pel MD  Encounter Date: 02/04/2016      PT End of Session - 02/04/16 1434    Visit Number 1   Number of Visits 16   Date for PT Re-Evaluation 04/04/16   PT Start Time [id number]   PT Stop Time 1228   PT Time Calculation (min) 53 min   Activity Tolerance Patient tolerated treatment well   Behavior During Therapy [facility] for tasks assessed/performed      Past Medical History:  Diagnosis Date  . Abnormal finding of blood chemistry   . Bradycardia 07/01/2014  . CAP (community acquired pneumonia) 06/29/2014  . Heart murmur   . Irregular heart rate   . Melanoma ([place])   . Prostate cancer ([place])   . Thrombocytopenia ([place]) 06/30/2014    Past Surgical History:  Procedure Laterality Date  . BACK SURGERY    . CATARACT EXTRACTION    . KNEE SURGERY    . MELANOMA EXCISION      There were no vitals filed for this visit.       Subjective Assessment - 02/04/16 1439    Subjective The patient reports several weeks ago he caught his toe and fell against a door at home onto his left shoulder.  He states initially that he was in a great deal of pain.  His pain has been decreasing  His pain-level is a 6/10 today but can become high with certain left shoulder movements.  Heat and cold decrease his pain.  He c/o referred pain into his left upper arm.   Patient Stated Goals Use my left UE without pain.   Currently in Pain? Yes   Pain Score 6    Pain Location Shoulder   Pain Orientation Left   Pain Descriptors / Indicators Aching;Sharp   Pain Type Acute pain   Pain Onset More than a month ago   Pain Frequency Constant   Aggravating Factors  Please see above.   Pain Relieving Factors Please see above.             [facility] PT Assessment - 02/04/16 0001      Assessment   Medical Diagnosis Left shoulder pain.   Referring Provider Meredith Pel MD   Onset Date/Surgical Date --  Several weeks.     Precautions   Precautions Fall     Restrictions   Weight Bearing Restrictions No     Balance Screen   Has the patient fallen in the past 6 months Yes   How many times? --  6.Marland KitchenMarland KitchenMarland KitchenHe states he is using a cane now.   Has the patient had a decrease in activity level because of a fear of falling?  Yes   Is the patient reluctant to leave their home because of a fear of falling?  Yes     [place] residence     Prior Function   Level of Independence Independent with household mobility with device     Posture/Postural Control   Posture/Postural Control Postural limitations   Postural Limitations Rounded Shoulders;Forward head;Decreased lumbar lordosis     ROM / Strength   AROM / PROM / Strength AROM;Strength     AROM   Overall AROM Comments Antigravity left shoulder flexion= 110 degrees; ER=  68 degrees.     Strength   Overall Strength Comments Left shoulder ER= 4-/5.     Palpation   Palpation comment Tender to palpation in left posterior cuff with c/o referred pain into left middle deltoid region.     Special Tests    Special Tests Rotator Cuff Impingement   Rotator Cuff Impingment tests Neer impingement test;Drop Arm test     Neer Impingement test    Findings Positive   Side Left     Drop Arm test   Findings Negative   Side Left     Ambulation/Gait   Gait Comments Patient ambulating with a straight cane in the clinic today.                   [facility] Adult PT Treatment/Exercise - 02/04/16 0001      Modalities   Modalities Electrical Stimulation;Moist Heat     Moist Heat Therapy   Number Minutes Moist Heat 15 Minutes   Moist Heat Location --  Left shoulder.     Acupuncturist Location --  Left  shoulder.   Electrical Stimulation Action IFC   Electrical Stimulation Parameters 80-150 Hz at 100% scan x 15 minutes.   Electrical Stimulation Goals Pain                  PT Short Term Goals - 02/04/16 1456      PT SHORT TERM GOAL #1   Title Ind with initial HEP.   Time 2   Period Weeks   Status New           PT Long Term Goals - 02/04/16 1456      PT LONG TERM GOAL #1   Title Ind with advanced HEP.   Time 8   Period Weeks   Status New     PT LONG TERM GOAL #2   Title Active left shoulder flexion to 135 degrees to assist with the accomplishment of functional activties.   Time 8   Period Weeks   Status New     PT LONG TERM GOAL #3   Title Active left shoulder ER= 80 degrees.   Time 8   Period Weeks   Status New     PT LONG TERM GOAL #4   Title Left shoulder ER strength= 4+/5.   Time 8   Period Weeks   Status New     PT LONG TERM GOAL #5   Title Perform ADL's with left shoulder pain not > 2-3/10.   Time 8   Period Weeks   Status New     Additional Long Term Goals   Additional Long Term Goals Yes     PT LONG TERM GOAL #6   Title Eliminate referred pain symptoms.   Time 8   Period Weeks   Status New               Plan - 02/04/16 1452    Clinical Impression Statement The patient's left shoulder is remarkable for a positive impingement test.  He demonstrates some weakness into left shoulder ER and lack range of motion (when contralaterally compared) into left shoulder flexion and ER.  He has referred pain into the left middle deltoid region.   Rehab Potential Excellent   PT Frequency 2x / week   PT Duration 8 weeks   PT Treatment/Interventions ADLs/Self Care Home Management;Cryotherapy;Electrical Stimulation;Ultrasound;Moist Heat;Therapeutic activities;Therapeutic exercise;Patient/family education;Passive range of motion;Manual techniques;Dry needling   PT Next  Visit Plan Combo e'stim/U/S to left posterior cuff region; STW/M; E'stim; UE  [place] into flexion and ER; RW4; sdly ER; scapular strengthening; full can.   Consulted and Agree with Plan of Care Patient      Patient will benefit from skilled therapeutic intervention in order to improve the following deficits and impairments:  Pain, Decreased activity tolerance, Decreased range of motion, Decreased strength  Visit Diagnosis: Acute pain of left shoulder - Plan: PT plan of care cert/re-cert  Stiffness of left shoulder, not elsewhere classified - Plan: PT plan of care cert/re-cert  Muscle weakness (generalized) - Plan: PT plan of care cert/re-cert      G-Codes - [id number]    Functional Assessment Tool Used Clinical judgement   Functional Limitation Self care   Self Care Current Status [id number]) At least 40 percent but less than 60 percent impaired, limited or restricted   Self Care Goal Status [id number]) At least 1 percent but less than 20 percent impaired, limited or restricted       Problem List Patient Active Problem List   Diagnosis Date Noted  . Spinal stenosis 09/02/2015  . Numbness 09/02/2015  . Bradycardia 07/01/2014  . Thrombocytopenia ([place]) 06/30/2014  . CAP (community acquired pneumonia) 06/29/2014  . Hypoxia 06/29/2014  . HTN (hypertension) 06/29/2014  . Prostate cancer ([place]) 06/29/2014  . Hyponatremia 06/29/2014    Clayton Jarmon, [place] MPT 02/04/2016, 3:03 PM  [facility] [place], [place], [address] Phone: [phone]   Fax:  [phone]  Name: Thomas Vega MRN: [id number] Date of Birth: [birth year]

## 2016-02-08 ENCOUNTER — Ambulatory Visit: Payer: Medicare Other | Admitting: Physical Therapy

## 2016-02-08 ENCOUNTER — Encounter: Payer: Self-pay | Admitting: Physical Therapy

## 2016-02-08 DIAGNOSIS — M25512 Pain in left shoulder: Secondary | ICD-10-CM | POA: Diagnosis not present

## 2016-02-08 DIAGNOSIS — M25612 Stiffness of left shoulder, not elsewhere classified: Secondary | ICD-10-CM

## 2016-02-08 DIAGNOSIS — Z23 Encounter for immunization: Secondary | ICD-10-CM | POA: Diagnosis not present

## 2016-02-08 DIAGNOSIS — M6281 Muscle weakness (generalized): Secondary | ICD-10-CM

## 2016-02-08 NOTE — Therapy (Signed)
[facility] [place], [place], [address] Phone: [phone]   Fax:  [phone]  Physical Therapy Treatment  Patient Details  Name: Thomas Vega MRN: [id number] Date of Birth: Apr 03, 1926 Referring Provider: Meredith Pel MD  Encounter Date: 02/08/2016      PT End of Session - 02/08/16 1042    Visit Number 2   Number of Visits 16   Date for PT Re-Evaluation 04/04/16   PT Start Time 1036   PT Stop Time 1121   PT Time Calculation (min) 45 min   Activity Tolerance Patient tolerated treatment well   Behavior During Therapy [facility] for tasks assessed/performed      Past Medical History:  Diagnosis Date  . Abnormal finding of blood chemistry   . Bradycardia 07/01/2014  . CAP (community acquired pneumonia) 06/29/2014  . Heart murmur   . Irregular heart rate   . Melanoma ([place])   . Prostate cancer ([place])   . Thrombocytopenia ([place]) 06/30/2014    Past Surgical History:  Procedure Laterality Date  . BACK SURGERY    . CATARACT EXTRACTION    . KNEE SURGERY    . MELANOMA EXCISION      There were no vitals filed for this visit.      Subjective Assessment - 02/08/16 1040    Subjective Reports that his shoulder is sore this morning. Brought in TENS unit and pulley to be set up by MPT.   Patient Stated Goals Use my left UE without pain.   Currently in Pain? Yes   Pain Score 7    Pain Location Shoulder   Pain Orientation Left   Pain Descriptors / Indicators Sore   Pain Type Acute pain   Pain Onset More than a month ago   Pain Frequency Intermittent            [facility] PT Assessment - 02/08/16 0001      Assessment   Medical Diagnosis Left shoulder pain.   Next MD Visit TBD     Precautions   Precautions Fall     Restrictions   Weight Bearing Restrictions No                     [facility] Adult PT Treatment/Exercise - 02/08/16 0001      Exercises   Exercises Shoulder     Shoulder Exercises: Seated    Extension Strengthening;Left;20 reps;Theraband   Theraband Level (Shoulder Extension) Level 1 (Yellow)   Retraction Strengthening;Left;20 reps;Theraband   Theraband Level (Shoulder Retraction) Level 1 (Yellow)   External Rotation Strengthening;Left;20 reps;Theraband   Theraband Level (Shoulder External Rotation) Level 1 (Yellow)   Internal Rotation Strengthening;Left;20 reps;Theraband   Theraband Level (Shoulder Internal Rotation) Level 1 (Yellow)     Shoulder Exercises: Pulleys   Flexion Other (comment)  x5 min; patient reported discomfort   Other Pulley Exercises Seated LUE ranger into flex/ CW/ CCW circles x20 reps  CCW circles more difficult per patient report     Modalities   Modalities Electrical Stimulation;Moist Heat;Ultrasound     Moist Heat Therapy   Number Minutes Moist Heat 15 Minutes   Moist Heat Location Shoulder     Electrical Stimulation   Electrical Stimulation Location L shoulder   Electrical Stimulation Action Pre-Mod   Electrical Stimulation Parameters 80-150 hz x15 min   Electrical Stimulation Goals Pain     Ultrasound   Ultrasound Location L posteriolateral shoulder   Ultrasound Parameters 1.3 w/cm2, 100%, 1 mhz x10  min   Ultrasound Goals Pain                PT Education - 02/08/16 1117    Education provided Yes   Education Details HEP- home pulley with education to bring pulley or TENS unit back if he had any questions   Person(s) Educated Patient   Methods Explanation;Demonstration;Verbal cues   Comprehension Verbalized understanding;Returned demonstration;Verbal cues required          PT Short Term Goals - 02/04/16 1456      PT SHORT TERM GOAL #1   Title Ind with initial HEP.   Time 2   Period Weeks   Status New           PT Long Term Goals - 02/04/16 1456      PT LONG TERM GOAL #1   Title Ind with advanced HEP.   Time 8   Period Weeks   Status New     PT LONG TERM GOAL #2   Title Active left shoulder flexion to  135 degrees to assist with the accomplishment of functional activties.   Time 8   Period Weeks   Status New     PT LONG TERM GOAL #3   Title Active left shoulder ER= 80 degrees.   Time 8   Period Weeks   Status New     PT LONG TERM GOAL #4   Title Left shoulder ER strength= 4+/5.   Time 8   Period Weeks   Status New     PT LONG TERM GOAL #5   Title Perform ADL's with left shoulder pain not > 2-3/10.   Time 8   Period Weeks   Status New     Additional Long Term Goals   Additional Long Term Goals Yes     PT LONG TERM GOAL #6   Title Eliminate referred pain symptoms.   Time 8   Period Weeks   Status New               Plan - 02/08/16 1112    Clinical Impression Statement Patient presented in clinic today with L shoulder soreness intermittantly. Patient experienced greatest discomfort with seated flexion pulley and with CCW circles with UE ranger. Patient introduced to L shoulder strengthening with yellow theraband and had no complaints with theraband strengthening. Patient's TENS unit and pulley set up by Mali Applegate, MPT and patient educated on technique and encouraged to bring back if he had any questions. Normal modalities response noted following removal of the modalities. Patient experienced L shoulder feeling "better" following today's treatment.   Rehab Potential Excellent   PT Frequency 2x / week   PT Duration 8 weeks   PT Treatment/Interventions ADLs/Self Care Home Management;Cryotherapy;Electrical Stimulation;Ultrasound;Moist Heat;Therapeutic activities;Therapeutic exercise;Patient/family education;Passive range of motion;Manual techniques;Dry needling   PT Next Visit Plan Continue with L shoulder ROM and strengthening exercises with modalities PRN per MPT POC.   PT Home Exercise Plan HEP- Home pulley per VCs   Consulted and Agree with Plan of Care Patient      Patient will benefit from skilled therapeutic intervention in order to improve the following  deficits and impairments:  Pain, Decreased activity tolerance, Decreased range of motion, Decreased strength  Visit Diagnosis: Acute pain of left shoulder  Stiffness of left shoulder, not elsewhere classified  Muscle weakness (generalized)     Problem List Patient Active Problem List   Diagnosis Date Noted  . Spinal stenosis 09/02/2015  . Numbness 09/02/2015  .  Bradycardia 07/01/2014  . Thrombocytopenia ([place]) 06/30/2014  . CAP (community acquired pneumonia) 06/29/2014  . Hypoxia 06/29/2014  . HTN (hypertension) 06/29/2014  . Prostate cancer ([place]) 06/29/2014  . Hyponatremia 06/29/2014    Wynelle Fanny, PTA 02/08/2016, 11:30 AM  [facility] [address] [place], [place], [address] Phone: [phone]   Fax:  [phone]  Name: Thomas Vega MRN: [id number] Date of Birth: 03-16-1927

## 2016-02-11 ENCOUNTER — Ambulatory Visit: Payer: Medicare Other | Admitting: Physical Therapy

## 2016-02-11 ENCOUNTER — Encounter: Payer: Self-pay | Admitting: Physical Therapy

## 2016-02-11 DIAGNOSIS — M25612 Stiffness of left shoulder, not elsewhere classified: Secondary | ICD-10-CM

## 2016-02-11 DIAGNOSIS — M6281 Muscle weakness (generalized): Secondary | ICD-10-CM

## 2016-02-11 DIAGNOSIS — M25512 Pain in left shoulder: Secondary | ICD-10-CM | POA: Diagnosis not present

## 2016-02-11 NOTE — Therapy (Signed)
[facility] [place], [place], [address] Phone: [phone]   Fax:  [phone]  Physical Therapy Treatment  Patient Details  Name: Thomas Vega MRN: [id number] Date of Birth: 07/08/1926 Referring Provider: Meredith Pel MD  Encounter Date: 02/11/2016      PT End of Session - 02/11/16 1034    Visit Number 3   Number of Visits 16   Date for PT Re-Evaluation 04/04/16   PT Start Time 1031   PT Stop Time 1123   PT Time Calculation (min) 52 min   Activity Tolerance Patient tolerated treatment well   Behavior During Therapy [facility] for tasks assessed/performed      Past Medical History:  Diagnosis Date  . Abnormal finding of blood chemistry   . Bradycardia 07/01/2014  . CAP (community acquired pneumonia) 06/29/2014  . Heart murmur   . Irregular heart rate   . Melanoma ([place])   . Prostate cancer ([place])   . Thrombocytopenia ([place]) 06/30/2014    Past Surgical History:  Procedure Laterality Date  . BACK SURGERY    . CATARACT EXTRACTION    . KNEE SURGERY    . MELANOMA EXCISION      There were no vitals filed for this visit.      Subjective Assessment - 02/11/16 1032    Subjective Reports that he has been having some pain down by the L tricep attachment close to his elbow.   Patient Stated Goals Use my left UE without pain.   Currently in Pain? Yes   Pain Score 4    Pain Location Shoulder   Pain Orientation Left   Pain Descriptors / Indicators Aching   Pain Type Acute pain   Pain Onset More than a month ago            [facility] PT Assessment - 02/11/16 0001      Assessment   Medical Diagnosis Left shoulder pain.   Next MD Visit TBD     Precautions   Precautions Fall     Restrictions   Weight Bearing Restrictions No                     [facility] Adult PT Treatment/Exercise - 02/11/16 0001      Shoulder Exercises: Seated   Extension Strengthening;Left;20 reps;Theraband   Theraband Level  (Shoulder Extension) Level 1 (Yellow)   Retraction Strengthening;Left;20 reps;Theraband   Theraband Level (Shoulder Retraction) Level 1 (Yellow)   External Rotation Strengthening;Left;20 reps;Theraband   Theraband Level (Shoulder External Rotation) Level 1 (Yellow)   Internal Rotation Strengthening;Left;20 reps;Theraband   Theraband Level (Shoulder Internal Rotation) Level 1 (Yellow)     Shoulder Exercises: Pulleys   Flexion Other (comment)  x6 min   Other Pulley Exercises Seated LUE ranger into flex/ CW/ CCW circles x30 reps     Modalities   Modalities Electrical Stimulation;Moist Heat;Ultrasound     Moist Heat Therapy   Number Minutes Moist Heat 15 Minutes   Moist Heat Location Shoulder     Electrical Stimulation   Electrical Stimulation Location L shoulder   Electrical Stimulation Action L Deltoid/ Teres Region   Electrical Stimulation Parameters 80-150 hz x15 min   Electrical Stimulation Goals Pain     Ultrasound   Ultrasound Location L posteriolateral shoulder   Ultrasound Parameters 1.5 w/cm2 ,100%, 1 mhz x15 min   Ultrasound Goals Pain                  PT  Short Term Goals - 02/04/16 1456      PT SHORT TERM GOAL #1   Title Ind with initial HEP.   Time 2   Period Weeks   Status New           PT Long Term Goals - 02/04/16 1456      PT LONG TERM GOAL #1   Title Ind with advanced HEP.   Time 8   Period Weeks   Status New     PT LONG TERM GOAL #2   Title Active left shoulder flexion to 135 degrees to assist with the accomplishment of functional activties.   Time 8   Period Weeks   Status New     PT LONG TERM GOAL #3   Title Active left shoulder ER= 80 degrees.   Time 8   Period Weeks   Status New     PT LONG TERM GOAL #4   Title Left shoulder ER strength= 4+/5.   Time 8   Period Weeks   Status New     PT LONG TERM GOAL #5   Title Perform ADL's with left shoulder pain not > 2-3/10.   Time 8   Period Weeks   Status New      Additional Long Term Goals   Additional Long Term Goals Yes     PT LONG TERM GOAL #6   Title Eliminate referred pain symptoms.   Time 8   Period Weeks   Status New               Plan - 02/11/16 1111    Clinical Impression Statement Patient presented in clinic with a low mid level L shoulder ache. Patient able to tolerate gentle L shoulder exercises fairly well although he reported experiencing L posterior deltoid discomfort with L shoulder row with yellow theraband. Patient did not have any discomfort with L shoulder extension. Normal modalities response noted following removal of the modalities. Tightness was present in L deltoids and Teres region upon palpation. Patient reported L shoulder feeling "better" upon end of treatment.   Rehab Potential Excellent   PT Frequency 2x / week   PT Duration 8 weeks   PT Treatment/Interventions ADLs/Self Care Home Management;Cryotherapy;Electrical Stimulation;Ultrasound;Moist Heat;Therapeutic activities;Therapeutic exercise;Patient/family education;Passive range of motion;Manual techniques;Dry needling   PT Next Visit Plan Continue with L shoulder ROM and strengthening exercises with modalities PRN per MPT POC.   PT Home Exercise Plan HEP- Home pulley per VCs   Consulted and Agree with Plan of Care Patient      Patient will benefit from skilled therapeutic intervention in order to improve the following deficits and impairments:  Pain, Decreased activity tolerance, Decreased range of motion, Decreased strength  Visit Diagnosis: Acute pain of left shoulder  Stiffness of left shoulder, not elsewhere classified  Muscle weakness (generalized)     Problem List Patient Active Problem List   Diagnosis Date Noted  . Spinal stenosis 09/02/2015  . Numbness 09/02/2015  . Bradycardia 07/01/2014  . Thrombocytopenia ([place]) 06/30/2014  . CAP (community acquired pneumonia) 06/29/2014  . Hypoxia 06/29/2014  . HTN (hypertension) 06/29/2014  .  Prostate cancer ([place]) 06/29/2014  . Hyponatremia 06/29/2014    Wynelle Fanny, PTA 02/11/2016, 11:36 AM  [facility] [address] [place], [place], [address] Phone: [phone]   Fax:  [phone]  Name: Avelino Lanoux MRN: [id number] Date of Birth: Jun 02, 1926

## 2016-02-15 ENCOUNTER — Ambulatory Visit: Payer: Medicare Other | Admitting: *Deleted

## 2016-02-15 DIAGNOSIS — M25612 Stiffness of left shoulder, not elsewhere classified: Secondary | ICD-10-CM

## 2016-02-15 DIAGNOSIS — M6281 Muscle weakness (generalized): Secondary | ICD-10-CM | POA: Diagnosis not present

## 2016-02-15 DIAGNOSIS — M25512 Pain in left shoulder: Secondary | ICD-10-CM | POA: Diagnosis not present

## 2016-02-15 NOTE — Therapy (Signed)
[facility] [place], [place], [address] Phone: [phone]   Fax:  [phone]  Physical Therapy Treatment  Patient Details  Name: Thomas Vega MRN: [id number] Date of Birth: 1926/07/02 Referring Provider: Meredith Pel MD  Encounter Date: 02/15/2016      PT End of Session - 02/15/16 1109    Visit Number 4   Number of Visits 16   Date for PT Re-Evaluation 04/04/16   PT Start Time 1030   PT Stop Time 1120   PT Time Calculation (min) 50 min      Past Medical History:  Diagnosis Date  . Abnormal finding of blood chemistry   . Bradycardia 07/01/2014  . CAP (community acquired pneumonia) 06/29/2014  . Heart murmur   . Irregular heart rate   . Melanoma ([place])   . Prostate cancer ([place])   . Thrombocytopenia ([place]) 06/30/2014    Past Surgical History:  Procedure Laterality Date  . BACK SURGERY    . CATARACT EXTRACTION    . KNEE SURGERY    . MELANOMA EXCISION      There were no vitals filed for this visit.      Subjective Assessment - 02/15/16 1034    Subjective Reports that he has been having some pain down by the L tricep attachment close to his elbow.   Did good sfter last Rx   Patient Stated Goals Use my left UE without pain.   Currently in Pain? Yes   Pain Score 4    Pain Location Shoulder   Pain Orientation Left   Pain Type Acute pain   Pain Onset More than a month ago   Pain Frequency Intermittent                         [facility] Adult PT Treatment/Exercise - 02/15/16 0001      Shoulder Exercises: Seated   Extension Strengthening;Left;20 reps;Theraband   Theraband Level (Shoulder Extension) Level 1 (Yellow)   Retraction Strengthening;Left;20 reps;Theraband   Theraband Level (Shoulder Retraction) Level 1 (Yellow)   External Rotation Strengthening;Left;20 reps;Theraband   Theraband Level (Shoulder External Rotation) Level 1 (Yellow)   Internal Rotation Strengthening;Left;20  reps;Theraband   Theraband Level (Shoulder Internal Rotation) Level 1 (Yellow)     Shoulder Exercises: Pulleys   Flexion Other (comment)  x6 min   Other Pulley Exercises Seated LUE ranger into flex/ CW/ CCW circles x30 reps     Modalities   Modalities Electrical Stimulation;Moist Heat;Ultrasound     Moist Heat Therapy   Number Minutes Moist Heat 15 Minutes   Moist Heat Location Shoulder     Electrical Stimulation   Electrical Stimulation Location L shoulder Teres area 80-150hz  x 15 mins   Electrical Stimulation Goals Pain     Ultrasound   Ultrasound Location LT shldr posteriolateral aspect   Ultrasound Parameters 1.5 w/cm2 x 10 min in sitting   Ultrasound Goals Pain                  PT Short Term Goals - 02/04/16 1456      PT SHORT TERM GOAL #1   Title Ind with initial HEP.   Time 2   Period Weeks   Status New           PT Long Term Goals - 02/04/16 1456      PT LONG TERM GOAL #1   Title Ind with advanced HEP.   Time 8  Period Weeks   Status New     PT LONG TERM GOAL #2   Title Active left shoulder flexion to 135 degrees to assist with the accomplishment of functional activties.   Time 8   Period Weeks   Status New     PT LONG TERM GOAL #3   Title Active left shoulder ER= 80 degrees.   Time 8   Period Weeks   Status New     PT LONG TERM GOAL #4   Title Left shoulder ER strength= 4+/5.   Time 8   Period Weeks   Status New     PT LONG TERM GOAL #5   Title Perform ADL's with left shoulder pain not > 2-3/10.   Time 8   Period Weeks   Status New     Additional Long Term Goals   Additional Long Term Goals Yes     PT LONG TERM GOAL #6   Title Eliminate referred pain symptoms.   Time 8   Period Weeks   Status New               Plan - 02/15/16 1126    Clinical Impression Statement Pt did fairly well with Rx today and was able to tolerate AAROM exs and some gentle strengthening with yellow tband. He had some discomfort with  forward punching and elevation motion, biut did well with other motions. He still needs some  V/Cs with technique. Normal resonse with modalities   Rehab Potential Excellent   PT Frequency 2x / week   PT Duration 8 weeks   PT Treatment/Interventions ADLs/Self Care Home Management;Cryotherapy;Electrical Stimulation;Ultrasound;Moist Heat;Therapeutic activities;Therapeutic exercise;Patient/family education;Passive range of motion;Manual techniques;Dry needling   PT Next Visit Plan Continue with L shoulder ROM and strengthening exercises with modalities PRN per MPT POC.   PT Home Exercise Plan HEP- Home pulley per VCs   Consulted and Agree with Plan of Care Patient      Patient will benefit from skilled therapeutic intervention in order to improve the following deficits and impairments:  Pain, Decreased activity tolerance, Decreased range of motion, Decreased strength  Visit Diagnosis: Acute pain of left shoulder  Stiffness of left shoulder, not elsewhere classified  Muscle weakness (generalized)     Problem List Patient Active Problem List   Diagnosis Date Noted  . Spinal stenosis 09/02/2015  . Numbness 09/02/2015  . Bradycardia 07/01/2014  . Thrombocytopenia ([place]) 06/30/2014  . CAP (community acquired pneumonia) 06/29/2014  . Hypoxia 06/29/2014  . HTN (hypertension) 06/29/2014  . Prostate cancer ([place]) 06/29/2014  . Hyponatremia 06/29/2014    RAMSEUR,CHRIS, PTA 02/15/2016, 11:30 AM  [facility] [place], [place], [address] Phone: [phone]   Fax:  [phone]  Name: Thomas Vega MRN: [id number] Date of Birth: [birth year]

## 2016-02-17 ENCOUNTER — Ambulatory Visit: Payer: Medicare Other | Admitting: Physical Therapy

## 2016-02-17 ENCOUNTER — Encounter: Payer: Self-pay | Admitting: Physical Therapy

## 2016-02-17 DIAGNOSIS — M25512 Pain in left shoulder: Secondary | ICD-10-CM | POA: Diagnosis not present

## 2016-02-17 DIAGNOSIS — M25612 Stiffness of left shoulder, not elsewhere classified: Secondary | ICD-10-CM

## 2016-02-17 DIAGNOSIS — M6281 Muscle weakness (generalized): Secondary | ICD-10-CM

## 2016-02-17 NOTE — Therapy (Signed)
[facility] [place], [place], [address] Phone: [phone]   Fax:  [phone]  Physical Therapy Treatment  Patient Details  Name: Thomas Vega MRN: [id number] Date of Birth: 07-03-1926 Referring Provider: Meredith Pel MD  Encounter Date: 02/17/2016      PT End of Session - 02/17/16 1106    Visit Number 5   Number of Visits 16   Date for PT Re-Evaluation 04/04/16   PT Start Time 1028   PT Stop Time 1116   PT Time Calculation (min) 48 min   Activity Tolerance Patient tolerated treatment well   Behavior During Therapy [facility] for tasks assessed/performed      Past Medical History:  Diagnosis Date  . Abnormal finding of blood chemistry   . Bradycardia 07/01/2014  . CAP (community acquired pneumonia) 06/29/2014  . Heart murmur   . Irregular heart rate   . Melanoma ([place])   . Prostate cancer ([place])   . Thrombocytopenia ([place]) 06/30/2014    Past Surgical History:  Procedure Laterality Date  . BACK SURGERY    . CATARACT EXTRACTION    . KNEE SURGERY    . MELANOMA EXCISION      There were no vitals filed for this visit.      Subjective Assessment - 02/17/16 1029    Subjective Reports that he has one spot that is painful today with indicating acromioclavicular area. 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   Patient Stated Goals Use my left UE without pain.   Currently in Pain? Yes   Pain Score 5    Pain Location Shoulder   Pain Orientation Left   Pain Descriptors / Indicators Aching   Pain Type Acute pain   Pain Onset More than a month ago            [facility] PT Assessment - 02/17/16 0001      Assessment   Medical Diagnosis Left shoulder pain.   Next MD Visit TBD     Precautions   Precautions Fall     Restrictions   Weight Bearing Restrictions No                     [facility] Adult PT Treatment/Exercise - 02/17/16 0001      Shoulder Exercises: Seated   Extension  Strengthening;Left;Theraband   Theraband Level (Shoulder Extension) Level 1 (Yellow)   Extension Limitations 3x10 reps   Retraction Strengthening;Left;Theraband  3x10 reps   Theraband Level (Shoulder Retraction) Level 1 (Yellow)   Horizontal ABduction Strengthening;Both;Theraband   Theraband Level (Shoulder Horizontal ABduction) Level 1 (Yellow)   Horizontal ABduction Limitations 3x10 reps   External Rotation Strengthening;Both;Theraband   Theraband Level (Shoulder External Rotation) Level 1 (Yellow)   External Rotation Limitations 3x10 reps   Internal Rotation Strengthening;Left;Theraband   Theraband Level (Shoulder Internal Rotation) Level 1 (Yellow)   Internal Rotation Limitations 3x10 reps     Shoulder Exercises: Pulleys   Flexion Other (comment)  x5 min   Other Pulley Exercises Seated LUE ranger into flex/ CW/ CCW circles x30 reps     Modalities   Modalities Electrical Stimulation;Moist Heat;Ultrasound     Moist Heat Therapy   Number Minutes Moist Heat 15 Minutes   Moist Heat Location Shoulder     Electrical Stimulation   Electrical Stimulation Location L shoulder   Electrical Stimulation Action IFC   Electrical Stimulation Parameters 1-10 hz x15 min   Electrical Stimulation Goals Pain  Ultrasound   Ultrasound Location L acromioclavicular/ posteriolateral shoulder   Ultrasound Parameters 1.2 w/cm2, 100%, 3.3 mhz x10 min   Ultrasound Goals Pain                  PT Short Term Goals - 02/04/16 1456      PT SHORT TERM GOAL #1   Title Ind with initial HEP.   Time 2   Period Weeks   Status New           PT Long Term Goals - 02/04/16 1456      PT LONG TERM GOAL #1   Title Ind with advanced HEP.   Time 8   Period Weeks   Status New     PT LONG TERM GOAL #2   Title Active left shoulder flexion to 135 degrees to assist with the accomplishment of functional activties.   Time 8   Period Weeks   Status New     PT LONG TERM GOAL #3   Title  Active left shoulder ER= 80 degrees.   Time 8   Period Weeks   Status New     PT LONG TERM GOAL #4   Title Left shoulder ER strength= 4+/5.   Time 8   Period Weeks   Status New     PT LONG TERM GOAL #5   Title Perform ADL's with left shoulder pain not > 2-3/10.   Time 8   Period Weeks   Status New     Additional Long Term Goals   Additional Long Term Goals Yes     PT LONG TERM GOAL #6   Title Eliminate referred pain symptoms.   Time 8   Period Weeks   Status New               Plan - 02/17/16 1103    Clinical Impression Statement Patient tolerated today's treatment well with mid level L acromioclavicular pain today. Patient experienced discomfort with L shoulder extension with yellow theraband per patient report. Patient reported some discomfort towards the end of the repititions with resisted horizontal abduction per patient report. Normal modalities response noted following removal of the modalities.    Rehab Potential Excellent   PT Frequency 2x / week   PT Duration 8 weeks   PT Treatment/Interventions ADLs/Self Care Home Management;Cryotherapy;Electrical Stimulation;Ultrasound;Moist Heat;Therapeutic activities;Therapeutic exercise;Patient/family education;Passive range of motion;Manual techniques;Dry needling   PT Next Visit Plan Continue with L shoulder ROM and strengthening exercises with modalities PRN per MPT POC.   PT Home Exercise Plan HEP- Home pulley per VCs   Consulted and Agree with Plan of Care Patient      Patient will benefit from skilled therapeutic intervention in order to improve the following deficits and impairments:  Pain, Decreased activity tolerance, Decreased range of motion, Decreased strength  Visit Diagnosis: Acute pain of left shoulder  Stiffness of left shoulder, not elsewhere classified  Muscle weakness (generalized)     Problem List Patient Active Problem List   Diagnosis Date Noted  . Spinal stenosis 09/02/2015  .  Numbness 09/02/2015  . Bradycardia 07/01/2014  . Thrombocytopenia ([place]) 06/30/2014  . CAP (community acquired pneumonia) 06/29/2014  . Hypoxia 06/29/2014  . HTN (hypertension) 06/29/2014  . Prostate cancer ([place]) 06/29/2014  . Hyponatremia 06/29/2014    Wynelle Fanny, PTA 02/17/2016, 11:51 AM  [facility] [address] [place], [place], [address] Phone: [phone]   Fax:  [phone]  Name: Thomas Vega MRN: [id number]  Date of Birth: [birth year]

## 2016-02-22 ENCOUNTER — Ambulatory Visit: Payer: Medicare Other | Admitting: Physical Therapy

## 2016-02-22 ENCOUNTER — Encounter: Payer: Self-pay | Admitting: Physical Therapy

## 2016-02-22 DIAGNOSIS — M25612 Stiffness of left shoulder, not elsewhere classified: Secondary | ICD-10-CM | POA: Diagnosis not present

## 2016-02-22 DIAGNOSIS — M6281 Muscle weakness (generalized): Secondary | ICD-10-CM | POA: Diagnosis not present

## 2016-02-22 DIAGNOSIS — M25512 Pain in left shoulder: Secondary | ICD-10-CM

## 2016-02-22 NOTE — Therapy (Signed)
[facility] [place], [place], [address] Phone: [phone]   Fax:  [phone]  Physical Therapy Treatment  Patient Details  Name: Thomas Vega MRN: [id number] Date of Birth: 1926-07-25 Referring Provider: Meredith Pel MD  Encounter Date: 02/22/2016      PT End of Session - 02/22/16 1109    Visit Number 6   Number of Visits 16   Date for PT Re-Evaluation 04/04/16   PT Start Time 1031   PT Stop Time 1129   PT Time Calculation (min) 58 min   Activity Tolerance Patient tolerated treatment well   Behavior During Therapy [facility] for tasks assessed/performed      Past Medical History:  Diagnosis Date  . Abnormal finding of blood chemistry   . Bradycardia 07/01/2014  . CAP (community acquired pneumonia) 06/29/2014  . Heart murmur   . Irregular heart rate   . Melanoma ([place])   . Prostate cancer ([place])   . Thrombocytopenia ([place]) 06/30/2014    Past Surgical History:  Procedure Laterality Date  . BACK SURGERY    . CATARACT EXTRACTION    . KNEE SURGERY    . MELANOMA EXCISION      There were no vitals filed for this visit.      Subjective Assessment - 02/22/16 1043    Subjective Patient reported feeling some ongoing soreness   Patient Stated Goals Use my left UE without pain.   Currently in Pain? Yes   Pain Score 4    Pain Location Shoulder   Pain Orientation Left   Pain Descriptors / Indicators Aching   Pain Type Acute pain   Pain Onset More than a month ago   Pain Frequency Intermittent   Aggravating Factors  prolong use   Pain Relieving Factors at rest            [facility] PT Assessment - 02/22/16 0001      ROM / Strength   AROM / PROM / Strength AROM;PROM     AROM   AROM Assessment Site Shoulder   Right/Left Shoulder Left   Left Shoulder Flexion 125 Degrees   Left Shoulder External Rotation 65 Degrees     PROM   PROM Assessment Site Shoulder   Right/Left Shoulder Left   Left Shoulder Flexion  135 Degrees   Left Shoulder External Rotation 70 Degrees                     [facility] Adult PT Treatment/Exercise - 02/22/16 0001      Shoulder Exercises: Seated   Extension Strengthening;Left;Theraband  3x10   Theraband Level (Shoulder Extension) Level 1 (Yellow)   Retraction Strengthening;Left;Theraband  3x10   Theraband Level (Shoulder Retraction) Level 1 (Yellow)   External Rotation Strengthening;Both;Theraband  3x10   Theraband Level (Shoulder External Rotation) Level 1 (Yellow)   Internal Rotation Strengthening;Left;Theraband  3x10   Theraband Level (Shoulder Internal Rotation) Level 1 (Yellow)     Shoulder Exercises: Pulleys   Flexion Other (comment)  65min   Other Pulley Exercises Seated LUE ranger into flex/ CW/ CCW circles x73min     Moist Heat Therapy   Number Minutes Moist Heat 15 Minutes   Moist Heat Location Shoulder     Electrical Stimulation   Electrical Stimulation Location L shoulder   Electrical Stimulation Action IFC   Electrical Stimulation Parameters 1-10hz  x77min   Electrical Stimulation Goals Pain     Ultrasound   Ultrasound Location left shoulder area of pain  Ultrasound Parameters 1.2 w/cm2/50%/3.39mhz x52min   Ultrasound Goals Pain     Manual Therapy   Manual Therapy Passive ROM   Passive ROM gentle PROM for left shoulder flexion and ER with low holds                  PT Short Term Goals - 02/22/16 1129      PT SHORT TERM GOAL #1   Title Ind with initial HEP.   Time 2   Period Weeks   Status On-going           PT Long Term Goals - 02/22/16 1049      PT LONG TERM GOAL #1   Title Ind with advanced HEP.   Time 8   Period Weeks   Status On-going     PT LONG TERM GOAL #2   Title Active left shoulder flexion to 135 degrees to assist with the accomplishment of functional activties.   Time 8   Period Weeks   Status On-going  AROM 125 degrees 02/22/16     PT LONG TERM GOAL #3   Title Active left shoulder  ER= 80 degrees.   Time 8   Period Weeks   Status On-going  AROM 65 degrees 02/22/16     PT LONG TERM GOAL #4   Title Left shoulder ER strength= 4+/5.   Time 8   Period Weeks   Status On-going     PT LONG TERM GOAL #5   Title Perform ADL's with left shoulder pain not > 2-3/10.   Time 8   Period Weeks   Status On-going     PT LONG TERM GOAL #6   Title Eliminate referred pain symptoms.   Time 8   Period Weeks   Status On-going               Plan - 02/22/16 1111    Clinical Impression Statement Patient progressing with all activities today. Patient has improved with left shoulder ROM and has less pain reported overall. Patient has ongoing pain when reaching overhead or when laying on left side when sleeping. Patient current goals progressing yet unable to meet due to pain, ROM and strength deficits.    Rehab Potential Excellent   PT Frequency 2x / week   PT Duration 8 weeks   PT Treatment/Interventions ADLs/Self Care Home Management;Cryotherapy;Electrical Stimulation;Ultrasound;Moist Heat;Therapeutic activities;Therapeutic exercise;Patient/family education;Passive range of motion;Manual techniques;Dry needling   PT Next Visit Plan Continue with L shoulder ROM and strengthening exercises with modalities PRN per MPT POC.   Consulted and Agree with Plan of Care Patient      Patient will benefit from skilled therapeutic intervention in order to improve the following deficits and impairments:  Pain, Decreased activity tolerance, Decreased range of motion, Decreased strength  Visit Diagnosis: Acute pain of left shoulder  Stiffness of left shoulder, not elsewhere classified  Muscle weakness (generalized)     Problem List Patient Active Problem List   Diagnosis Date Noted  . Spinal stenosis 09/02/2015  . Numbness 09/02/2015  . Bradycardia 07/01/2014  . Thrombocytopenia ([place]) 06/30/2014  . CAP (community acquired pneumonia) 06/29/2014  . Hypoxia 06/29/2014  . HTN  (hypertension) 06/29/2014  . Prostate cancer ([place]) 06/29/2014  . Hyponatremia 06/29/2014    Dontrel Smethers P, PTA 02/22/2016, 11:29 AM  [facility] [place], [place], [address] Phone: [phone]   Fax:  [phone]  Name: Thomas Vega MRN: [id number] Date of Birth: 1926/12/30

## 2016-02-24 ENCOUNTER — Ambulatory Visit: Payer: Medicare Other | Admitting: Physical Therapy

## 2016-02-24 ENCOUNTER — Encounter: Payer: Self-pay | Admitting: Physical Therapy

## 2016-02-24 DIAGNOSIS — M25512 Pain in left shoulder: Secondary | ICD-10-CM

## 2016-02-24 DIAGNOSIS — M6281 Muscle weakness (generalized): Secondary | ICD-10-CM | POA: Diagnosis not present

## 2016-02-24 DIAGNOSIS — M25612 Stiffness of left shoulder, not elsewhere classified: Secondary | ICD-10-CM | POA: Diagnosis not present

## 2016-02-24 NOTE — Therapy (Signed)
[facility] [place], [place], [address] Phone: [phone]   Fax:  [phone]  Physical Therapy Treatment  Patient Details  Name: Thomas Vega MRN: [id number] Date of Birth: 21-May-1926 Referring Provider: Meredith Pel MD  Encounter Date: 02/24/2016      PT End of Session - 02/24/16 1410    Visit Number 7   Number of Visits 16   Date for PT Re-Evaluation 04/04/16   PT Start Time 1400   PT Stop Time 1446   PT Time Calculation (min) 46 min   Activity Tolerance Patient tolerated treatment well   Behavior During Therapy [facility] for tasks assessed/performed      Past Medical History:  Diagnosis Date  . Abnormal finding of blood chemistry   . Bradycardia 07/01/2014  . CAP (community acquired pneumonia) 06/29/2014  . Heart murmur   . Irregular heart rate   . Melanoma ([place])   . Prostate cancer ([place])   . Thrombocytopenia ([place]) 06/30/2014    Past Surgical History:  Procedure Laterality Date  . BACK SURGERY    . CATARACT EXTRACTION    . KNEE SURGERY    . MELANOMA EXCISION      There were no vitals filed for this visit.      Subjective Assessment - 02/24/16 1403    Subjective no new complaints today just ongoing soreness per patient   Patient Stated Goals Use my left UE without pain.   Currently in Pain? Yes   Pain Score 3    Pain Location Shoulder   Pain Orientation Left   Pain Descriptors / Indicators Aching   Pain Type Acute pain   Pain Onset More than a month ago   Pain Frequency Intermittent   Aggravating Factors  increased use of UE   Pain Relieving Factors at rest                         [facility] Adult PT Treatment/Exercise - 02/24/16 0001      Shoulder Exercises: Seated   Extension Strengthening;Left;Theraband  3x10   Theraband Level (Shoulder Extension) Level 1 (Yellow)   Retraction Strengthening;Left;Theraband  3x10   Theraband Level (Shoulder Retraction) Level 1 (Yellow)   External Rotation Strengthening;Both;Theraband  3x10   Theraband Level (Shoulder External Rotation) Level 1 (Yellow)   Internal Rotation Strengthening;Left;Theraband  3x10   Theraband Level (Shoulder Internal Rotation) Level 1 (Yellow)     Shoulder Exercises: Pulleys   Flexion Other (comment)  23min   Other Pulley Exercises Seated LUE ranger into flex/ CW/ CCW circles x58min     Moist Heat Therapy   Number Minutes Moist Heat 15 Minutes   Moist Heat Location Shoulder     Electrical Stimulation   Electrical Stimulation Location L shoulder   Electrical Stimulation Action IFC   Electrical Stimulation Parameters 1-10hz  x87min   Electrical Stimulation Goals Pain     Ultrasound   Ultrasound Location left shoulder area of pain   Ultrasound Parameters 1.2w/cm2/50%/3.105mhz x27min   Ultrasound Goals Pain                  PT Short Term Goals - 02/22/16 1129      PT SHORT TERM GOAL #1   Title Ind with initial HEP.   Time 2   Period Weeks   Status On-going           PT Long Term Goals - 02/22/16 1049      PT  LONG TERM GOAL #1   Title Ind with advanced HEP.   Time 8   Period Weeks   Status On-going     PT LONG TERM GOAL #2   Title Active left shoulder flexion to 135 degrees to assist with the accomplishment of functional activties.   Time 8   Period Weeks   Status On-going  AROM 125 degrees 02/22/16     PT LONG TERM GOAL #3   Title Active left shoulder ER= 80 degrees.   Time 8   Period Weeks   Status On-going  AROM 65 degrees 02/22/16     PT LONG TERM GOAL #4   Title Left shoulder ER strength= 4+/5.   Time 8   Period Weeks   Status On-going     PT LONG TERM GOAL #5   Title Perform ADL's with left shoulder pain not > 2-3/10.   Time 8   Period Weeks   Status On-going     PT LONG TERM GOAL #6   Title Eliminate referred pain symptoms.   Time 8   Period Weeks   Status On-going               Plan - 02/24/16 1412    Clinical Impression  Statement Patient tolerated treatment well today and consistant with ROM. Patient continues to report progress yet has ongoing soreness. Some difficulty with reaching overhead due to pain limitations. Patient goals ongoing at this time due to pain deficts.   Rehab Potential Excellent   PT Frequency 2x / week   PT Duration 8 weeks   PT Treatment/Interventions ADLs/Self Care Home Management;Cryotherapy;Electrical Stimulation;Ultrasound;Moist Heat;Therapeutic activities;Therapeutic exercise;Patient/family education;Passive range of motion;Manual techniques;Dry needling   PT Next Visit Plan Continue with L shoulder ROM and strengthening exercises with modalities PRN per MPT POC.   Consulted and Agree with Plan of Care Patient      Patient will benefit from skilled therapeutic intervention in order to improve the following deficits and impairments:  Pain, Decreased activity tolerance, Decreased range of motion, Decreased strength  Visit Diagnosis: Acute pain of left shoulder  Stiffness of left shoulder, not elsewhere classified  Muscle weakness (generalized)     Problem List Patient Active Problem List   Diagnosis Date Noted  . Spinal stenosis 09/02/2015  . Numbness 09/02/2015  . Bradycardia 07/01/2014  . Thrombocytopenia ([place]) 06/30/2014  . CAP (community acquired pneumonia) 06/29/2014  . Hypoxia 06/29/2014  . HTN (hypertension) 06/29/2014  . Prostate cancer ([place]) 06/29/2014  . Hyponatremia 06/29/2014    Thomas Vega P, PTA 02/24/2016, 2:46 PM  [facility] [place], [place], [address] Phone: [phone]   Fax:  [phone]  Name: Thomas Vega MRN: [id number] Date of Birth: Jan 02, 1927

## 2016-03-07 ENCOUNTER — Encounter: Payer: Self-pay | Admitting: Physical Therapy

## 2016-03-07 ENCOUNTER — Ambulatory Visit: Payer: Medicare Other | Attending: Orthopedic Surgery | Admitting: Physical Therapy

## 2016-03-07 DIAGNOSIS — M25612 Stiffness of left shoulder, not elsewhere classified: Secondary | ICD-10-CM | POA: Insufficient documentation

## 2016-03-07 DIAGNOSIS — M6281 Muscle weakness (generalized): Secondary | ICD-10-CM | POA: Insufficient documentation

## 2016-03-07 DIAGNOSIS — M25512 Pain in left shoulder: Secondary | ICD-10-CM | POA: Insufficient documentation

## 2016-03-07 NOTE — Therapy (Signed)
[facility] [place], [place], [address] Phone: [phone]   Fax:  [phone]  Physical Therapy Treatment  Patient Details  Name: Thomas Vega MRN: [id number] Date of Birth: 09/30/1926 Referring Provider: Meredith Pel MD  Encounter Date: 03/07/2016      PT End of Session - 03/07/16 1053    Visit Number 8   Number of Visits 16   Date for PT Re-Evaluation 04/04/16   PT Start Time 1031   PT Stop Time 1118   PT Time Calculation (min) 47 min   Activity Tolerance Patient tolerated treatment well   Behavior During Therapy [facility] for tasks assessed/performed      Past Medical History:  Diagnosis Date  . Abnormal finding of blood chemistry   . Bradycardia 07/01/2014  . CAP (community acquired pneumonia) 06/29/2014  . Heart murmur   . Irregular heart rate   . Melanoma ([place])   . Prostate cancer ([place])   . Thrombocytopenia ([place]) 06/30/2014    Past Surgical History:  Procedure Laterality Date  . BACK SURGERY    . CATARACT EXTRACTION    . KNEE SURGERY    . MELANOMA EXCISION      There were no vitals filed for this visit.      Subjective Assessment - 03/07/16 1037    Subjective no new complaints today just ongoing soreness per patient   Patient Stated Goals Use my left UE without pain.   Currently in Pain? Yes   Pain Score 3    Pain Location Shoulder   Pain Orientation Left   Pain Descriptors / Indicators Aching   Pain Type Acute pain   Pain Onset More than a month ago   Pain Frequency Intermittent   Aggravating Factors  prolong use of shouler esp reaching overhead   Pain Relieving Factors at rest            [facility] PT Assessment - 03/07/16 0001      AROM   AROM Assessment Site Shoulder   Right/Left Shoulder Left   Left Shoulder Flexion 125 Degrees                     [facility] Adult PT Treatment/Exercise - 03/07/16 0001      Shoulder Exercises: Seated   Extension  Strengthening;Left;Theraband  3x10   Theraband Level (Shoulder Extension) Level 1 (Yellow)   Retraction Strengthening;Left;Theraband  3x10   Theraband Level (Shoulder Retraction) Level 1 (Yellow)   External Rotation Strengthening;Both;Theraband  3x10   Theraband Level (Shoulder External Rotation) Level 1 (Yellow)   Internal Rotation Strengthening;Left;Theraband   Theraband Level (Shoulder Internal Rotation) Level 1 (Yellow)  3x10     Shoulder Exercises: Pulleys   Flexion Other (comment)  26min   Other Pulley Exercises Seated LUE ranger into flex/ CW/ CCW circles x51min     Moist Heat Therapy   Number Minutes Moist Heat 15 Minutes   Moist Heat Location Shoulder     Electrical Stimulation   Electrical Stimulation Location L shoulder   Electrical Stimulation Action IFC   Electrical Stimulation Parameters 1-10hz  x80min   Electrical Stimulation Goals Pain     Ultrasound   Ultrasound Location left post shoulder   Ultrasound Parameters 1.2w/cm2/50%/3.52mhz x75min   Ultrasound Goals Pain                  PT Short Term Goals - 02/22/16 1129      PT SHORT TERM GOAL #1   Title  Ind with initial HEP.   Time 2   Period Weeks   Status On-going           PT Long Term Goals - 02/22/16 1049      PT LONG TERM GOAL #1   Title Ind with advanced HEP.   Time 8   Period Weeks   Status On-going     PT LONG TERM GOAL #2   Title Active left shoulder flexion to 135 degrees to assist with the accomplishment of functional activties.   Time 8   Period Weeks   Status On-going  AROM 125 degrees 02/22/16     PT LONG TERM GOAL #3   Title Active left shoulder ER= 80 degrees.   Time 8   Period Weeks   Status On-going  AROM 65 degrees 02/22/16     PT LONG TERM GOAL #4   Title Left shoulder ER strength= 4+/5.   Time 8   Period Weeks   Status On-going     PT LONG TERM GOAL #5   Title Perform ADL's with left shoulder pain not > 2-3/10.   Time 8   Period Weeks   Status  On-going     PT LONG TERM GOAL #6   Title Eliminate referred pain symptoms.   Time 8   Period Weeks   Status On-going               Plan - 03/07/16 1058    Clinical Impression Statement Patient tolerated treatment well today. Patient has not improved with left shouler ROM this week. Patient was not able to do as much exercise at home last week. Educated patient on daily self ROM to increase ROM and improve functional independence. Patient goals ongoing due to strength and ROM deficits.   Rehab Potential Excellent   PT Frequency 2x / week   PT Duration 8 weeks   PT Treatment/Interventions ADLs/Self Care Home Management;Cryotherapy;Electrical Stimulation;Ultrasound;Moist Heat;Therapeutic activities;Therapeutic exercise;Patient/family education;Passive range of motion;Manual techniques;Dry needling   PT Next Visit Plan Continue with L shoulder ROM and strengthening exercises with modalities PRN per MPT POC.   Consulted and Agree with Plan of Care Patient      Patient will benefit from skilled therapeutic intervention in order to improve the following deficits and impairments:  Pain, Decreased activity tolerance, Decreased range of motion, Decreased strength  Visit Diagnosis: Acute pain of left shoulder  Stiffness of left shoulder, not elsewhere classified  Muscle weakness (generalized)     Problem List Patient Active Problem List   Diagnosis Date Noted  . Spinal stenosis 09/02/2015  . Numbness 09/02/2015  . Bradycardia 07/01/2014  . Thrombocytopenia ([place]) 06/30/2014  . CAP (community acquired pneumonia) 06/29/2014  . Hypoxia 06/29/2014  . HTN (hypertension) 06/29/2014  . Prostate cancer ([place]) 06/29/2014  . Hyponatremia 06/29/2014    Thomas Vega P, PTA 03/07/2016, 11:20 AM  [facility] [place], [place], [address] Phone: [phone]   Fax:  [phone]  Name: Thomas Vega MRN: [id number] Date  of Birth: [birth year]

## 2016-03-08 ENCOUNTER — Encounter: Payer: Medicare Other | Admitting: Physical Therapy

## 2016-03-09 ENCOUNTER — Encounter: Payer: Self-pay | Admitting: Physical Therapy

## 2016-03-09 ENCOUNTER — Ambulatory Visit: Payer: Medicare Other | Admitting: Physical Therapy

## 2016-03-09 DIAGNOSIS — M25512 Pain in left shoulder: Secondary | ICD-10-CM | POA: Diagnosis not present

## 2016-03-09 DIAGNOSIS — M6281 Muscle weakness (generalized): Secondary | ICD-10-CM | POA: Diagnosis not present

## 2016-03-09 DIAGNOSIS — M25612 Stiffness of left shoulder, not elsewhere classified: Secondary | ICD-10-CM

## 2016-03-09 NOTE — Therapy (Signed)
[facility] [place], [place], [address] Phone: [phone]   Fax:  [phone]  Physical Therapy Treatment  Patient Details  Name: Thomas Vega MRN: [id number] Date of Birth: January 17, 1927 Referring Provider: Meredith Pel MD  Encounter Date: 03/09/2016      PT End of Session - 03/09/16 1032    Visit Number 9   Number of Visits 16   Date for PT Re-Evaluation 04/04/16   PT Start Time 1038   PT Stop Time 1127   PT Time Calculation (min) 49 min   Activity Tolerance Patient tolerated treatment well   Behavior During Therapy [facility] for tasks assessed/performed      Past Medical History:  Diagnosis Date  . Abnormal finding of blood chemistry   . Bradycardia 07/01/2014  . CAP (community acquired pneumonia) 06/29/2014  . Heart murmur   . Irregular heart rate   . Melanoma ([place])   . Prostate cancer ([place])   . Thrombocytopenia ([place]) 06/30/2014    Past Surgical History:  Procedure Laterality Date  . BACK SURGERY    . CATARACT EXTRACTION    . KNEE SURGERY    . MELANOMA EXCISION      There were no vitals filed for this visit.      Subjective Assessment - 03/09/16 1032    Subjective Reports soreness from previous treatment this week.   Patient Stated Goals Use my left UE without pain.   Currently in Pain? Yes   Pain Score 4    Pain Location Shoulder   Pain Orientation Left   Pain Descriptors / Indicators Sore   Pain Type Acute pain   Pain Onset More than a month ago   Pain Frequency Intermittent  with movement            [facility] PT Assessment - 03/09/16 0001      Assessment   Medical Diagnosis Left shoulder pain.   Next MD Visit TBD     Restrictions   Weight Bearing Restrictions No     ROM / Strength   AROM / PROM / Strength AROM     AROM   Overall AROM  Deficits   AROM Assessment Site Shoulder   Right/Left Shoulder Left   Left Shoulder Flexion 126 Degrees                     [facility]  Adult PT Treatment/Exercise - 03/09/16 0001      Shoulder Exercises: Standing   Protraction Strengthening;Right;20 reps;Theraband   Theraband Level (Shoulder Protraction) Level 1 (Yellow)   External Rotation Strengthening;Right;20 reps;Theraband   Theraband Level (Shoulder External Rotation) Level 1 (Yellow)   Internal Rotation Strengthening;Right;20 reps;Theraband   Theraband Level (Shoulder Internal Rotation) Level 1 (Yellow)   Extension Strengthening;Right;Theraband   Theraband Level (Shoulder Extension) Level 1 (Yellow)   Extension Limitations 3x10 reps   Row Strengthening;Right;20 reps;Theraband   Theraband Level (Shoulder Row) Level 1 (Yellow)   Other Standing Exercises R shoulder wall slide with forearm supination x20 reps     Shoulder Exercises: Pulleys   Flexion 3 minutes   Other Pulley Exercises Seated LUE ranger into flex/ CW/ CCW circles x41mn     Modalities   Modalities Electrical Stimulation;Moist Heat;Ultrasound     Moist Heat Therapy   Number Minutes Moist Heat 15 Minutes   Moist Heat Location Shoulder     Electrical Stimulation   Electrical Stimulation Location L shoulder   Electrical Stimulation Action IFC   Electrical  Stimulation Parameters 1-10 hz x15 min   Electrical Stimulation Goals Pain     Ultrasound   Ultrasound Location L posteriolateral shoulder   Ultrasound Parameters 1.5 w/cm2, 100%, 1 mhz x10 min   Ultrasound Goals Pain                  PT Short Term Goals - 03/09/16 1047      PT SHORT TERM GOAL #1   Title Ind with initial HEP.   Time 2   Period Weeks   Status Achieved           PT Long Term Goals - 03/09/16 1048      PT LONG TERM GOAL #1   Title Ind with advanced HEP.   Time 8   Period Weeks   Status On-going     PT LONG TERM GOAL #2   Title Active left shoulder flexion to 135 degrees to assist with the accomplishment of functional activties.   Time 8   Period Weeks   Status On-going  AROM 125 degrees 02/22/16      PT LONG TERM GOAL #3   Title Active left shoulder ER= 80 degrees.   Time 8   Period Weeks   Status On-going  AROM 65 degrees 02/22/16     PT LONG TERM GOAL #4   Title Left shoulder ER strength= 4+/5.   Time 8   Period Weeks   Status On-going     PT LONG TERM GOAL #5   Title Perform ADL's with left shoulder pain not > 2-3/10.   Time 8   Period Weeks   Status Achieved     PT LONG TERM GOAL #6   Title Eliminate referred pain symptoms.   Time 8   Period Weeks   Status Partially Met  No pain down LUE into elbow or hand but intermittant pain at L clavicle region and at deltoid attachment 03/09/2016               Plan - 03/09/16 1148    Clinical Impression Statement Patient tolerated today's treatment well although he arrived with L shoulder soreness to which he contributed to greater repititions in previous treatment. Patient able to complete resisted strengthening exercises well with report of less discomfort with extension than with any of the other exercises. Patient introduced to standing L shoulder wall slides to improve flexion ROM to goal status. Normal modalities response noted following removal of the modalities. Patient able to report improvement in regards to L shoulder although he has not yet been able to achieve ROM and strength goals.   Rehab Potential Excellent   PT Frequency 2x / week   PT Duration 8 weeks   PT Treatment/Interventions ADLs/Self Care Home Management;Cryotherapy;Electrical Stimulation;Ultrasound;Moist Heat;Therapeutic activities;Therapeutic exercise;Patient/family education;Passive range of motion;Manual techniques;Dry needling   PT Next Visit Plan Continue with L shoulder ROM and strengthening exercises with modalities PRN per MPT POC. GCODE NEXT TREATMENT.   PT Home Exercise Plan HEP- Home pulley per VCs   Consulted and Agree with Plan of Care Patient      Patient will benefit from skilled therapeutic intervention in order to improve the  following deficits and impairments:  Pain, Decreased activity tolerance, Decreased range of motion, Decreased strength  Visit Diagnosis: Acute pain of left shoulder  Stiffness of left shoulder, not elsewhere classified  Muscle weakness (generalized)     Problem List Patient Active Problem List   Diagnosis Date Noted  . Spinal stenosis 09/02/2015  .  Numbness 09/02/2015  . Bradycardia 07/01/2014  . Thrombocytopenia ([place]) 06/30/2014  . CAP (community acquired pneumonia) 06/29/2014  . Hypoxia 06/29/2014  . HTN (hypertension) 06/29/2014  . Prostate cancer ([place]) 06/29/2014  . Hyponatremia 06/29/2014    Wynelle Fanny, PTA 03/09/2016, 11:52 AM  [facility] [address] [place], [place], [address] Phone: [phone]   Fax:  [phone]  Name: Thomas Vega MRN: [id number] Date of Birth: 25-Jan-1927

## 2016-03-15 ENCOUNTER — Ambulatory Visit: Payer: Medicare Other | Admitting: Physical Therapy

## 2016-03-15 DIAGNOSIS — M25512 Pain in left shoulder: Secondary | ICD-10-CM

## 2016-03-15 DIAGNOSIS — M25612 Stiffness of left shoulder, not elsewhere classified: Secondary | ICD-10-CM

## 2016-03-15 DIAGNOSIS — M6281 Muscle weakness (generalized): Secondary | ICD-10-CM | POA: Diagnosis not present

## 2016-03-15 NOTE — Therapy (Signed)
[facility] [place], [place], [address] Phone: [phone]   Fax:  [phone]  Physical Therapy Treatment  Patient Details  Name: Thomas Vega MRN: [id number] Date of Birth: Nov 17, 1926 Referring Provider: Meredith Pel MD  Encounter Date: 03/15/2016      PT End of Session - 03/15/16 1120    Visit Number 10   Number of Visits 16   Date for PT Re-Evaluation 04/04/16      Past Medical History:  Diagnosis Date  . Abnormal finding of blood chemistry   . Bradycardia 07/01/2014  . CAP (community acquired pneumonia) 06/29/2014  . Heart murmur   . Irregular heart rate   . Melanoma ([place])   . Prostate cancer ([place])   . Thrombocytopenia ([place]) 06/30/2014    Past Surgical History:  Procedure Laterality Date  . BACK SURGERY    . CATARACT EXTRACTION    . KNEE SURGERY    . MELANOMA EXCISION      There were no vitals filed for this visit.      Subjective Assessment - 03/15/16 1037    Subjective I slept on my left shoulder last night so it's a little sore.   Pain Score 4    Pain Location Shoulder   Pain Orientation Left   Pain Descriptors / Indicators Sore   Pain Type Acute pain   Pain Onset More than a month ago                         [facility] Adult PT Treatment/Exercise - 03/15/16 0001      Exercises   Exercises Shoulder     Shoulder Exercises: Seated   Other Seated Exercises UE Ranger seated x 5 minutes.     Shoulder Exercises: Pulleys   Flexion Limitations 5 minutes.     Shoulder Exercises: ROM/Strengthening   UBE (Upper Arm Bike) x 6 minutes.   Other ROM/Strengthening Exercises Red theraband left shoulder ER to fatigue x 2.     Modalities   Modalities Electrical Stimulation;Moist Heat     Moist Heat Therapy   Number Minutes Moist Heat 15 Minutes   Moist Heat Location Shoulder  Left shoulder.     Acupuncturist Location Left shoulder.   Electrical Stimulation Action IFC   Electrical Stimulation Parameters 80-150 HZ on 100% scan x 15 minutes.   Electrical Stimulation Goals Pain     Ultrasound   Ultrasound Location Left ant/post left shoulder.   Ultrasound Parameters Combo e'stim/U/S at 1.50 W/CM2 x 7 minutes.   Ultrasound Goals Pain                  PT Short Term Goals - 03/09/16 1047      PT SHORT TERM GOAL #1   Title Ind with initial HEP.   Time 2   Period Weeks   Status Achieved           PT Long Term Goals - 03/09/16 1048      PT LONG TERM GOAL #1   Title Ind with advanced HEP.   Time 8   Period Weeks   Status On-going     PT LONG TERM GOAL #2   Title Active left shoulder flexion to 135 degrees to assist with the accomplishment of functional activties.   Time 8   Period Weeks   Status On-going  AROM 125 degrees 02/22/16     PT LONG TERM GOAL #  3   Title Active left shoulder ER= 80 degrees.   Time 8   Period Weeks   Status On-going  AROM 65 degrees 02/22/16     PT LONG TERM GOAL #4   Title Left shoulder ER strength= 4+/5.   Time 8   Period Weeks   Status On-going     PT LONG TERM GOAL #5   Title Perform ADL's with left shoulder pain not > 2-3/10.   Time 8   Period Weeks   Status Achieved     PT LONG TERM GOAL #6   Title Eliminate referred pain symptoms.   Time 8   Period Weeks   Status Partially Met  No pain down LUE into elbow or hand but intermittant pain at L clavicle region and at deltoid attachment 03/09/2016             Patient will benefit from skilled therapeutic intervention in order to improve the following deficits and impairments:     Visit Diagnosis: Acute pain of left shoulder  Stiffness of left shoulder, not elsewhere classified       G-Codes - Mar 19, 2016 1120    Functional Assessment Tool Used Clinical judgement...10th visit.   Functional Limitation Self care   Self Care Current Status [id number]) At least 20 percent but less than 40 percent  impaired, limited or restricted   Self Care Goal Status (X8338) At least 1 percent but less than 20 percent impaired, limited or restricted      Problem List Patient Active Problem List   Diagnosis Date Noted  . Spinal stenosis 09/02/2015  . Numbness 09/02/2015  . Bradycardia 07/01/2014  . Thrombocytopenia ([place]) 06/30/2014  . CAP (community acquired pneumonia) 06/29/2014  . Hypoxia 06/29/2014  . HTN (hypertension) 06/29/2014  . Prostate cancer ([place]) 06/29/2014  . Hyponatremia 06/29/2014    Cordaro Mukai, [place] MPT March 19, 2016, 12:07 PM  [facility] [place], [place], [address] Phone: [phone]   Fax:  [phone]  Name: Thomas Vega MRN: [id number] Date of Birth: [birth year]

## 2016-03-16 DIAGNOSIS — L814 Other melanin hyperpigmentation: Secondary | ICD-10-CM | POA: Diagnosis not present

## 2016-03-16 DIAGNOSIS — Z85820 Personal history of malignant melanoma of skin: Secondary | ICD-10-CM | POA: Diagnosis not present

## 2016-03-16 DIAGNOSIS — L821 Other seborrheic keratosis: Secondary | ICD-10-CM | POA: Diagnosis not present

## 2016-03-16 DIAGNOSIS — L570 Actinic keratosis: Secondary | ICD-10-CM | POA: Diagnosis not present

## 2016-03-16 DIAGNOSIS — D235 Other benign neoplasm of skin of trunk: Secondary | ICD-10-CM | POA: Diagnosis not present

## 2016-03-17 ENCOUNTER — Encounter: Payer: Self-pay | Admitting: Physical Therapy

## 2016-03-17 ENCOUNTER — Ambulatory Visit: Payer: Medicare Other | Admitting: Physical Therapy

## 2016-03-17 DIAGNOSIS — M25612 Stiffness of left shoulder, not elsewhere classified: Secondary | ICD-10-CM

## 2016-03-17 DIAGNOSIS — M25512 Pain in left shoulder: Secondary | ICD-10-CM | POA: Diagnosis not present

## 2016-03-17 DIAGNOSIS — M6281 Muscle weakness (generalized): Secondary | ICD-10-CM

## 2016-03-17 NOTE — Therapy (Signed)
[facility] [place], [place], [address] Phone: [phone]   Fax:  [phone]  Physical Therapy Treatment  Patient Details  Name: Thomas Vega MRN: [id number] Date of Birth: [birth year] Referring Provider: Meredith Pel MD  Encounter Date: 03/17/2016      PT End of Session - 03/17/16 1035    Visit Number 11   Number of Visits 16   Date for PT Re-Evaluation 04/04/16   PT Start Time 1033   PT Stop Time 1115   PT Time Calculation (min) 42 min   Activity Tolerance Patient tolerated treatment well   Behavior During Therapy [facility] for tasks assessed/performed      Past Medical History:  Diagnosis Date  . Abnormal finding of blood chemistry   . Bradycardia 07/01/2014  . CAP (community acquired pneumonia) 06/29/2014  . Heart murmur   . Irregular heart rate   . Melanoma ([place])   . Prostate cancer ([place])   . Thrombocytopenia ([place]) 06/30/2014    Past Surgical History:  Procedure Laterality Date  . BACK SURGERY    . CATARACT EXTRACTION    . KNEE SURGERY    . MELANOMA EXCISION      There were no vitals filed for this visit.      Subjective Assessment - 03/17/16 1034    Subjective Reports that his shoulder is hurting a bit today. Reports that reaching out is more painful that rotation exercises. Reports that he does see improvement with L shoulder as it used to her as a toothache.   Patient Stated Goals Use my left UE without pain.   Currently in Pain? Yes   Pain Score 4    Pain Location Shoulder   Pain Orientation Left   Pain Descriptors / Indicators Aching   Pain Type Acute pain   Pain Onset More than a month ago   Aggravating Factors  With bed mobility intermittantly or pull shirt down in the back.   Pain Relieving Factors Rest            [facility] PT Assessment - 03/17/16 0001      Assessment   Medical Diagnosis Left shoulder pain.   Next MD Visit TBD     Restrictions   Weight Bearing Restrictions No                      [facility] Adult PT Treatment/Exercise - 03/17/16 0001      Shoulder Exercises: Standing   Protraction Strengthening;Right;20 reps;Theraband   Theraband Level (Shoulder Protraction) Level 1 (Yellow)   Horizontal ABduction Strengthening;Both;20 reps;Theraband   Theraband Level (Shoulder Horizontal ABduction) Level 1 (Yellow)   External Rotation Strengthening;Right;20 reps;Theraband   Theraband Level (Shoulder External Rotation) Level 1 (Yellow)   Internal Rotation Strengthening;Right;20 reps;Theraband   Theraband Level (Shoulder Internal Rotation) Level 1 (Yellow)   Extension Strengthening;Left;20 reps;Theraband   Theraband Level (Shoulder Extension) Level 1 (Yellow)   Row Strengthening;Right;20 reps;Theraband   Theraband Level (Shoulder Row) Level 1 (Yellow)   Other Standing Exercises L shoulder D1 x10 reps with yellow theraband but stopped secondary to pain     Shoulder Exercises: Pulleys   Flexion 3 minutes  Reported L elbow discomfort   Other Pulley Exercises Standing LUE ranger flex/ CW and CCW circles x20 reps each     Modalities   Modalities Electrical Stimulation;Moist Heat;Ultrasound     Moist Heat Therapy   Number Minutes Moist Heat 15 Minutes   Moist Heat Location Shoulder  Acupuncturist Location L shoulder   Electrical Stimulation Action IFC   Electrical Stimulation Parameters 1-10 hz x15 min   Electrical Stimulation Goals Pain     Ultrasound   Ultrasound Location L AC Joint/ Posterior shoulder   Ultrasound Parameters 1.5 w/cm2, 100%, 1 mhz x10 min   Ultrasound Goals Pain                  PT Short Term Goals - 03/09/16 1047      PT SHORT TERM GOAL #1   Title Ind with initial HEP.   Time 2   Period Weeks   Status Achieved           PT Long Term Goals - 03/09/16 1048      PT LONG TERM GOAL #1   Title Ind with advanced HEP.   Time 8   Period Weeks   Status On-going      PT LONG TERM GOAL #2   Title Active left shoulder flexion to 135 degrees to assist with the accomplishment of functional activties.   Time 8   Period Weeks   Status On-going  AROM 125 degrees 02/22/16     PT LONG TERM GOAL #3   Title Active left shoulder ER= 80 degrees.   Time 8   Period Weeks   Status On-going  AROM 65 degrees 02/22/16     PT LONG TERM GOAL #4   Title Left shoulder ER strength= 4+/5.   Time 8   Period Weeks   Status On-going     PT LONG TERM GOAL #5   Title Perform ADL's with left shoulder pain not > 2-3/10.   Time 8   Period Weeks   Status Achieved     PT LONG TERM GOAL #6   Title Eliminate referred pain symptoms.   Time 8   Period Weeks   Status Partially Met  No pain down LUE into elbow or hand but intermittant pain at L clavicle region and at deltoid attachment 03/09/2016               Plan - 03/17/16 1104    Clinical Impression Statement Patient tolerated today's treatment fairly well although he arrived to clinic with reports of 3-4/10 L shoulder ache. Patient able to tolerate continued gentle strengthening with reports of discomfort with protraction but not with rotation exercises. Pain also reported with L shoulder D1 resisted with yellow theraband thus exercise discontinued. Normal modalities response noted following removal of the modalities.   Rehab Potential Excellent   PT Frequency 2x / week   PT Duration 8 weeks   PT Treatment/Interventions ADLs/Self Care Home Management;Cryotherapy;Electrical Stimulation;Ultrasound;Moist Heat;Therapeutic activities;Therapeutic exercise;Patient/family education;Passive range of motion;Manual techniques;Dry needling   PT Next Visit Plan Continue with L shoulder ROM and strengthening exercises with modalities PRN per MPT POC. GCODE NEXT TREATMENT.   PT Home Exercise Plan HEP- Home pulley per VCs   Consulted and Agree with Plan of Care Patient      Patient will benefit from skilled therapeutic  intervention in order to improve the following deficits and impairments:  Pain, Decreased activity tolerance, Decreased range of motion, Decreased strength  Visit Diagnosis: Stiffness of left shoulder, not elsewhere classified  Acute pain of left shoulder  Muscle weakness (generalized)     Problem List Patient Active Problem List   Diagnosis Date Noted  . Spinal stenosis 09/02/2015  . Numbness 09/02/2015  . Bradycardia 07/01/2014  . Thrombocytopenia ([place]) 06/30/2014  .  CAP (community acquired pneumonia) 06/29/2014  . Hypoxia 06/29/2014  . HTN (hypertension) 06/29/2014  . Prostate cancer ([place]) 06/29/2014  . Hyponatremia 06/29/2014    Wynelle Fanny, PTA 03/17/2016, 11:32 AM  [facility] [address] [place], [place], [address] Phone: [phone]   Fax:  [phone]  Name: Thomas Vega MRN: [id number] Date of Birth: [birth year]

## 2016-03-24 ENCOUNTER — Encounter: Payer: Self-pay | Admitting: Physical Therapy

## 2016-03-24 ENCOUNTER — Ambulatory Visit: Payer: Medicare Other | Admitting: Physical Therapy

## 2016-03-24 DIAGNOSIS — M25512 Pain in left shoulder: Secondary | ICD-10-CM | POA: Diagnosis not present

## 2016-03-24 DIAGNOSIS — M6281 Muscle weakness (generalized): Secondary | ICD-10-CM | POA: Diagnosis not present

## 2016-03-24 DIAGNOSIS — M25612 Stiffness of left shoulder, not elsewhere classified: Secondary | ICD-10-CM | POA: Diagnosis not present

## 2016-03-24 NOTE — Therapy (Signed)
[facility] [place], [place], [address] Phone: [phone]   Fax:  [phone]  Physical Therapy Treatment  Patient Details  Name: Thomas Vega MRN: [id number] Date of Birth: 1927/01/24 Referring Provider: Meredith Pel MD  Encounter Date: 03/24/2016      PT End of Session - 03/24/16 1033    Visit Number 12   Number of Visits 16   Date for PT Re-Evaluation 04/04/16   PT Start Time 9379   PT Stop Time 1122   PT Time Calculation (min) 53 min   Activity Tolerance Patient tolerated treatment well   Behavior During Therapy [facility] for tasks assessed/performed      Past Medical History:  Diagnosis Date  . Abnormal finding of blood chemistry   . Bradycardia 07/01/2014  . CAP (community acquired pneumonia) 06/29/2014  . Heart murmur   . Irregular heart rate   . Melanoma ([place])   . Prostate cancer ([place])   . Thrombocytopenia ([place]) 06/30/2014    Past Surgical History:  Procedure Laterality Date  . BACK SURGERY    . CATARACT EXTRACTION    . KNEE SURGERY    . MELANOMA EXCISION      There were no vitals filed for this visit.      Subjective Assessment - 03/24/16 1032    Subjective Reports that his shoulder is alright today.   Patient Stated Goals Use my left UE without pain.   Currently in Pain? Yes   Pain Score 2    Pain Location Shoulder   Pain Orientation Left   Pain Descriptors / Indicators Nagging   Pain Type Acute pain   Pain Onset More than a month ago            [facility] PT Assessment - 03/24/16 0001      Assessment   Medical Diagnosis Left shoulder pain.   Next MD Visit TBD     Restrictions   Weight Bearing Restrictions No                     [facility] Adult PT Treatment/Exercise - 03/24/16 0001      Shoulder Exercises: Seated   Horizontal ABduction Strengthening;Both;Theraband   Theraband Level (Shoulder Horizontal ABduction) Level 3 (Green)   Horizontal ABduction Limitations  3x10 reps     Shoulder Exercises: Standing   Protraction Strengthening;Left;Theraband   Theraband Level (Shoulder Protraction) Level 3 (Green)   Protraction Limitations 3x10 reps   Horizontal ABduction --   Theraband Level (Shoulder Horizontal ABduction) --   Horizontal ABduction Limitations --   External Rotation Strengthening;Left;Theraband  3x10 reps   Theraband Level (Shoulder External Rotation) Level 3 (Green)   Internal Rotation Strengthening;Left;Theraband   Theraband Level (Shoulder Internal Rotation) Level 3 (Green)   Internal Rotation Limitations 3x10 reps   Extension Strengthening;Both   Extension Limitations 3x10 reps with pink Merchant navy officer;Both   Row Limitations 3x10 reps with pink XTS     Shoulder Exercises: Pulleys   Flexion Other (comment)  x5 min   Other Pulley Exercises Standing LUE ranger flex/ CW and CCW circles x20 reps each     Shoulder Exercises: ROM/Strengthening   UBE (Upper Arm Bike) 90 RPM x 5 min     Modalities   Modalities Electrical Stimulation;Moist Heat;Ultrasound     Moist Heat Therapy   Number Minutes Moist Heat 15 Minutes   Moist Heat Location Shoulder     Electrical Stimulation   Electrical Stimulation Location  L shoulder   Electrical Stimulation Action IFC   Electrical Stimulation Parameters 1-10 hz x15 min   Electrical Stimulation Goals Pain     Ultrasound   Ultrasound Location L AC joint / posterior shoulder   Ultrasound Parameters 1.5 w/cm2 ,100%, 1 mhz x10 min   Ultrasound Goals Pain                  PT Short Term Goals - 03/09/16 1047      PT SHORT TERM GOAL #1   Title Ind with initial HEP.   Time 2   Period Weeks   Status Achieved           PT Long Term Goals - 03/09/16 1048      PT LONG TERM GOAL #1   Title Ind with advanced HEP.   Time 8   Period Weeks   Status On-going     PT LONG TERM GOAL #2   Title Active left shoulder flexion to 135 degrees to assist with the accomplishment of  functional activties.   Time 8   Period Weeks   Status On-going  AROM 125 degrees 02/22/16     PT LONG TERM GOAL #3   Title Active left shoulder ER= 80 degrees.   Time 8   Period Weeks   Status On-going  AROM 65 degrees 02/22/16     PT LONG TERM GOAL #4   Title Left shoulder ER strength= 4+/5.   Time 8   Period Weeks   Status On-going     PT LONG TERM GOAL #5   Title Perform ADL's with left shoulder pain not > 2-3/10.   Time 8   Period Weeks   Status Achieved     PT LONG TERM GOAL #6   Title Eliminate referred pain symptoms.   Time 8   Period Weeks   Status Partially Met  No pain down LUE into elbow or hand but intermittant pain at L clavicle region and at deltoid attachment 03/09/2016               Plan - 03/24/16 1108    Clinical Impression Statement Patient arrived today to clinic with only minimal L shoulder discomfort which he described as nagging pain. Patient able to complete all exercises as directed with patient reporting no discomfort following UBE and difficulty with L shoulder ER with green theraband. Patient required close supervision secondary to instability intermittantly and imbalance with standing. Normal modalities response noted following removal of the modalities.   Rehab Potential Excellent   PT Frequency 2x / week   PT Duration 8 weeks   PT Treatment/Interventions ADLs/Self Care Home Management;Cryotherapy;Electrical Stimulation;Ultrasound;Moist Heat;Therapeutic activities;Therapeutic exercise;Patient/family education;Passive range of motion;Manual techniques;Dry needling   PT Next Visit Plan Continue with L shoulder ROM and strengthening exercises with modalities PRN per MPT POC. GCODE NEXT TREATMENT.   PT Home Exercise Plan HEP- Home pulley per VCs   Consulted and Agree with Plan of Care Patient      Patient will benefit from skilled therapeutic intervention in order to improve the following deficits and impairments:  Pain, Decreased  activity tolerance, Decreased range of motion, Decreased strength  Visit Diagnosis: Stiffness of left shoulder, not elsewhere classified  Acute pain of left shoulder  Muscle weakness (generalized)     Problem List Patient Active Problem List   Diagnosis Date Noted  . Spinal stenosis 09/02/2015  . Numbness 09/02/2015  . Bradycardia 07/01/2014  . Thrombocytopenia ([place]) 06/30/2014  . CAP (  community acquired pneumonia) 06/29/2014  . Hypoxia 06/29/2014  . HTN (hypertension) 06/29/2014  . Prostate cancer ([place]) 06/29/2014  . Hyponatremia 06/29/2014    Wynelle Fanny, PTA 03/24/2016, 11:29 AM  [facility] [address] [place], [place], [address] Phone: [phone]   Fax:  [phone]  Name: Thomas Vega MRN: [id number] Date of Birth: 1926-12-17

## 2016-03-30 ENCOUNTER — Encounter: Payer: Self-pay | Admitting: Physical Therapy

## 2016-03-30 ENCOUNTER — Ambulatory Visit: Payer: Medicare Other | Attending: Orthopedic Surgery | Admitting: Physical Therapy

## 2016-03-30 DIAGNOSIS — M6281 Muscle weakness (generalized): Secondary | ICD-10-CM | POA: Insufficient documentation

## 2016-03-30 DIAGNOSIS — M25612 Stiffness of left shoulder, not elsewhere classified: Secondary | ICD-10-CM | POA: Insufficient documentation

## 2016-03-30 DIAGNOSIS — M25512 Pain in left shoulder: Secondary | ICD-10-CM | POA: Insufficient documentation

## 2016-03-30 NOTE — Therapy (Signed)
[place] V[place], [place], [address] Phone: [phone]   Fax:  [phone]  Physical Therapy Treatment  Patient Details  Name: Thomas Vega MRN: [id number] Date of Birth: 1926-06-27 Referring Provider: Meredith Pel MD  Encounter Date: 03/30/2016      PT End of Session - 03/30/16 1036    Visit Number 13   Number of Visits 16   Date for PT Re-Evaluation 04/04/16   PT Start Time 3329   PT Stop Time 1135   PT Time Calculation (min) 63 min   Activity Tolerance Patient tolerated treatment well   Behavior During Therapy [facility] for tasks assessed/performed      Past Medical History:  Diagnosis Date  . Abnormal finding of blood chemistry   . Bradycardia 07/01/2014  . CAP (community acquired pneumonia) 06/29/2014  . Heart murmur   . Irregular heart rate   . Melanoma ([place])   . Prostate cancer ([place])   . Thrombocytopenia ([place]) 06/30/2014    Past Surgical History:  Procedure Laterality Date  . BACK SURGERY    . CATARACT EXTRACTION    . KNEE SURGERY    . MELANOMA EXCISION      There were no vitals filed for this visit.      Subjective Assessment - 03/30/16 1036    Subjective Reports discomfort with pulley and ranger as well as discomfort with ER. Reports that last set of theraband he has had burning.   Patient Stated Goals Use my left UE without pain.   Currently in Pain? Other (Comment)  Gave no rating or description of any pain            [facility] PT Assessment - 03/30/16 0001      Assessment   Medical Diagnosis Left shoulder pain.   Next MD Visit TBD     Restrictions   Weight Bearing Restrictions No     ROM / Strength   AROM / PROM / Strength AROM     AROM   Overall AROM  Deficits   AROM Assessment Site Shoulder   Right/Left Shoulder Left   Left Shoulder Flexion 125 Degrees   Left Shoulder Internal Rotation 55 Degrees   Left Shoulder External Rotation 50 Degrees                     [facility] Adult PT Treatment/Exercise - 03/30/16 0001      Shoulder Exercises: Standing   Protraction Strengthening;Left;Theraband   Theraband Level (Shoulder Protraction) Level 2 (Red)   Protraction Limitations 3x10 reps   External Rotation Strengthening;Left;Theraband;20 reps   Theraband Level (Shoulder External Rotation) Level 2 (Red)   Internal Rotation Strengthening;Left;Theraband   Theraband Level (Shoulder Internal Rotation) Level 2 (Red)   Internal Rotation Limitations 3x10 reps   Extension Strengthening;Left;Theraband   Theraband Level (Shoulder Extension) Level 2 (Red)   Extension Limitations 3x10 reps   Row Strengthening;Left;Theraband   Theraband Level (Shoulder Row) Level 2 (Red)   Row Limitations 3x10 reps   Other Standing Exercises B shoulder high row green theraband 3x10 reps     Shoulder Exercises: Pulleys   Flexion Other (comment)  x5 min   Other Pulley Exercises Standing LUE ranger flex/ CW and CCW circles x30 reps each     Shoulder Exercises: ROM/Strengthening   UBE (Upper Arm Bike) 90 RPM x 5 min     Modalities   Modalities Electrical Stimulation;Moist Heat;Ultrasound     Moist Heat Therapy   Number Minutes  Moist Heat 15 Minutes   Moist Heat Location Shoulder     Electrical Stimulation   Electrical Stimulation Location L shoulder   Electrical Stimulation Action IFC   Electrical Stimulation Parameters 1-10 hz x15 min   Electrical Stimulation Goals Pain     Ultrasound   Ultrasound Location L AC joint/ posterior shoulder   Ultrasound Parameters 1.5 w/cm2, 100%, 1 mhz x10 min   Ultrasound Goals Pain                  PT Short Term Goals - 03/09/16 1047      PT SHORT TERM GOAL #1   Title Ind with initial HEP.   Time 2   Period Weeks   Status Achieved           PT Long Term Goals - 03/30/16 1139      PT LONG TERM GOAL #1   Title Ind with advanced HEP.   Time 8   Period Weeks   Status On-going     PT LONG TERM GOAL #2   Title  Active left shoulder flexion to 135 degrees to assist with the accomplishment of functional activties.   Time 8   Period Weeks   Status On-going  AROM 125 degrees 03/30/2016     PT LONG TERM GOAL #3   Title Active left shoulder ER= 80 degrees.   Time 8   Period Weeks   Status On-going  AROM 50 degrees 03/30/2016     PT LONG TERM GOAL #4   Title Left shoulder ER strength= 4+/5.   Time 8   Period Weeks   Status On-going     PT LONG TERM GOAL #5   Title Perform ADL's with left shoulder pain not > 2-3/10.   Time 8   Period Weeks   Status Achieved     PT LONG TERM GOAL #6   Title Eliminate referred pain symptoms.   Time 8   Period Weeks   Status Partially Met  No pain down LUE into elbow or hand but intermittant pain at L clavicle region and at deltoid attachment 03/09/2016               Plan - 03/30/16 1127    Clinical Impression Statement Patient arrived to treatment and was able to tolerate greater repititions with complaint of increased burning with third set of ten of exercises. Facial grimacing noted with L shoulder resisted ER with red theraband thus exercise stopped early. Normal modalities response noted following removal of the modalities. Goals remain on-going secondary to ROM, strength limitations.    Rehab Potential Excellent   PT Frequency 2x / week   PT Duration 8 weeks   PT Treatment/Interventions ADLs/Self Care Home Management;Cryotherapy;Electrical Stimulation;Ultrasound;Moist Heat;Therapeutic activities;Therapeutic exercise;Patient/family education;Passive range of motion;Manual techniques;Dry needling   PT Next Visit Plan Continue with L shoulder ROM and strengthening exercises with modalities PRN per MPT POC. GCODE NEXT TREATMENT.   PT Home Exercise Plan HEP- Home pulley per VCs   Consulted and Agree with Plan of Care Patient      Patient will benefit from skilled therapeutic intervention in order to improve the following deficits and impairments:   Pain, Decreased activity tolerance, Decreased range of motion, Decreased strength  Visit Diagnosis: Stiffness of left shoulder, not elsewhere classified  Acute pain of left shoulder  Muscle weakness (generalized)     Problem List Patient Active Problem List   Diagnosis Date Noted  . Spinal stenosis 09/02/2015  .  Numbness 09/02/2015  . Bradycardia 07/01/2014  . Thrombocytopenia ([place]) 06/30/2014  . CAP (community acquired pneumonia) 06/29/2014  . Hypoxia 06/29/2014  . HTN (hypertension) 06/29/2014  . Prostate cancer ([place]) 06/29/2014  . Hyponatremia 06/29/2014    Wynelle Fanny, PTA 03/30/2016, 11:42 AM  [facility] [address] [place], [place], [address] Phone: [phone]   Fax:  [phone]  Name: Marcellous Snarski MRN: [id number] Date of Birth: 1926-09-26

## 2016-04-01 ENCOUNTER — Encounter: Payer: Self-pay | Admitting: Physical Therapy

## 2016-04-01 ENCOUNTER — Ambulatory Visit: Payer: Medicare Other | Admitting: Physical Therapy

## 2016-04-01 DIAGNOSIS — M6281 Muscle weakness (generalized): Secondary | ICD-10-CM

## 2016-04-01 DIAGNOSIS — M25612 Stiffness of left shoulder, not elsewhere classified: Secondary | ICD-10-CM | POA: Diagnosis not present

## 2016-04-01 DIAGNOSIS — M25512 Pain in left shoulder: Secondary | ICD-10-CM | POA: Diagnosis not present

## 2016-04-01 NOTE — Therapy (Signed)
[facility] [place], [place], [address] Phone: [phone]   Fax:  [phone]  Physical Therapy Treatment  Patient Details  Name: Thomas Vega MRN: [id number] Date of Birth: 12-Jan-1927 Referring Provider: Meredith Pel MD  Encounter Date: 04/01/2016      PT End of Session - 04/01/16 1041    Visit Number 14   Number of Visits 16   Date for PT Re-Evaluation 04/04/16   PT Start Time 1034   PT Stop Time 1131   PT Time Calculation (min) 57 min   Activity Tolerance Patient tolerated treatment well   Behavior During Therapy [facility] for tasks assessed/performed      Past Medical History:  Diagnosis Date  . Abnormal finding of blood chemistry   . Bradycardia 07/01/2014  . CAP (community acquired pneumonia) 06/29/2014  . Heart murmur   . Irregular heart rate   . Melanoma ([place])   . Prostate cancer ([place])   . Thrombocytopenia ([place]) 06/30/2014    Past Surgical History:  Procedure Laterality Date  . BACK SURGERY    . CATARACT EXTRACTION    . KNEE SURGERY    . MELANOMA EXCISION      There were no vitals filed for this visit.      Subjective Assessment - 04/01/16 1039    Subjective Reports that he was hurting the day after last treatment but woke up feeling pretty good this morning.   Patient Stated Goals Use my left UE without pain.   Currently in Pain? Yes   Pain Score --  "a little bit"   Pain Location Shoulder   Pain Orientation Left   Pain Type Acute pain   Pain Onset More than a month ago            [facility] PT Assessment - 04/01/16 0001      Assessment   Medical Diagnosis Left shoulder pain.   Next MD Visit TBD     Restrictions   Weight Bearing Restrictions No                     [facility] Adult PT Treatment/Exercise - 04/01/16 0001      Shoulder Exercises: Standing   Protraction Strengthening;Left;Theraband   Theraband Level (Shoulder Protraction) Level 2 (Red)   Protraction  Limitations 3x10 reps   External Rotation Strengthening;Left;Theraband;20 reps  3x10 reps   Theraband Level (Shoulder External Rotation) Level 2 (Red)   Internal Rotation Strengthening;Left;Theraband   Theraband Level (Shoulder Internal Rotation) Level 2 (Red)   Internal Rotation Limitations 3x10 reps   Extension Strengthening;Left;Theraband   Theraband Level (Shoulder Extension) Level 2 (Red)   Extension Limitations 3x10 reps   Row Strengthening;Left;Theraband   Theraband Level (Shoulder Row) Level 2 (Red)   Row Limitations 3x10 reps   Other Standing Exercises B lat pulldown green theraband 3x10 reps     Shoulder Exercises: Pulleys   Flexion Other (comment)  x5 min   Other Pulley Exercises Standing LUE ranger flex/ CW and CCW circles x20 reps each     Shoulder Exercises: ROM/Strengthening   UBE (Upper Arm Bike) 90 RPM x 5 min     Modalities   Modalities Electrical Stimulation;Moist Heat     Moist Heat Therapy   Number Minutes Moist Heat 15 Minutes   Moist Heat Location Shoulder     Electrical Stimulation   Electrical Stimulation Location L shoulder   Electrical Stimulation Action IFC   Electrical Stimulation Parameters 1-10 hz x15  min   Electrical Stimulation Goals Pain     Manual Therapy   Manual Therapy Passive ROM   Passive ROM PROM of L shoulder into flex/ER with holds at end range                  PT Short Term Goals - 03/09/16 1047      PT SHORT TERM GOAL #1   Title Ind with initial HEP.   Time 2   Period Weeks   Status Achieved           PT Long Term Goals - 03/30/16 1139      PT LONG TERM GOAL #1   Title Ind with advanced HEP.   Time 8   Period Weeks   Status On-going     PT LONG TERM GOAL #2   Title Active left shoulder flexion to 135 degrees to assist with the accomplishment of functional activties.   Time 8   Period Weeks   Status On-going  AROM 125 degrees 03/30/2016     PT LONG TERM GOAL #3   Title Active left shoulder ER=  80 degrees.   Time 8   Period Weeks   Status On-going  AROM 50 degrees 03/30/2016     PT LONG TERM GOAL #4   Title Left shoulder ER strength= 4+/5.   Time 8   Period Weeks   Status On-going     PT LONG TERM GOAL #5   Title Perform ADL's with left shoulder pain not > 2-3/10.   Time 8   Period Weeks   Status Achieved     PT LONG TERM GOAL #6   Title Eliminate referred pain symptoms.   Time 8   Period Weeks   Status Partially Met  No pain down LUE into elbow or hand but intermittant pain at L clavicle region and at deltoid attachment 03/09/2016               Plan - 04/01/16 1138    Clinical Impression Statement Patient arrived to treatment with only reports of minimal L shoulder discomfort with no numerical rating provided. Patient able to be progressed through all exercises with increased reps with only complaint of discomfort with standing protraction with red theraband. PROM was conducted this treatment in efforts to increase L shoulder ROM with no complaints per patient. Normal modalities response noted following removal of the modalities.   Rehab Potential Excellent   PT Frequency 2x / week   PT Duration 8 weeks   PT Treatment/Interventions ADLs/Self Care Home Management;Cryotherapy;Electrical Stimulation;Ultrasound;Moist Heat;Therapeutic activities;Therapeutic exercise;Patient/family education;Passive range of motion;Manual techniques;Dry needling   PT Next Visit Plan Continue with L shoulder ROM and strengthening exercises with modalities PRN per MPT POC.    PT Home Exercise Plan HEP- Home pulley per VCs   Consulted and Agree with Plan of Care Patient      Patient will benefit from skilled therapeutic intervention in order to improve the following deficits and impairments:  Pain, Decreased activity tolerance, Decreased range of motion, Decreased strength  Visit Diagnosis: Stiffness of left shoulder, not elsewhere classified  Acute pain of left shoulder  Muscle  weakness (generalized)     Problem List Patient Active Problem List   Diagnosis Date Noted  . Spinal stenosis 09/02/2015  . Numbness 09/02/2015  . Bradycardia 07/01/2014  . Thrombocytopenia ([place]) 06/30/2014  . CAP (community acquired pneumonia) 06/29/2014  . Hypoxia 06/29/2014  . HTN (hypertension) 06/29/2014  . Prostate cancer ([place]) 06/29/2014  .  Hyponatremia 06/29/2014    Wynelle Fanny, PTA 04/01/2016, 11:47 AM  [facility] [address] [place], [place], [address] Phone: [phone]   Fax:  [phone]  Name: Briston Lax MRN: [id number] Date of Birth: [birth year]

## 2016-04-05 ENCOUNTER — Ambulatory Visit: Payer: Medicare Other | Admitting: Physical Therapy

## 2016-04-05 ENCOUNTER — Encounter: Payer: Self-pay | Admitting: Physical Therapy

## 2016-04-05 DIAGNOSIS — M25512 Pain in left shoulder: Secondary | ICD-10-CM

## 2016-04-05 DIAGNOSIS — M25612 Stiffness of left shoulder, not elsewhere classified: Secondary | ICD-10-CM

## 2016-04-05 DIAGNOSIS — M6281 Muscle weakness (generalized): Secondary | ICD-10-CM

## 2016-04-05 NOTE — Therapy (Signed)
[facility] [place], [place], [address] Phone: [phone]   Fax:  [phone]  Physical Therapy Treatment  Patient Details  Name: Thomas Vega MRN: [id number] Date of Birth: June 10, 1926 Referring Provider: Meredith Pel MD  Encounter Date: 04/05/2016      PT End of Session - 04/05/16 1034    Visit Number 15   Number of Visits 16   Date for PT Re-Evaluation 04/04/16   PT Start Time 1034   PT Stop Time 1127   PT Time Calculation (min) 53 min   Activity Tolerance Patient tolerated treatment well   Behavior During Therapy [facility] for tasks assessed/performed      Past Medical History:  Diagnosis Date  . Abnormal finding of blood chemistry   . Bradycardia 07/01/2014  . CAP (community acquired pneumonia) 06/29/2014  . Heart murmur   . Irregular heart rate   . Melanoma ([place])   . Prostate cancer ([place])   . Thrombocytopenia ([place]) 06/30/2014    Past Surgical History:  Procedure Laterality Date  . BACK SURGERY    . CATARACT EXTRACTION    . KNEE SURGERY    . MELANOMA EXCISION      There were no vitals filed for this visit.      Subjective Assessment - 04/05/16 1034    Subjective Reports that his shoulder doesn't feel that bad. Reports that he had some pain with UE ranger.   Patient Stated Goals Use my left UE without pain.   Currently in Pain? No/denies            [facility] PT Assessment - 04/05/16 0001      Assessment   Medical Diagnosis Left shoulder pain.   Next MD Visit TBD     Restrictions   Weight Bearing Restrictions No                     [facility] Adult PT Treatment/Exercise - 04/05/16 0001      Shoulder Exercises: Standing   Protraction Strengthening;Left;Theraband   Theraband Level (Shoulder Protraction) Level 2 (Red)   Protraction Limitations 3x10 reps   External Rotation Strengthening;Left;Theraband;20 reps   Theraband Level (Shoulder External Rotation) Level 2 (Red)   Internal  Rotation Strengthening;Left;Theraband   Theraband Level (Shoulder Internal Rotation) Level 2 (Red)   Internal Rotation Limitations 3x10 reps   Extension Strengthening;Left;Theraband   Theraband Level (Shoulder Extension) Level 2 (Red)   Extension Limitations 3x10 reps   Row Strengthening;Left;Theraband   Theraband Level (Shoulder Row) Level 2 (Red)   Row Limitations 3x10 reps     Shoulder Exercises: Pulleys   Flexion Other (comment)  x5 min   Other Pulley Exercises Standing LUE ranger flex/ CW and CCW circles x20 reps each     Shoulder Exercises: ROM/Strengthening   UBE (Upper Arm Bike) 90 RPM x 5 min     Modalities   Modalities Electrical Stimulation;Moist Heat;Ultrasound     Moist Heat Therapy   Number Minutes Moist Heat 15 Minutes   Moist Heat Location Shoulder     Electrical Stimulation   Electrical Stimulation Location L shoulder/ UT   Electrical Stimulation Action IFC   Electrical Stimulation Parameters 1-10 hz x15 min   Electrical Stimulation Goals Pain     Ultrasound   Ultrasound Location L posterior shoulder/ UT   Ultrasound Parameters 1.5 w/cm2, 100%, 1 mhz x10 min   Ultrasound Goals Pain  PT Short Term Goals - 03/09/16 1047      PT SHORT TERM GOAL #1   Title Ind with initial HEP.   Time 2   Period Weeks   Status Achieved           PT Long Term Goals - 03/30/16 1139      PT LONG TERM GOAL #1   Title Ind with advanced HEP.   Time 8   Period Weeks   Status On-going     PT LONG TERM GOAL #2   Title Active left shoulder flexion to 135 degrees to assist with the accomplishment of functional activties.   Time 8   Period Weeks   Status On-going  AROM 125 degrees 03/30/2016     PT LONG TERM GOAL #3   Title Active left shoulder ER= 80 degrees.   Time 8   Period Weeks   Status On-going  AROM 50 degrees 03/30/2016     PT LONG TERM GOAL #4   Title Left shoulder ER strength= 4+/5.   Time 8   Period Weeks   Status On-going      PT LONG TERM GOAL #5   Title Perform ADL's with left shoulder pain not > 2-3/10.   Time 8   Period Weeks   Status Achieved     PT LONG TERM GOAL #6   Title Eliminate referred pain symptoms.   Time 8   Period Weeks   Status Partially Met  No pain down LUE into elbow or hand but intermittant pain at L clavicle region and at deltoid attachment 03/09/2016               Plan - 04/05/16 1114    Clinical Impression Statement Patient arrived to treatment with no current L shoulder pain although pain exaggerated with UE ranger. Patient able to complete exercises with discomfort even L shoulder ER and IR. Patient experienced pain in L posterior shoulder and UT region with rotation strengthening exercises per patient report. Tightness was palpated in L UT prior to [place] to UT and posterior shoulder. Normal modalities response noted following removal of the modalities.   Rehab Potential Excellent   PT Frequency 2x / week   PT Duration 8 weeks   PT Treatment/Interventions ADLs/Self Care Home Management;Cryotherapy;Electrical Stimulation;Ultrasound;Moist Heat;Therapeutic activities;Therapeutic exercise;Patient/family education;Passive range of motion;Manual techniques;Dry needling   PT Next Visit Plan Continue with L shoulder ROM and strengthening exercises with modalities PRN per MPT POC.    PT Home Exercise Plan HEP- Home pulley per VCs   Consulted and Agree with Plan of Care Patient      Patient will benefit from skilled therapeutic intervention in order to improve the following deficits and impairments:  Pain, Decreased activity tolerance, Decreased range of motion, Decreased strength  Visit Diagnosis: Stiffness of left shoulder, not elsewhere classified  Acute pain of left shoulder  Muscle weakness (generalized)     Problem List Patient Active Problem List   Diagnosis Date Noted  . Spinal stenosis 09/02/2015  . Numbness 09/02/2015  . Bradycardia 07/01/2014  .  Thrombocytopenia ([place]) 06/30/2014  . CAP (community acquired pneumonia) 06/29/2014  . Hypoxia 06/29/2014  . HTN (hypertension) 06/29/2014  . Prostate cancer ([place]) 06/29/2014  . Hyponatremia 06/29/2014    Wynelle Fanny, PTA 04/05/2016, 11:29 AM  [facility] [address] [place], [place], [address] Phone: [phone]   Fax:  [phone]  Name: Thomas Vega MRN: [id number] Date of Birth: 07/29/1926

## 2016-04-08 ENCOUNTER — Ambulatory Visit: Payer: Medicare Other | Admitting: Physical Therapy

## 2016-04-08 ENCOUNTER — Encounter: Payer: Self-pay | Admitting: Physical Therapy

## 2016-04-08 DIAGNOSIS — M25612 Stiffness of left shoulder, not elsewhere classified: Secondary | ICD-10-CM

## 2016-04-08 DIAGNOSIS — M25512 Pain in left shoulder: Secondary | ICD-10-CM

## 2016-04-08 DIAGNOSIS — M6281 Muscle weakness (generalized): Secondary | ICD-10-CM | POA: Diagnosis not present

## 2016-04-08 NOTE — Therapy (Signed)
[facility] [place], [place], [address] Phone: [phone]   Fax:  [phone]  Physical Therapy Treatment  Patient Details  Name: Thomas Vega MRN: [id number] Date of Birth: 06-01-1926 Referring Provider: Meredith Pel MD  Encounter Date: 04/08/2016      PT End of Session - 04/08/16 1032    Visit Number 16   Number of Visits 16   Date for PT Re-Evaluation 04/04/16   PT Start Time 1031   PT Stop Time 1122   PT Time Calculation (min) 51 min   Activity Tolerance Patient tolerated treatment well   Behavior During Therapy [facility] for tasks assessed/performed      Past Medical History:  Diagnosis Date  . Abnormal finding of blood chemistry   . Bradycardia 07/01/2014  . CAP (community acquired pneumonia) 06/29/2014  . Heart murmur   . Irregular heart rate   . Melanoma ([place])   . Prostate cancer ([place])   . Thrombocytopenia ([place]) 06/30/2014    Past Surgical History:  Procedure Laterality Date  . BACK SURGERY    . CATARACT EXTRACTION    . KNEE SURGERY    . MELANOMA EXCISION      There were no vitals filed for this visit.      Subjective Assessment - 04/08/16 1032    Subjective Reports MD visit with Dr. Marlou Sa 04/20/2016.   Patient Stated Goals Use my left UE without pain.   Currently in Pain? Yes   Pain Score 3    Pain Location Shoulder   Pain Orientation Left   Pain Type Acute pain   Pain Onset More than a month ago            [facility] PT Assessment - 04/08/16 0001      Assessment   Medical Diagnosis Left shoulder pain.   Next MD Visit 04/20/2016  Dr. Marlou Sa     Restrictions   Weight Bearing Restrictions No     ROM / Strength   AROM / PROM / Strength AROM;Strength     AROM   Overall AROM  Deficits   AROM Assessment Site Shoulder   Right/Left Shoulder Left   Left Shoulder Flexion 133 Degrees   Left Shoulder Internal Rotation 61 Degrees   Left Shoulder External Rotation 60 Degrees     Strength    Overall Strength Deficits   Strength Assessment Site Shoulder   Right/Left Shoulder Left   Left Shoulder Flexion 4+/5   Left Shoulder Internal Rotation 5/5   Left Shoulder External Rotation 4/5                     [facility] Adult PT Treatment/Exercise - 04/08/16 0001      Shoulder Exercises: Seated   External Rotation Strengthening;Both;Theraband   Theraband Level (Shoulder External Rotation) Level 3 (Green)   External Rotation Limitations 3x10 reps   Flexion Strengthening;Both;20 reps;Theraband   Theraband Level (Shoulder Flexion) Level 3 (Green)   Flexion Limitations B shoulder horizontal abduction with OH elevation     Shoulder Exercises: Standing   Protraction Strengthening;Left;Theraband   Theraband Level (Shoulder Protraction) Level 2 (Red)   Protraction Limitations 3x10 reps   External Rotation Strengthening;Left;Theraband  3x10 reps   Theraband Level (Shoulder External Rotation) Level 2 (Red)   Internal Rotation Strengthening;Left;Theraband   Theraband Level (Shoulder Internal Rotation) Level 2 (Red)   Internal Rotation Limitations 3x10 reps   Extension Strengthening;Left;Theraband   Theraband Level (Shoulder Extension) Level 2 (Red)  Extension Limitations 3x10 reps   Row Strengthening;Left;Theraband   Theraband Level (Shoulder Row) Level 2 (Red)   Row Limitations 3x10 reps     Shoulder Exercises: Pulleys   Flexion 3 minutes   Other Pulley Exercises Standing LUE ranger flex/ CW and CCW circles x20 reps each     Shoulder Exercises: ROM/Strengthening   UBE (Upper Arm Bike) 90 RPM x 6 min     Modalities   Modalities Cryotherapy;Electrical Stimulation     Cryotherapy   Number Minutes Cryotherapy 15 Minutes   Cryotherapy Location Shoulder   Type of Cryotherapy Ice pack     Electrical Stimulation   Electrical Stimulation Location L shoulder/ UT   Electrical Stimulation Action IFC   Electrical Stimulation Parameters 1-10 hz x15 min   Electrical  Stimulation Goals Pain                PT Education - 04/08/16 1110    Education provided Yes   Education Details HEP- RW4 with red theraband   Person(s) Educated Patient   Methods Explanation;Demonstration;Verbal cues;Handout   Comprehension Verbalized understanding;Returned demonstration;Verbal cues required          PT Short Term Goals - 03/09/16 1047      PT SHORT TERM GOAL #1   Title Ind with initial HEP.   Time 2   Period Weeks   Status Achieved           PT Long Term Goals - 04/08/16 1106      PT LONG TERM GOAL #1   Title Ind with advanced HEP.   Time 8   Period Weeks   Status On-going     PT LONG TERM GOAL #2   Title Active left shoulder flexion to 135 degrees to assist with the accomplishment of functional activties.   Time 8   Period Weeks   Status Partially Met  AROM 133 degrees 04/08/2016     PT LONG TERM GOAL #3   Title Active left shoulder ER= 80 degrees.   Time 8   Period Weeks   Status Not Met  AROM 60 degrees 04/08/2016     PT LONG TERM GOAL #4   Title Left shoulder ER strength= 4+/5.   Time 8   Period Weeks   Status Partially Met  L shoulder ER MMT 4/5 04/08/2016     PT LONG TERM GOAL #5   Title Perform ADL's with left shoulder pain not > 2-3/10.   Time 8   Period Weeks   Status Achieved     PT LONG TERM GOAL #6   Title Eliminate referred pain symptoms.   Time 8   Period Weeks   Status Achieved               Plan - 04/08/16 1159    Clinical Impression Statement Patient arrived to treatment with reports of L shoulder discomfort that was exaggerated with pulleys and RW4 exercises with red theraband. Patient able to complete exercises although reporting the pain after he completed painful exercises. AROM L shoulder flexion measured as 133 deg in sitting. AROM L shoulder ER 60 deg, IR 61 deg. MMT L shoulder flex 4+/5, IR 5/5, ER 4/5. Patient provided with new strengthening HEP with theraband and patient verbalizing  understanding of education. Normal modalities response noted following removal of the modalities. Able to partially achieve ROM and MMT goals and fully achieve ADL and referring pain goals.    Rehab Potential Excellent   PT Frequency 2x /  week   PT Duration 8 weeks   PT Treatment/Interventions ADLs/Self Care Home Management;Cryotherapy;Electrical Stimulation;Ultrasound;Moist Heat;Therapeutic activities;Therapeutic exercise;Patient/family education;Passive range of motion;Manual techniques;Dry needling   PT Next Visit Plan Continue with L shoulder ROM and strengthening exercises with modalities PRN per MPT POC.    PT Home Exercise Plan HEP- Home pulley per VCs, RW4 with red theraband   Consulted and Agree with Plan of Care Patient      Patient will benefit from skilled therapeutic intervention in order to improve the following deficits and impairments:  Pain, Decreased activity tolerance, Decreased range of motion, Decreased strength  Visit Diagnosis: Stiffness of left shoulder, not elsewhere classified  Acute pain of left shoulder  Muscle weakness (generalized)     Problem List Patient Active Problem List   Diagnosis Date Noted  . Spinal stenosis 09/02/2015  . Numbness 09/02/2015  . Bradycardia 07/01/2014  . Thrombocytopenia ([place]) 06/30/2014  . CAP (community acquired pneumonia) 06/29/2014  . Hypoxia 06/29/2014  . HTN (hypertension) 06/29/2014  . Prostate cancer ([place]) 06/29/2014  . Hyponatremia 06/29/2014    Ahmed Prima, PTA 04/08/16 12:05 PM  [facility] [address] [place], [place], [address] Phone: [phone]   Fax:  [phone]  Name: Tayari Yankee MRN: [id number] Date of Birth: 03-Apr-1926  PHYSICAL THERAPY DISCHARGE SUMMARY  Visits from Start of Care: 16.  Current functional level related to goals / functional outcomes: See above.   Remaining deficits: Overall very good progress though patient continues to  still have a loss of left shoulder ROM and strength.   Education / Equipment: HEP Plan: Patient agrees to discharge.  Patient goals were partially met. Patient is being discharged due to being pleased with the current functional level.  ?????         Mali Applegate MPT

## 2016-04-08 NOTE — Patient Instructions (Addendum)
Scapular Retraction: Bilateral    Facing anchor, pull left arm back, bringing shoulder blades together with elbow flexed. Repeat __10__ times per set. Do __3__ sets per session. Do __2-3__ sessions per day.  http://orth.exer.us/176   Copyright  VHI. All rights reserved.  Strengthening: Resisted External Rotation    Hold tubing in left hand, elbow at side and forearm across body. Rotate forearm out. Repeat _10___ times per set. Do __3__ sets per session. Do __2-3__ sessions per day.  http://orth.exer.us/828   Copyright  VHI. All rights reserved.  Strengthening: Resisted Internal Rotation    Hold tubing in left hand, elbow at side and forearm out. Rotate forearm in across body. Repeat _10___ times per set. Do __3__ sets per session. Do _2-3___ sessions per day.  http://orth.exer.us/830   Copyright  VHI. All rights reserved.  Strengthening: Resisted Extension    Hold tubing in left hand, arm forward. Pull arm back, elbow straight. Repeat _10___ times per set. Do _3___ sets per session. Do _2-3___ sessions per day.  http://orth.exer.us/832   Copyright  VHI. All rights reserved.

## 2016-04-20 ENCOUNTER — Ambulatory Visit (INDEPENDENT_AMBULATORY_CARE_PROVIDER_SITE_OTHER): Payer: Medicare Other | Admitting: Orthopedic Surgery

## 2016-04-20 ENCOUNTER — Encounter (INDEPENDENT_AMBULATORY_CARE_PROVIDER_SITE_OTHER): Payer: Self-pay | Admitting: Orthopedic Surgery

## 2016-04-20 ENCOUNTER — Other Ambulatory Visit (INDEPENDENT_AMBULATORY_CARE_PROVIDER_SITE_OTHER): Payer: Self-pay | Admitting: Specialist

## 2016-04-20 DIAGNOSIS — M25512 Pain in left shoulder: Secondary | ICD-10-CM

## 2016-04-20 DIAGNOSIS — G8929 Other chronic pain: Secondary | ICD-10-CM | POA: Diagnosis not present

## 2016-04-20 DIAGNOSIS — M48062 Spinal stenosis, lumbar region with neurogenic claudication: Secondary | ICD-10-CM

## 2016-04-20 DIAGNOSIS — C61 Malignant neoplasm of prostate: Secondary | ICD-10-CM | POA: Diagnosis not present

## 2016-04-20 NOTE — Progress Notes (Signed)
Office Visit Note   Patient: Thomas Vega           Date of Birth: 10-26-1926           MRN: [id number] Visit Date: 04/20/2016 Requested by: Jani Gravel, MD [address] [place], [place] [address] PCP: Jani Gravel, MD  Subjective: Chief Complaint  Patient presents with  . Left Shoulder - Pain, Follow-up    HPI Nancy is an 83 year old patient with left shoulder pain.  He had an injury.  He's been in physical therapy for 2 months and is doing much better.  Would like to continue physical therapy for 4 more weeks.  That prescription is provided today.  Currently taking no medication for the problem.              Review of Systems All systems reviewed are negative as they relate to the chief complaint within the history of present illness.  Patient denies  fevers or chills.    Assessment & Plan: Visit Diagnoses:  1. Chronic left shoulder pain     Plan: Impression is improvement in left shoulder pain following 2 months of physical therapy.  He still has a little bit of weakness but in general he is functional and has significantly less pain than he had when he started.  We'll continue with one more month of physical therapy to progress to home exercise program.  Follow-up with me as needed  Follow-Up Instructions: Return if symptoms worsen or fail to improve.   Orders:  No orders of the defined types were placed in this encounter.  No orders of the defined types were placed in this encounter.     Procedures: No procedures performed   Clinical Data: No additional findings.  Objective: Vital Signs: There were no vitals taken for this visit.  Physical Exam   Constitutional: Patient appears well-developed HEENT:  Head: Normocephalic Eyes:EOM are normal Neck: Normal range of motion Cardiovascular: Normal rate Pulmonary/chest: Effort normal Neurologic: Patient is alert Skin: Skin is warm Psychiatric: Patient has normal mood and affect    Ortho Exam  examination the left shoulder demonstrates reasonable passive range of motion with some grinding at 90 of abduction with forward flexion and extension.  Rotator cuff strength is somewhat weaker suture statustesting.  Subscap testing seems pretty symmetrically intact.  He has less pain to palpation around the shoulder joint and no before meals joint tenderness to direct palpation  Specialty Comments:  No specialty comments available.  Imaging: No results found.   PMFS History: Patient Active Problem List   Diagnosis Date Noted  . Chronic left shoulder pain 04/20/2016  . Spinal stenosis 09/02/2015  . Numbness 09/02/2015  . Bradycardia 07/01/2014  . Thrombocytopenia ([place]) 06/30/2014  . CAP (community acquired pneumonia) 06/29/2014  . Hypoxia 06/29/2014  . HTN (hypertension) 06/29/2014  . Prostate cancer ([place]) 06/29/2014  . Hyponatremia 06/29/2014   Past Medical History:  Diagnosis Date  . Abnormal finding of blood chemistry   . Bradycardia 07/01/2014  . CAP (community acquired pneumonia) 06/29/2014  . Heart murmur   . Irregular heart rate   . Melanoma ([place])   . Prostate cancer ([place])   . Thrombocytopenia ([place]) 06/30/2014    Family History  Problem Relation Age of Onset  . Heart Problems Mother   . Stroke Father     Past Surgical History:  Procedure Laterality Date  . BACK SURGERY    . CATARACT EXTRACTION    . KNEE SURGERY    .  MELANOMA EXCISION     Social History   Occupational History  . Retired     Social History Main Topics  . Smoking status: Never Smoker  . Smokeless tobacco: Never Used  . Alcohol use No  . Drug use: No  . Sexual activity: Not on file

## 2016-04-26 ENCOUNTER — Ambulatory Visit: Payer: Medicare Other | Admitting: Physical Therapy

## 2016-04-26 DIAGNOSIS — M25512 Pain in left shoulder: Secondary | ICD-10-CM

## 2016-04-26 DIAGNOSIS — M6281 Muscle weakness (generalized): Secondary | ICD-10-CM | POA: Diagnosis not present

## 2016-04-26 DIAGNOSIS — M25612 Stiffness of left shoulder, not elsewhere classified: Secondary | ICD-10-CM | POA: Diagnosis not present

## 2016-04-26 NOTE — Therapy (Signed)
[facility] [place], [place], [address] Phone: [phone]   Fax:  [phone]  Physical Therapy Treatment  Patient Details  Name: Thomas Vega MRN: [id number] Date of Birth: Feb 07, 1927 Referring Provider: Meredith Pel MD  Encounter Date: 04/26/2016      PT End of Session - 04/26/16 1551    Number of Visits 28   Date for PT Re-Evaluation 06/03/16   PT Start Time 0146   PT Stop Time 0240   PT Time Calculation (min) 54 min   Activity Tolerance Patient tolerated treatment well   Behavior During Therapy [facility] for tasks assessed/performed      Past Medical History:  Diagnosis Date  . Abnormal finding of blood chemistry   . Bradycardia 07/01/2014  . CAP (community acquired pneumonia) 06/29/2014  . Heart murmur   . Irregular heart rate   . Melanoma ([place])   . Prostate cancer ([employer])   . Thrombocytopenia ([employer]) 06/30/2014    Past Surgical History:  Procedure Laterality Date  . BACK SURGERY    . CATARACT EXTRACTION    . KNEE SURGERY    . MELANOMA EXCISION      There were no vitals filed for this visit.      Subjective Assessment - 04/26/16 1554    Subjective The patient states his shoulder is still doing well and he would like to continue.   Patient Stated Goals Use my left UE without pain.   Pain Score 4    Pain Location Shoulder   Pain Orientation Left   Pain Type Acute pain   Pain Onset More than a month ago                         [facility] Adult PT Treatment/Exercise - 04/26/16 0001      Shoulder Exercises: Pulleys   Flexion --  8 minutes.   Other Pulley Exercises UE Ranger on wall x 5 minutes.     Shoulder Exercises: ROM/Strengthening   UBE (Upper Arm Bike) --  10 minutes.     Modalities   Modalities Electrical Stimulation     Moist Heat Therapy   Number Minutes Moist Heat 15 Minutes   Moist Heat Location --  Left shoulder.     Acupuncturist  Location --  Left shoulder.   Electrical Stimulation Action IFC   Electrical Stimulation Parameters 80-150 Hz x 15 minutes.   Electrical Stimulation Goals Pain                  PT Short Term Goals - 03/09/16 1047      PT SHORT TERM GOAL #1   Title Ind with initial HEP.   Time 2   Period Weeks   Status Achieved           PT Long Term Goals - 04/08/16 1106      PT LONG TERM GOAL #1   Title Ind with advanced HEP.   Time 8   Period Weeks   Status On-going     PT LONG TERM GOAL #2   Title Active left shoulder flexion to 135 degrees to assist with the accomplishment of functional activties.   Time 8   Period Weeks   Status Partially Met  AROM 133 degrees 04/08/2016     PT LONG TERM GOAL #3   Title Active left shoulder ER= 80 degrees.   Time 8   Period Weeks  Status Not Met  AROM 60 degrees 04/08/2016     PT LONG TERM GOAL #4   Title Left shoulder ER strength= 4+/5.   Time 8   Period Weeks   Status Partially Met  L shoulder ER MMT 4/5 04/08/2016     PT LONG TERM GOAL #5   Title Perform ADL's with left shoulder pain not > 2-3/10.   Time 8   Period Weeks   Status Achieved     PT LONG TERM GOAL #6   Title Eliminate referred pain symptoms.   Time 8   Period Weeks   Status Achieved               Plan - 04/26/16 1557    Clinical Impression Statement The patient has been out of therapy about 2 weeks but sttaes his left shoulder is still doing well.  He would like to continue at this time.  Patient expected to meet goals.   Rehab Potential Excellent   PT Frequency 2x / week   PT Duration 8 weeks   PT Treatment/Interventions ADLs/Self Care Home Management;Cryotherapy;Electrical Stimulation;Ultrasound;Moist Heat;Therapeutic activities;Therapeutic exercise;Patient/family education;Passive range of motion;Manual techniques;Dry needling   PT Next Visit Plan Continue with L shoulder ROM and strengthening exercises with modalities PRN per MPT POC.    PT  Home Exercise Plan HEP- Home pulley per VCs, RW4 with red theraband      Patient will benefit from skilled therapeutic intervention in order to improve the following deficits and impairments:     Visit Diagnosis: Stiffness of left shoulder, not elsewhere classified - Plan: PT plan of care cert/re-cert  Acute pain of left shoulder - Plan: PT plan of care cert/re-cert     Problem List Patient Active Problem List   Diagnosis Date Noted  . Chronic left shoulder pain 04/20/2016  . Spinal stenosis 09/02/2015  . Numbness 09/02/2015  . Bradycardia 07/01/2014  . Thrombocytopenia ([place]) 06/30/2014  . CAP (community acquired pneumonia) 06/29/2014  . Hypoxia 06/29/2014  . HTN (hypertension) 06/29/2014  . Prostate cancer ([place]) 06/29/2014  . Hyponatremia 06/29/2014    Dezerae Freiberger, [place] MPT 04/26/2016, 4:06 PM  [facility] [place], [place], [address] Phone: [phone]   Fax:  [phone]  Name: Thomas Vega MRN: [id number] Date of Birth: 16-Apr-1926

## 2016-04-27 DIAGNOSIS — C61 Malignant neoplasm of prostate: Secondary | ICD-10-CM | POA: Diagnosis not present

## 2016-04-27 DIAGNOSIS — N401 Enlarged prostate with lower urinary tract symptoms: Secondary | ICD-10-CM | POA: Diagnosis not present

## 2016-04-27 DIAGNOSIS — R3912 Poor urinary stream: Secondary | ICD-10-CM | POA: Diagnosis not present

## 2016-04-28 ENCOUNTER — Ambulatory Visit: Payer: Medicare Other | Attending: Orthopedic Surgery | Admitting: *Deleted

## 2016-04-28 DIAGNOSIS — M25512 Pain in left shoulder: Secondary | ICD-10-CM

## 2016-04-28 DIAGNOSIS — M6281 Muscle weakness (generalized): Secondary | ICD-10-CM | POA: Diagnosis not present

## 2016-04-28 DIAGNOSIS — M25612 Stiffness of left shoulder, not elsewhere classified: Secondary | ICD-10-CM | POA: Insufficient documentation

## 2016-04-28 NOTE — Therapy (Signed)
[facility] [place], [place], [address] Phone: [phone]   Fax:  [phone]  Physical Therapy Treatment  Patient Details  Name: Thomas Vega MRN: [id number] Date of Birth: 12/04/1926 Referring Provider: Meredith Pel MD  Encounter Date: 04/28/2016      PT End of Session - 04/28/16 1427    Visit Number 17   Number of Visits 28   Date for PT Re-Evaluation 06/03/16   PT Start Time 4332   PT Stop Time 1438   PT Time Calculation (min) 53 min      Past Medical History:  Diagnosis Date  . Abnormal finding of blood chemistry   . Bradycardia 07/01/2014  . CAP (community acquired pneumonia) 06/29/2014  . Heart murmur   . Irregular heart rate   . Melanoma ([place])   . Prostate cancer ([place])   . Thrombocytopenia ([place]) 06/30/2014    Past Surgical History:  Procedure Laterality Date  . BACK SURGERY    . CATARACT EXTRACTION    . KNEE SURGERY    . MELANOMA EXCISION      There were no vitals filed for this visit.      Subjective Assessment - 04/28/16 1347    Subjective The patient states his shoulder is still doing well and he would like to continue.   2/10 today   Patient Stated Goals Use my left UE without pain.   Currently in Pain? Yes   Pain Score 4    Pain Location Shoulder   Pain Orientation Left   Pain Descriptors / Indicators Nagging   Pain Type Acute pain   Pain Onset More than a month ago   Pain Frequency Intermittent                         [facility] Adult PT Treatment/Exercise - 04/28/16 0001      Shoulder Exercises: Standing   External Rotation Strengthening;Left;Theraband;20 reps  3x10 reps   Theraband Level (Shoulder External Rotation) Level 2 (Red)   Internal Rotation Strengthening;Left;Theraband;20 reps   Theraband Level (Shoulder Internal Rotation) Level 2 (Red)   Extension Strengthening;Left;Theraband;20 reps   Theraband Level (Shoulder Extension) Level 2 (Red)   Row  Strengthening;Left;Theraband;20 reps     Shoulder Exercises: Pulleys   Flexion --  5 mins   Other Pulley Exercises UE Ranger on wall x 5 minutes.     Shoulder Exercises: ROM/Strengthening   UBE (Upper Arm Bike) 90 RPM x 6 min     Modalities   Modalities Electrical Stimulation     Moist Heat Therapy   Number Minutes Moist Heat 15 Minutes   Moist Heat Location --  Left shoulder.     Acupuncturist Location L shoulder/ UT IFC  x 15 mins   80-'150hz'$   Left shoulder.   Electrical Stimulation Goals Pain                  PT Short Term Goals - 03/09/16 1047      PT SHORT TERM GOAL #1   Title Ind with initial HEP.   Time 2   Period Weeks   Status Achieved           PT Long Term Goals - 04/28/16 1423      PT LONG TERM GOAL #1   Title Ind with advanced HEP.   Time 8   Period Weeks   Status On-going     PT LONG  TERM GOAL #2   Title Active left shoulder flexion to 135 degrees to assist with the accomplishment of functional activties.   Time 8   Period Weeks   Status Partially Met     PT LONG TERM GOAL #3   Title Active left shoulder ER= 80 degrees.   Time 8   Period Weeks   Status On-going     PT LONG TERM GOAL #4   Title Left shoulder ER strength= 4+/5.   Time 8   Period Weeks   Status Partially Met     PT LONG TERM GOAL #5   Title Perform ADL's with left shoulder pain not > 2-3/10.   Time 8   Period Weeks   Status Achieved     PT LONG TERM GOAL #6   Title Eliminate referred pain symptoms.   Time 8   Period Weeks   Status Achieved               Plan - 04/28/16 1424    Clinical Impression Statement Pt did fairly well with Rx today with only minimal pain increase during therex. He is close to meeting LTGs for ROM, but still with flexion and ER deficits and are ongoing.   Rehab Potential Excellent   PT Frequency 2x / week   PT Duration 8 weeks   PT Treatment/Interventions ADLs/Self Care Home  Management;Cryotherapy;Electrical Stimulation;Ultrasound;Moist Heat;Therapeutic activities;Therapeutic exercise;Patient/family education;Passive range of motion;Manual techniques;Dry needling   PT Next Visit Plan Continue with L shoulder ROM and strengthening exercises with modalities PRN per MPT POC.    PT Home Exercise Plan HEP- Home pulley per VCs, RW4 with red theraband   Consulted and Agree with Plan of Care Patient      Patient will benefit from skilled therapeutic intervention in order to improve the following deficits and impairments:  Pain, Decreased activity tolerance, Decreased range of motion, Decreased strength  Visit Diagnosis: Stiffness of left shoulder, not elsewhere classified  Acute pain of left shoulder  Muscle weakness (generalized)     Problem List Patient Active Problem List   Diagnosis Date Noted  . Chronic left shoulder pain 04/20/2016  . Spinal stenosis 09/02/2015  . Numbness 09/02/2015  . Bradycardia 07/01/2014  . Thrombocytopenia ([place]) 06/30/2014  . CAP (community acquired pneumonia) 06/29/2014  . Hypoxia 06/29/2014  . HTN (hypertension) 06/29/2014  . Prostate cancer ([place]) 06/29/2014  . Hyponatremia 06/29/2014    Burnis Halling,CHRIS, PTA 04/28/2016, 2:43 PM  [facility] [address] [place], [place], [address] Phone: [phone]   Fax:  [phone]  Name: Thomas Vega MRN: [id number] Date of Birth: Mar 14, 1927

## 2016-05-03 ENCOUNTER — Encounter: Payer: Self-pay | Admitting: Physical Therapy

## 2016-05-03 ENCOUNTER — Ambulatory Visit: Payer: Medicare Other | Admitting: Physical Therapy

## 2016-05-03 DIAGNOSIS — M25612 Stiffness of left shoulder, not elsewhere classified: Secondary | ICD-10-CM

## 2016-05-03 DIAGNOSIS — M25512 Pain in left shoulder: Secondary | ICD-10-CM | POA: Diagnosis not present

## 2016-05-03 DIAGNOSIS — M6281 Muscle weakness (generalized): Secondary | ICD-10-CM | POA: Diagnosis not present

## 2016-05-03 NOTE — Therapy (Signed)
[facility] [place], [place], [address] Phone: [phone]   Fax:  [phone]  Physical Therapy Treatment  Patient Details  Name: Thomas Vega MRN: [id number] Date of Birth: 15-Nov-1926 Referring Provider: Meredith Pel MD  Encounter Date: 05/03/2016      PT End of Session - 05/03/16 1035    Visit Number 18   Number of Visits 28   Date for PT Re-Evaluation 06/03/16   PT Start Time 7517   PT Stop Time 1118   PT Time Calculation (min) 43 min   Activity Tolerance Patient tolerated treatment well   Behavior During Therapy [facility] for tasks assessed/performed      Past Medical History:  Diagnosis Date  . Abnormal finding of blood chemistry   . Bradycardia 07/01/2014  . CAP (community acquired pneumonia) 06/29/2014  . Heart murmur   . Irregular heart rate   . Melanoma ([place])   . Prostate cancer ([place])   . Thrombocytopenia ([employer]) 06/30/2014    Past Surgical History:  Procedure Laterality Date  . BACK SURGERY    . CATARACT EXTRACTION    . KNEE SURGERY    . MELANOMA EXCISION      There were no vitals filed for this visit.      Subjective Assessment - 05/03/16 1034    Subjective Reports a tiny bit of L shoulder pain today.   Patient Stated Goals Use my left UE without pain.   Currently in Pain? Other (Comment)  No pain score provided by patient with description today            [facility] PT Assessment - 05/03/16 0001      Assessment   Medical Diagnosis Left shoulder pain.     Restrictions   Weight Bearing Restrictions No                     [facility] Adult PT Treatment/Exercise - 05/03/16 0001      Shoulder Exercises: Seated   Flexion Strengthening;Both;Weights   Flexion Weight (lbs) 1   Flexion Limitations 3x10 reps     Shoulder Exercises: Standing   Protraction Strengthening;Left;Theraband   Theraband Level (Shoulder Protraction) Level 2 (Red)   Protraction Limitations 3x10 reps   External Rotation Strengthening;Left;Theraband  3x10 reps reports of discomfort   Theraband Level (Shoulder External Rotation) Level 2 (Red)   Internal Rotation Strengthening;Left;Theraband   Theraband Level (Shoulder Internal Rotation) Level 2 (Red)   Internal Rotation Limitations 3x10 reps   Extension Strengthening;Left;Theraband   Theraband Level (Shoulder Extension) Level 2 (Red)   Extension Limitations 3x10 reps     Shoulder Exercises: Pulleys   Flexion Other (comment)  x5 min     Shoulder Exercises: ROM/Strengthening   UBE (Upper Arm Bike) 90 RPM x 10 min     Modalities   Modalities Electrical Stimulation;Moist Heat     Moist Heat Therapy   Number Minutes Moist Heat 15 Minutes   Moist Heat Location Shoulder     Electrical Stimulation   Electrical Stimulation Location L shoulder   Electrical Stimulation Action IFC   Electrical Stimulation Parameters 1-10 hz x15 min   Electrical Stimulation Goals Pain                  PT Short Term Goals - 03/09/16 1047      PT SHORT TERM GOAL #1   Title Ind with initial HEP.   Time 2   Period Weeks   Status Achieved  PT Long Term Goals - 04/28/16 1423      PT LONG TERM GOAL #1   Title Ind with advanced HEP.   Time 8   Period Weeks   Status On-going     PT LONG TERM GOAL #2   Title Active left shoulder flexion to 135 degrees to assist with the accomplishment of functional activties.   Time 8   Period Weeks   Status Partially Met     PT LONG TERM GOAL #3   Title Active left shoulder ER= 80 degrees.   Time 8   Period Weeks   Status On-going     PT LONG TERM GOAL #4   Title Left shoulder ER strength= 4+/5.   Time 8   Period Weeks   Status Partially Met     PT LONG TERM GOAL #5   Title Perform ADL's with left shoulder pain not > 2-3/10.   Time 8   Period Weeks   Status Achieved     PT LONG TERM GOAL #6   Title Eliminate referred pain symptoms.   Time 8   Period Weeks   Status Achieved                Plan - 05/03/16 1105    Clinical Impression Statement Patient tolerated today's treatment fairly well as he experienced increase in L shoulder pain after UBE for longer time period as well as with resisted L shoulder ER with red theraband. Patient guided through exercises with moderate multimodal cueing for proper technique. Patient continues to have pain at the end of the day that intermittantly presents around L anterior shoulder at [facility] joint region. Normal modalities response noted following removal of the modalities.   Rehab Potential Excellent   PT Frequency 2x / week   PT Duration 8 weeks   PT Treatment/Interventions ADLs/Self Care Home Management;Cryotherapy;Electrical Stimulation;Ultrasound;Moist Heat;Therapeutic activities;Therapeutic exercise;Patient/family education;Passive range of motion;Manual techniques;Dry needling   PT Next Visit Plan Continue with L shoulder ROM and strengthening exercises with modalities PRN per MPT POC.    PT Home Exercise Plan HEP- Home pulley per VCs, RW4 with red theraband   Consulted and Agree with Plan of Care Patient      Patient will benefit from skilled therapeutic intervention in order to improve the following deficits and impairments:  Pain, Decreased activity tolerance, Decreased range of motion, Decreased strength  Visit Diagnosis: Stiffness of left shoulder, not elsewhere classified  Acute pain of left shoulder  Muscle weakness (generalized)     Problem List Patient Active Problem List   Diagnosis Date Noted  . Chronic left shoulder pain 04/20/2016  . Spinal stenosis 09/02/2015  . Numbness 09/02/2015  . Bradycardia 07/01/2014  . Thrombocytopenia ([place]) 06/30/2014  . CAP (community acquired pneumonia) 06/29/2014  . Hypoxia 06/29/2014  . HTN (hypertension) 06/29/2014  . Prostate cancer ([place]) 06/29/2014  . Hyponatremia 06/29/2014    Wynelle Fanny, PTA 05/03/2016, 11:57 AM  [facility] [address] [place], [place], [address] Phone: [phone]   Fax:  [phone]  Name: Thomas Vega MRN: [id number] Date of Birth: 12-May-1926

## 2016-05-05 ENCOUNTER — Ambulatory Visit: Payer: Medicare Other | Admitting: *Deleted

## 2016-05-05 DIAGNOSIS — M25612 Stiffness of left shoulder, not elsewhere classified: Secondary | ICD-10-CM | POA: Diagnosis not present

## 2016-05-05 DIAGNOSIS — M6281 Muscle weakness (generalized): Secondary | ICD-10-CM

## 2016-05-05 DIAGNOSIS — M25512 Pain in left shoulder: Secondary | ICD-10-CM

## 2016-05-05 NOTE — Therapy (Signed)
[facility] [place], [place], [address] Phone: [phone]   Fax:  [phone]  Physical Therapy Treatment  Patient Details  Name: Thomas Vega MRN: [id number] Date of Birth: 05/28/1926 Referring Provider: Meredith Pel MD  Encounter Date: 05/05/2016      PT End of Session - 05/05/16 1047    Visit Number 19   Number of Visits 28   Date for PT Re-Evaluation 06/03/16   PT Start Time 1030   PT Stop Time 1120   PT Time Calculation (min) 50 min      Past Medical History:  Diagnosis Date  . Abnormal finding of blood chemistry   . Bradycardia 07/01/2014  . CAP (community acquired pneumonia) 06/29/2014  . Heart murmur   . Irregular heart rate   . Melanoma ([place])   . Prostate cancer ([place])   . Thrombocytopenia ([place]) 06/30/2014    Past Surgical History:  Procedure Laterality Date  . BACK SURGERY    . CATARACT EXTRACTION    . KNEE SURGERY    . MELANOMA EXCISION      There were no vitals filed for this visit.      Subjective Assessment - 05/05/16 1040    Subjective Reports a tiny bit of L shoulder pain today.  Doing better    Patient Stated Goals Use my left UE without pain.   Currently in Pain? Yes   Pain Score 4    Pain Location Shoulder   Pain Orientation Left   Pain Descriptors / Indicators Nagging   Pain Type Acute pain   Pain Onset More than a month ago   Pain Frequency Intermittent                         [facility] Adult PT Treatment/Exercise - 05/05/16 0001      Shoulder Exercises: Seated   Flexion Strengthening;Both;Weights   Flexion Weight (lbs) 1   Flexion Limitations 3x10 reps     Shoulder Exercises: Standing   Protraction Strengthening;Left;Theraband   Theraband Level (Shoulder Protraction) Level 2 (Red)   Protraction Limitations 3x10 reps   External Rotation Strengthening;Left;Theraband  3x10 reps reports of discomfort   Theraband Level (Shoulder External Rotation) Level 2  (Red)   Internal Rotation Strengthening;Left;Theraband   Theraband Level (Shoulder Internal Rotation) Level 2 (Red)   Internal Rotation Limitations 3x10 reps   Extension Strengthening;Left;Theraband   Theraband Level (Shoulder Extension) Level 2 (Red)   Extension Limitations 3x10 reps   Row Strengthening;Left;Theraband;20 reps     Shoulder Exercises: Pulleys   Flexion Other (comment)  x5 min   Other Pulley Exercises UE Ranger on wall x 5 minutes.     Shoulder Exercises: ROM/Strengthening   UBE (Upper Arm Bike) 90 RPM x 10 min     Modalities   Modalities Electrical Stimulation;Moist Heat     Moist Heat Therapy   Number Minutes Moist Heat 15 Minutes   Moist Heat Location Shoulder     Electrical Stimulation   Electrical Stimulation Location L shoulder/ UT IFC  x 15 mins   80-'150hz'$   Left shoulder.   Electrical Stimulation Goals Pain                  PT Short Term Goals - 03/09/16 1047      PT SHORT TERM GOAL #1   Title Ind with initial HEP.   Time 2   Period Weeks   Status Achieved  PT Long Term Goals - 04/28/16 1423      PT LONG TERM GOAL #1   Title Ind with advanced HEP.   Time 8   Period Weeks   Status On-going     PT LONG TERM GOAL #2   Title Active left shoulder flexion to 135 degrees to assist with the accomplishment of functional activties.   Time 8   Period Weeks   Status Partially Met     PT LONG TERM GOAL #3   Title Active left shoulder ER= 80 degrees.   Time 8   Period Weeks   Status On-going     PT LONG TERM GOAL #4   Title Left shoulder ER strength= 4+/5.   Time 8   Period Weeks   Status Partially Met     PT LONG TERM GOAL #5   Title Perform ADL's with left shoulder pain not > 2-3/10.   Time 8   Period Weeks   Status Achieved     PT LONG TERM GOAL #6   Title Eliminate referred pain symptoms.   Time 8   Period Weeks   Status Achieved               Plan - 05/05/16 1050    Clinical Impression  Statement Pt arrives to clinic today with miminimal pain 2-3/10, but has increased soreness after Therex. overall he reports 70-75% improvement in LT shldr. He continues to have some pain and soreness along LT ACJ. He was able to elevate LT UE to 130 degrees in standing  today , but unable to meet LTG of 135 degrees.      Patient will benefit from skilled therapeutic intervention in order to improve the following deficits and impairments:     Visit Diagnosis: Stiffness of left shoulder, not elsewhere classified  Acute pain of left shoulder  Muscle weakness (generalized)     Problem List Patient Active Problem List   Diagnosis Date Noted  . Chronic left shoulder pain 04/20/2016  . Spinal stenosis 09/02/2015  . Numbness 09/02/2015  . Bradycardia 07/01/2014  . Thrombocytopenia ([place]) 06/30/2014  . CAP (community acquired pneumonia) 06/29/2014  . Hypoxia 06/29/2014  . HTN (hypertension) 06/29/2014  . Prostate cancer ([place]) 06/29/2014  . Hyponatremia 06/29/2014    Thomas Vega,Thomas Vega, Thomas Vega 05/05/2016, 1:18 PM  [facility] [address] [place], [place], [address] Phone: [phone]   Fax:  [phone]  Name: Thomas Vega MRN: [id number] Date of Birth: 06/13/1926

## 2016-05-05 NOTE — Therapy (Signed)
[facility] [place], [place], [address] Phone: [phone]   Fax:  [phone]  Physical Therapy Treatment  Patient Details  Name: Thomas Vega MRN: [id number] Date of Birth: 06-Sep-1926 Referring Provider: Meredith Pel MD  Encounter Date: 04/28/2016      PT End of Session - 05/05/16 1047    Visit Number 19   Number of Visits 28   Date for PT Re-Evaluation 06/03/16   PT Start Time 1030   PT Stop Time 1120   PT Time Calculation (min) 50 min      Past Medical History:  Diagnosis Date  . Abnormal finding of blood chemistry   . Bradycardia 07/01/2014  . CAP (community acquired pneumonia) 06/29/2014  . Heart murmur   . Irregular heart rate   . Melanoma ([place])   . Prostate cancer ([place])   . Thrombocytopenia ([place]) 06/30/2014    Past Surgical History:  Procedure Laterality Date  . BACK SURGERY    . CATARACT EXTRACTION    . KNEE SURGERY    . MELANOMA EXCISION      There were no vitals filed for this visit.      Subjective Assessment - 05/05/16 1040    Subjective Reports a tiny bit of L shoulder pain today.  Doing better    Patient Stated Goals Use my left UE without pain.   Currently in Pain? Yes   Pain Score 4    Pain Location Shoulder   Pain Orientation Left   Pain Descriptors / Indicators Nagging   Pain Type Acute pain   Pain Onset More than a month ago   Pain Frequency Intermittent                         [facility] Adult PT Treatment/Exercise - 05/05/16 0001      Shoulder Exercises: Seated   Flexion Strengthening;Both;Weights   Flexion Weight (lbs) 1   Flexion Limitations 3x10 reps     Shoulder Exercises: Standing   Protraction Strengthening;Left;Theraband   Theraband Level (Shoulder Protraction) Level 2 (Red)   Protraction Limitations 3x10 reps   External Rotation Strengthening;Left;Theraband  3x10 reps reports of discomfort   Theraband Level (Shoulder External Rotation) Level 2  (Red)   Internal Rotation Strengthening;Left;Theraband   Theraband Level (Shoulder Internal Rotation) Level 2 (Red)   Internal Rotation Limitations 3x10 reps   Extension Strengthening;Left;Theraband   Theraband Level (Shoulder Extension) Level 2 (Red)   Extension Limitations 3x10 reps   Row Strengthening;Left;Theraband;20 reps     Shoulder Exercises: Pulleys   Flexion Other (comment)  x5 min   Other Pulley Exercises UE Ranger on wall x 5 minutes.     Shoulder Exercises: ROM/Strengthening   UBE (Upper Arm Bike) 90 RPM x 10 min     Modalities   Modalities Electrical Stimulation;Moist Heat     Moist Heat Therapy   Number Minutes Moist Heat 15 Minutes   Moist Heat Location Shoulder     Electrical Stimulation   Electrical Stimulation Location L shoulder/ UT IFC  x 15 mins   80-'150hz'$   Left shoulder.   Electrical Stimulation Goals Pain                  PT Short Term Goals - 03/09/16 1047      PT SHORT TERM GOAL #1   Title Ind with initial HEP.   Time 2   Period Weeks   Status Achieved  PT Long Term Goals - 04/28/16 1423      PT LONG TERM GOAL #1   Title Ind with advanced HEP.   Time 8   Period Weeks   Status On-going     PT LONG TERM GOAL #2   Title Active left shoulder flexion to 135 degrees to assist with the accomplishment of functional activties.   Time 8   Period Weeks   Status Partially Met     PT LONG TERM GOAL #3   Title Active left shoulder ER= 80 degrees.   Time 8   Period Weeks   Status On-going     PT LONG TERM GOAL #4   Title Left shoulder ER strength= 4+/5.   Time 8   Period Weeks   Status Partially Met     PT LONG TERM GOAL #5   Title Perform ADL's with left shoulder pain not > 2-3/10.   Time 8   Period Weeks   Status Achieved     PT LONG TERM GOAL #6   Title Eliminate referred pain symptoms.   Time 8   Period Weeks   Status Achieved               Plan - 05/05/16 1050    Clinical Impression  Statement Pt arrives to clinic today with miminimal pain 2-3/10, but has increased soreness after Therex. overall he reports 70-75% improvement in LT shldr. He continues to have some pain and soreness along LT ACJ. He was able to elevate LT UE to 130 degrees in standing  today , but unable to meet LTG of 135 degrees.      Patient will benefit from skilled therapeutic intervention in order to improve the following deficits and impairments:  Pain, Decreased activity tolerance, Decreased range of motion, Decreased strength  Visit Diagnosis: Stiffness of left shoulder, not elsewhere classified  Acute pain of left shoulder  Muscle weakness (generalized)     Problem List Patient Active Problem List   Diagnosis Date Noted  . Chronic left shoulder pain 04/20/2016  . Spinal stenosis 09/02/2015  . Numbness 09/02/2015  . Bradycardia 07/01/2014  . Thrombocytopenia ([place]) 06/30/2014  . CAP (community acquired pneumonia) 06/29/2014  . Hypoxia 06/29/2014  . HTN (hypertension) 06/29/2014  . Prostate cancer ([place]) 06/29/2014  . Hyponatremia 06/29/2014    Thomas Vega,CHRIS 05/05/2016, 1:23 PM  [facility] [place], [place], [address] Phone: [phone]   Fax:  [phone]  Name: Thomas Vega MRN: [id number] Date of Birth: June 23, 1926

## 2016-05-10 ENCOUNTER — Encounter: Payer: Medicare Other | Admitting: *Deleted

## 2016-05-12 ENCOUNTER — Ambulatory Visit: Payer: Medicare Other | Admitting: *Deleted

## 2016-05-12 DIAGNOSIS — M6281 Muscle weakness (generalized): Secondary | ICD-10-CM | POA: Diagnosis not present

## 2016-05-12 DIAGNOSIS — M25512 Pain in left shoulder: Secondary | ICD-10-CM | POA: Diagnosis not present

## 2016-05-12 DIAGNOSIS — M25612 Stiffness of left shoulder, not elsewhere classified: Secondary | ICD-10-CM | POA: Diagnosis not present

## 2016-05-12 NOTE — Therapy (Signed)
[facility] [place], [place], [address] Phone: [phone]   Fax:  [phone]  Physical Therapy Treatment  Patient Details  Name: Thomas Vega MRN: [id number] Date of Birth: 10-12-1926 Referring Provider: Meredith Pel MD  Encounter Date: 05/12/2016      PT End of Session - 05/12/16 1046    Visit Number 20   Number of Visits 28   Date for PT Re-Evaluation 06/03/16   Authorization Type Gcode 20 visit   PT Start Time 1030   PT Stop Time 1120   PT Time Calculation (min) 50 min      Past Medical History:  Diagnosis Date  . Abnormal finding of blood chemistry   . Bradycardia 07/01/2014  . CAP (community acquired pneumonia) 06/29/2014  . Heart murmur   . Irregular heart rate   . Melanoma ([place])   . Prostate cancer ([place])   . Thrombocytopenia ([place]) 06/30/2014    Past Surgical History:  Procedure Laterality Date  . BACK SURGERY    . CATARACT EXTRACTION    . KNEE SURGERY    . MELANOMA EXCISION      There were no vitals filed for this visit.                       [facility] Adult PT Treatment/Exercise - 05/12/16 0001      Exercises   Exercises Shoulder     Shoulder Exercises: Standing   Protraction Strengthening;Left;Theraband   Theraband Level (Shoulder Protraction) Level 2 (Red)   Protraction Limitations 3x10 reps   External Rotation Strengthening;Left;Theraband  3x10 reps reports of discomfort   Theraband Level (Shoulder External Rotation) Level 2 (Red)   Internal Rotation Strengthening;Left;Theraband   Theraband Level (Shoulder Internal Rotation) Level 2 (Red)   Internal Rotation Limitations 3x10 reps   Flexion Left;20 reps;10 reps   Shoulder Flexion Weight (lbs) 1   Extension Strengthening;Left;Theraband   Theraband Level (Shoulder Extension) Level 2 (Red)   Extension Limitations 3x10 reps   Row Strengthening;Left;Theraband;20 reps     Shoulder Exercises: Pulleys   Flexion Other  (comment)  x5 min   Other Pulley Exercises UE Ranger on wall x 5 minutes.     Shoulder Exercises: ROM/Strengthening   UBE (Upper Arm Bike) 90 RPM x 10 min     Modalities   Modalities Electrical Stimulation;Moist Heat     Moist Heat Therapy   Number Minutes Moist Heat 15 Minutes   Moist Heat Location Shoulder     Electrical Stimulation   Electrical Stimulation Location L shoulder/ UT IFC  x 15 mins   80-150hz  Left shoulder.   Electrical Stimulation Goals Pain                  PT Short Term Goals - 03/09/16 1047      PT SHORT TERM GOAL #1   Title Ind with initial HEP.   Time 2   Period Weeks   Status Achieved           PT Long Term Goals - 04/28/16 1423      PT LONG TERM GOAL #1   Title Ind with advanced HEP.   Time 8   Period Weeks   Status On-going     PT LONG TERM GOAL #2   Title Active left shoulder flexion to 135 degrees to assist with the accomplishment of functional activties.   Time 8   Period Weeks   Status Partially Met  PT LONG TERM GOAL #3   Title Active left shoulder ER= 80 degrees.   Time 8   Period Weeks   Status On-going     PT LONG TERM GOAL #4   Title Left shoulder ER strength= 4+/5.   Time 8   Period Weeks   Status Partially Met     PT LONG TERM GOAL #5   Title Perform ADL's with left shoulder pain not > 2-3/10.   Time 8   Period Weeks   Status Achieved     PT LONG TERM GOAL #6   Title Eliminate referred pain symptoms.   Time 8   Period Weeks   Status Achieved               Plan - 05/12/16 1054    Clinical Impression Statement Pt arrived to [facility] today with less LT shldr pain 2/10. He did well with Therex again, but continues to have LT ACJ soreness and pain. He is still 70-75% better overall and was able to reach 130 degrees in AROM and 135 degrees in PROM.   Rehab Potential Excellent   PT Frequency 2x / week   PT Duration 8 weeks   PT Next Visit Plan Continue with L shoulder ROM and strengthening  exercises with modalities PRN per MPT POC.    PT Home Exercise Plan HEP- Home pulley per VCs, RW4 with red theraband   Consulted and Agree with Plan of Care Patient      Patient will benefit from skilled therapeutic intervention in order to improve the following deficits and impairments:  Pain, Decreased activity tolerance, Decreased range of motion, Decreased strength  Visit Diagnosis: Stiffness of left shoulder, not elsewhere classified  Acute pain of left shoulder  Muscle weakness (generalized)     Problem List Patient Active Problem List   Diagnosis Date Noted  . Chronic left shoulder pain 04/20/2016  . Spinal stenosis 09/02/2015  . Numbness 09/02/2015  . Bradycardia 07/01/2014  . Thrombocytopenia ([place]) 06/30/2014  . CAP (community acquired pneumonia) 06/29/2014  . Hypoxia 06/29/2014  . HTN (hypertension) 06/29/2014  . Prostate cancer ([place]) 06/29/2014  . Hyponatremia 06/29/2014    Zitlali Primm,CHRIS, PTA 05/12/2016, 11:23 AM  [facility] [place], [place], [address] Phone: [phone]   Fax:  [phone]  Name: Thomas Vega MRN: [id number] Date of Birth: 1926-08-06

## 2016-05-17 ENCOUNTER — Ambulatory Visit: Payer: Medicare Other | Admitting: *Deleted

## 2016-05-17 DIAGNOSIS — M6281 Muscle weakness (generalized): Secondary | ICD-10-CM | POA: Diagnosis not present

## 2016-05-17 DIAGNOSIS — M25612 Stiffness of left shoulder, not elsewhere classified: Secondary | ICD-10-CM | POA: Diagnosis not present

## 2016-05-17 DIAGNOSIS — M25512 Pain in left shoulder: Secondary | ICD-10-CM

## 2016-05-17 NOTE — Therapy (Signed)
[facility] [place], [place], [address] Phone: [phone]   Fax:  [phone]  Physical Therapy Treatment  Patient Details  Name: Thomas Vega MRN: [id number] Date of Birth: 1927/03/01 Referring Provider: Meredith Pel MD  Encounter Date: 05/17/2016      PT End of Session - 05/17/16 1306    Visit Number 21   Number of Visits 28   Date for PT Re-Evaluation 06/03/16   Authorization Type Gcode 20 visit   Activity Tolerance Patient tolerated treatment well   Behavior During Therapy [facility] for tasks assessed/performed      Past Medical History:  Diagnosis Date  . Abnormal finding of blood chemistry   . Bradycardia 07/01/2014  . CAP (community acquired pneumonia) 06/29/2014  . Heart murmur   . Irregular heart rate   . Melanoma ([place])   . Prostate cancer ([place])   . Thrombocytopenia ([place]) 06/30/2014    Past Surgical History:  Procedure Laterality Date  . BACK SURGERY    . CATARACT EXTRACTION    . KNEE SURGERY    . MELANOMA EXCISION      There were no vitals filed for this visit.      Subjective Assessment - 05/17/16 1048    Subjective Reports a tiny bit of L shoulder pain today over the weekend   Patient Stated Goals Use my left UE without pain.   Currently in Pain? Yes   Pain Score 4    Pain Location Shoulder   Pain Orientation Left   Pain Descriptors / Indicators Nagging   Pain Type Acute pain   Pain Onset More than a month ago   Pain Frequency Intermittent                         [facility] Adult PT Treatment/Exercise - 05/17/16 0001      Exercises   Exercises Shoulder     Shoulder Exercises: Standing   Internal Rotation Limitations 3x10 reps   Flexion Left;20 reps;10 reps   Shoulder Flexion Weight (lbs) 1   Extension --   Theraband Level (Shoulder Extension) --   Extension Limitations --   Row --     Shoulder Exercises: Pulleys   Flexion Other (comment)  x5 min   Other Pulley  Exercises UE Ranger on wall x 5 minutes.     Shoulder Exercises: ROM/Strengthening   UBE (Upper Arm Bike) 90 RPM x 10 min     Modalities   Modalities Electrical Stimulation;Moist Heat;Ultrasound     Moist Heat Therapy   Number Minutes Moist Heat 15 Minutes   Moist Heat Location Shoulder     Electrical Stimulation   Electrical Stimulation Location L shoulder/ UT IFC  x 15 mins   80-'150hz'$   Left shoulder.   Electrical Stimulation Goals Pain     Ultrasound   Ultrasound Location LT shldr   Ultrasound Parameters 1.5w/cm2 x 8 mins   Ultrasound Goals Pain                  PT Short Term Goals - 03/09/16 1047      PT SHORT TERM GOAL #1   Title Ind with initial HEP.   Time 2   Period Weeks   Status Achieved           PT Long Term Goals - 04/28/16 1423      PT LONG TERM GOAL #1   Title Ind with advanced HEP.   Time  8   Period Weeks   Status On-going     PT LONG TERM GOAL #2   Title Active left shoulder flexion to 135 degrees to assist with the accomplishment of functional activties.   Time 8   Period Weeks   Status Partially Met     PT LONG TERM GOAL #3   Title Active left shoulder ER= 80 degrees.   Time 8   Period Weeks   Status On-going     PT LONG TERM GOAL #4   Title Left shoulder ER strength= 4+/5.   Time 8   Period Weeks   Status Partially Met     PT LONG TERM GOAL #5   Title Perform ADL's with left shoulder pain not > 2-3/10.   Time 8   Period Weeks   Status Achieved     PT LONG TERM GOAL #6   Title Eliminate referred pain symptoms.   Time 8   Period Weeks   Status Achieved               Plan - 05/17/16 1309    Clinical Impression Statement Pt did fairly well today with Therex and [place] was added to help with increased discomfort in LT shldr. Pt did well overall and had less pain end of Rx. LTGs are ongoing at this time.   Rehab Potential Excellent   PT Frequency 2x / week   PT Duration 8 weeks   PT Treatment/Interventions  ADLs/Self Care Home Management;Cryotherapy;Electrical Stimulation;Ultrasound;Moist Heat;Therapeutic activities;Therapeutic exercise;Patient/family education;Passive range of motion;Manual techniques;Dry needling   PT Next Visit Plan Continue with L shoulder ROM and strengthening exercises with modalities PRN per MPT POC.    PT Home Exercise Plan HEP- Home pulley per VCs, RW4 with red theraband   Consulted and Agree with Plan of Care Patient      Patient will benefit from skilled therapeutic intervention in order to improve the following deficits and impairments:  Pain, Decreased activity tolerance, Decreased range of motion, Decreased strength  Visit Diagnosis: Stiffness of left shoulder, not elsewhere classified  Acute pain of left shoulder  Muscle weakness (generalized)     Problem List Patient Active Problem List   Diagnosis Date Noted  . Chronic left shoulder pain 04/20/2016  . Spinal stenosis 09/02/2015  . Numbness 09/02/2015  . Bradycardia 07/01/2014  . Thrombocytopenia ([place]) 06/30/2014  . CAP (community acquired pneumonia) 06/29/2014  . Hypoxia 06/29/2014  . HTN (hypertension) 06/29/2014  . Prostate cancer ([place]) 06/29/2014  . Hyponatremia 06/29/2014    RAMSEUR,CHRIS, PTA 05/17/2016, 1:16 PM  [facility] [place], [place], [address] Phone: [phone]   Fax:  [phone]  Name: Thomas Vega MRN: [id number] Date of Birth: 10-12-1926

## 2016-05-19 ENCOUNTER — Ambulatory Visit: Payer: Medicare Other | Admitting: *Deleted

## 2016-05-19 DIAGNOSIS — M6281 Muscle weakness (generalized): Secondary | ICD-10-CM | POA: Diagnosis not present

## 2016-05-19 DIAGNOSIS — M25512 Pain in left shoulder: Secondary | ICD-10-CM | POA: Diagnosis not present

## 2016-05-19 DIAGNOSIS — M25612 Stiffness of left shoulder, not elsewhere classified: Secondary | ICD-10-CM | POA: Diagnosis not present

## 2016-05-19 NOTE — Therapy (Signed)
[facility] [place], [place], [address] Phone: [phone]   Fax:  [phone]  Physical Therapy Treatment  Patient Details  Name: Thomas Vega MRN: [id number] Date of Birth: March 06, 1927 Referring Provider: Meredith Pel MD  Encounter Date: 05/19/2016      PT End of Session - 05/19/16 1058    Visit Number 22   Number of Visits 28   Date for PT Re-Evaluation 06/03/16   PT Start Time 7510   PT Stop Time 1050   PT Time Calculation (min) 35 min      Past Medical History:  Diagnosis Date  . Abnormal finding of blood chemistry   . Bradycardia 07/01/2014  . CAP (community acquired pneumonia) 06/29/2014  . Heart murmur   . Irregular heart rate   . Melanoma ([place])   . Prostate cancer ([place])   . Thrombocytopenia ([place]) 06/30/2014    Past Surgical History:  Procedure Laterality Date  . BACK SURGERY    . CATARACT EXTRACTION    . KNEE SURGERY    . MELANOMA EXCISION      There were no vitals filed for this visit.      Subjective Assessment - 05/19/16 1049    Subjective Doing better today with less shldr pain. Need to leave early today.    Patient Stated Goals Use my left UE without pain.                         [facility] Adult PT Treatment/Exercise - 05/19/16 0001      Exercises   Exercises Shoulder     Shoulder Exercises: Standing   Flexion Left;20 reps;10 reps  3x10   Shoulder Flexion Weight (lbs) 1     Shoulder Exercises: Pulleys   Flexion Other (comment)   Other Pulley Exercises UE Ranger on wall x 5 minutes.     Shoulder Exercises: ROM/Strengthening   UBE (Upper Arm Bike) 90 RPM x 10 min     Moist Heat Therapy   Number Minutes Moist Heat 10 Minutes   Moist Heat Location Shoulder     Electrical Stimulation   Electrical Stimulation Location L shoulder/ UT IFC  x 10 mins   80-_0    Electrical Stimulation Goals Pain                  PT Short Term Goals - 03/09/16 1047       PT SHORT TERM GOAL #1   Title Ind with initial HEP.   Time 2   Period Weeks   Status Achieved           PT Long Term Goals - 04/28/16 1423      PT LONG TERM GOAL #1   Title Ind with advanced HEP.   Time 8   Period Weeks   Status On-going     PT LONG TERM GOAL #2   Title Active left shoulder flexion to 135 degrees to assist with the accomplishment of functional activties.   Time 8   Period Weeks   Status Partially Met     PT LONG TERM GOAL #3   Title Active left shoulder ER= 80 degrees.   Time 8   Period Weeks   Status On-going     PT LONG TERM GOAL #4   Title Left shoulder ER strength= 4+/5.   Time 8   Period Weeks   Status Partially Met     PT LONG TERM GOAL #5  Title Perform ADL's with left shoulder pain not > 2-3/10.   Time 8   Period Weeks   Status Achieved     PT LONG TERM GOAL #6   Title Eliminate referred pain symptoms.   Time 8   Period Weeks   Status Achieved               Plan - 05/19/16 1101    Clinical Impression Statement Pt arrived to clinic today with decreased soreness in LT shldr after last Rx. He also needed to leave early today due to another appt. He did well with therex and had a normal response to modalities. Unable to meet LTG for AROM for flexion of 135 degrees, he had 130 degrees today and is ongoing due to deficits   Rehab Potential Excellent   PT Frequency 2x / week   PT Duration 8 weeks   PT Treatment/Interventions ADLs/Self Care Home Management;Cryotherapy;Electrical Stimulation;Ultrasound;Moist Heat;Therapeutic activities;Therapeutic exercise;Patient/family education;Passive range of motion;Manual techniques;Dry needling   PT Next Visit Plan Continue with L shoulder ROM and strengthening exercises with modalities PRN per MPT POC.    PT Home Exercise Plan HEP- Home pulley per VCs, RW4 with red theraband   Consulted and Agree with Plan of Care Patient      Patient will benefit from skilled therapeutic  intervention in order to improve the following deficits and impairments:  Pain, Decreased activity tolerance, Decreased range of motion, Decreased strength  Visit Diagnosis: Stiffness of left shoulder, not elsewhere classified  Muscle weakness (generalized)  Acute pain of left shoulder     Problem List Patient Active Problem List   Diagnosis Date Noted  . Chronic left shoulder pain 04/20/2016  . Spinal stenosis 09/02/2015  . Numbness 09/02/2015  . Bradycardia 07/01/2014  . Thrombocytopenia ([place]) 06/30/2014  . CAP (community acquired pneumonia) 06/29/2014  . Hypoxia 06/29/2014  . HTN (hypertension) 06/29/2014  . Prostate cancer ([place]) 06/29/2014  . Hyponatremia 06/29/2014    RAMSEUR,CHRIS, PTA 05/19/2016, 11:08 AM  [facility] [place], [place], [address] Phone: [phone]   Fax:  [phone]  Name: Thomas Vega MRN: [id number] Date of Birth: Aug 09, 1926

## 2016-05-24 ENCOUNTER — Ambulatory Visit: Payer: Medicare Other | Admitting: *Deleted

## 2016-05-24 DIAGNOSIS — M25512 Pain in left shoulder: Secondary | ICD-10-CM | POA: Diagnosis not present

## 2016-05-24 DIAGNOSIS — M25612 Stiffness of left shoulder, not elsewhere classified: Secondary | ICD-10-CM

## 2016-05-24 DIAGNOSIS — M6281 Muscle weakness (generalized): Secondary | ICD-10-CM | POA: Diagnosis not present

## 2016-05-24 NOTE — Therapy (Signed)
[facility] [place], [place], [address] Phone: [phone]   Fax:  [phone]  Physical Therapy Treatment  Patient Details  Name: Thomas Vega MRN: [id number] Date of Birth: [birth year] Referring Provider: Meredith Pel MD  Encounter Date: 05/24/2016      PT End of Session - 05/24/16 1055    Visit Number 23   Number of Visits 28   Date for PT Re-Evaluation 06/03/16   PT Start Time 1030   PT Stop Time 1120   PT Time Calculation (min) 50 min      Past Medical History:  Diagnosis Date  . Abnormal finding of blood chemistry   . Bradycardia 07/01/2014  . CAP (community acquired pneumonia) 06/29/2014  . Heart murmur   . Irregular heart rate   . Melanoma ([place])   . Prostate cancer ([place])   . Thrombocytopenia ([place]) 06/30/2014    Past Surgical History:  Procedure Laterality Date  . BACK SURGERY    . CATARACT EXTRACTION    . KNEE SURGERY    . MELANOMA EXCISION      There were no vitals filed for this visit.                       [facility] Adult PT Treatment/Exercise - 05/24/16 0001      Exercises   Exercises Shoulder     Shoulder Exercises: Standing   Protraction Strengthening;Left;Theraband   Theraband Level (Shoulder Protraction) Level 2 (Red)   Protraction Limitations 3x10 reps   External Rotation Strengthening;Left;Theraband  3x10 reps reports of discomfort   Theraband Level (Shoulder External Rotation) Level 2 (Red)   Internal Rotation Strengthening;Left;Theraband   Theraband Level (Shoulder Internal Rotation) Level 2 (Red)   Flexion Left;20 reps;10 reps  3x10   Shoulder Flexion Weight (lbs) 1   Row Strengthening;Left;Theraband;20 reps;10 reps     Shoulder Exercises: Pulleys   Flexion Other (comment)   Other Pulley Exercises UE Ranger on wall x 5 minutes.     Shoulder Exercises: ROM/Strengthening   UBE (Upper Arm Bike) 90 RPM x 10 min     Modalities   Modalities Electrical  Stimulation;Moist Heat;Ultrasound     Moist Heat Therapy   Number Minutes Moist Heat 15 Minutes   Moist Heat Location Shoulder     Electrical Stimulation   Electrical Stimulation Location L shoulder/ UT IFC  x 10 mins   80-'150hz'$    Electrical Stimulation Goals Pain                  PT Short Term Goals - 03/09/16 1047      PT SHORT TERM GOAL #1   Title Ind with initial HEP.   Time 2   Period Weeks   Status Achieved           PT Long Term Goals - 04/28/16 1423      PT LONG TERM GOAL #1   Title Ind with advanced HEP.   Time 8   Period Weeks   Status On-going     PT LONG TERM GOAL #2   Title Active left shoulder flexion to 135 degrees to assist with the accomplishment of functional activties.   Time 8   Period Weeks   Status Partially Met     PT LONG TERM GOAL #3   Title Active left shoulder ER= 80 degrees.   Time 8   Period Weeks   Status On-going     PT LONG TERM  GOAL #4   Title Left shoulder ER strength= 4+/5.   Time 8   Period Weeks   Status Partially Met     PT LONG TERM GOAL #5   Title Perform ADL's with left shoulder pain not > 2-3/10.   Time 8   Period Weeks   Status Achieved     PT LONG TERM GOAL #6   Title Eliminate referred pain symptoms.   Time 8   Period Weeks   Status Achieved               Plan - 05/24/16 1208    Clinical Impression Statement Pt arrived to clinic today with decreased pain in Lt shldr and was feeling better. He was able to perform all Therex for LT ROM/ strengthening with mild pain increase, but was unable to reach AROM goals yet due to mild deficits. Flexion 130 and ER 70 degrees. and LTGs are ongoing   Rehab Potential Excellent   PT Frequency 2x / week   PT Duration 8 weeks   PT Treatment/Interventions ADLs/Self Care Home Management;Cryotherapy;Electrical Stimulation;Ultrasound;Moist Heat;Therapeutic activities;Therapeutic exercise;Patient/family education;Passive range of motion;Manual techniques;Dry  needling   PT Next Visit Plan Continue with L shoulder ROM and strengthening exercises with modalities PRN per MPT POC.    PT Home Exercise Plan HEP- Home pulley per VCs, RW4 with red theraband   Consulted and Agree with Plan of Care Patient      Patient will benefit from skilled therapeutic intervention in order to improve the following deficits and impairments:  Pain, Decreased activity tolerance, Decreased range of motion, Decreased strength  Visit Diagnosis: Stiffness of left shoulder, not elsewhere classified  Muscle weakness (generalized)  Acute pain of left shoulder     Problem List Patient Active Problem List   Diagnosis Date Noted  . Chronic left shoulder pain 04/20/2016  . Spinal stenosis 09/02/2015  . Numbness 09/02/2015  . Bradycardia 07/01/2014  . Thrombocytopenia ([place]) 06/30/2014  . CAP (community acquired pneumonia) 06/29/2014  . Hypoxia 06/29/2014  . HTN (hypertension) 06/29/2014  . Prostate cancer ([place]) 06/29/2014  . Hyponatremia 06/29/2014    RAMSEUR,CHRIS, PTA 05/24/2016, 12:11 PM  [facility] [place], [place], [address] Phone: [phone]   Fax:  [phone]  Name: Thomas Vega MRN: [id number] Date of Birth: 07/15/1926

## 2016-05-26 ENCOUNTER — Ambulatory Visit: Payer: Medicare Other | Attending: Orthopedic Surgery | Admitting: *Deleted

## 2016-05-26 DIAGNOSIS — M25612 Stiffness of left shoulder, not elsewhere classified: Secondary | ICD-10-CM | POA: Insufficient documentation

## 2016-05-26 DIAGNOSIS — M25512 Pain in left shoulder: Secondary | ICD-10-CM | POA: Diagnosis not present

## 2016-05-26 DIAGNOSIS — M6281 Muscle weakness (generalized): Secondary | ICD-10-CM | POA: Insufficient documentation

## 2016-05-26 NOTE — Therapy (Signed)
[facility] [place], [place], [address] Phone: [phone]   Fax:  [phone]  Physical Therapy Treatment  Patient Details  Name: Thomas Vega MRN: [id number] Date of Birth: [birth year] Referring Provider: Meredith Pel MD  Encounter Date: 05/26/2016      PT End of Session - 05/26/16 1121    Visit Number 24   Number of Visits 28   Date for PT Re-Evaluation 06/03/16   PT Start Time 1115   PT Stop Time 6962   PT Time Calculation (min) 50 min      Past Medical History:  Diagnosis Date  . Abnormal finding of blood chemistry   . Bradycardia 07/01/2014  . CAP (community acquired pneumonia) 06/29/2014  . Heart murmur   . Irregular heart rate   . Melanoma ([place])   . Prostate cancer ([place])   . Thrombocytopenia ([place]) 06/30/2014    Past Surgical History:  Procedure Laterality Date  . BACK SURGERY    . CATARACT EXTRACTION    . KNEE SURGERY    . MELANOMA EXCISION      There were no vitals filed for this visit.      Subjective Assessment - 05/26/16 1119    Subjective Doing better today with less shldr pain. DC today   Patient Stated Goals Use my left UE without pain.   Currently in Pain? Yes   Pain Score 2    Pain Orientation Left   Pain Descriptors / Indicators Sore   Pain Type Acute pain   Pain Onset More than a month ago   Pain Frequency Intermittent                         [facility] Adult PT Treatment/Exercise - 05/26/16 0001      Exercises   Exercises Shoulder     Shoulder Exercises: Standing   Protraction Strengthening;Left;Theraband   Theraband Level (Shoulder Protraction) Level 2 (Red)   Protraction Limitations 3x10 reps   External Rotation Strengthening;Left;Theraband  3x10 reps reports of discomfort   Theraband Level (Shoulder External Rotation) Level 2 (Red)   Internal Rotation Strengthening;Left;Theraband   Theraband Level (Shoulder Internal Rotation) Level 2 (Red)   Flexion  Left;20 reps;10 reps  3x10   Shoulder Flexion Weight (lbs) 1   Row Strengthening;Left;Theraband;20 reps;10 reps     Shoulder Exercises: Pulleys   Flexion Other (comment)   Other Pulley Exercises UE Ranger on wall x 5 minutes.     Shoulder Exercises: ROM/Strengthening   UBE (Upper Arm Bike) 90 RPM x 10 min     Modalities   Modalities Electrical Stimulation;Moist Heat;Ultrasound     Moist Heat Therapy   Number Minutes Moist Heat 15 Minutes   Moist Heat Location Shoulder     Electrical Stimulation   Electrical Stimulation Location L shoulder/ UT IFC  x 10 mins   80-_0    Electrical Stimulation Goals Pain                  PT Short Term Goals - 03/09/16 1047      PT SHORT TERM GOAL #1   Title Ind with initial HEP.   Time 2   Period Weeks   Status Achieved           PT Long Term Goals - 05/26/16 1134      PT LONG TERM GOAL #1   Title Ind with advanced HEP.   Time 8   Period Weeks  Status Achieved     PT LONG TERM GOAL #2   Title Active left shoulder flexion to 135 degrees to assist with the accomplishment of functional activties.   Time 8   Period Weeks   Status Achieved     PT LONG TERM GOAL #3   Title Active left shoulder ER= 80 degrees.   Time 8   Period Weeks   Status Not Met  NM 72 degrees     PT LONG TERM GOAL #4   Title Left shoulder ER strength= 4+/5.   Period Weeks   Status Achieved     PT LONG TERM GOAL #5   Title Perform ADL's with left shoulder pain not > 2-3/10.   Period Weeks   Status Achieved               Plan - 05/26/16 1133    Clinical Impression Statement Pt arrived today with minimal pain in LT shldr and feels he is ready to DC from PT. He has MET 4/5 LTGs. He was aunable to meet LTG for ROM for ER of 80 degrees due to ROM deficit of 70 degrees.   Rehab Potential Excellent   PT Duration --   PT Treatment/Interventions ADLs/Self Care Home Management;Cryotherapy;Electrical Stimulation;Ultrasound;Moist  Heat;Therapeutic activities;Therapeutic exercise;Patient/family education;Passive range of motion;Manual techniques;Dry needling   PT Next Visit Plan DC to HEP   PT Home Exercise Plan HEP- Home pulley per VCs, RW4 with red theraband   Consulted and Agree with Plan of Care Patient      Patient will benefit from skilled therapeutic intervention in order to improve the following deficits and impairments:  Pain, Decreased activity tolerance, Decreased range of motion, Decreased strength  Visit Diagnosis: Stiffness of left shoulder, not elsewhere classified  Muscle weakness (generalized)  Acute pain of left shoulder     Problem List Patient Active Problem List   Diagnosis Date Noted  . Chronic left shoulder pain 04/20/2016  . Spinal stenosis 09/02/2015  . Numbness 09/02/2015  . Bradycardia 07/01/2014  . Thrombocytopenia ([place]) 06/30/2014  . CAP (community acquired pneumonia) 06/29/2014  . Hypoxia 06/29/2014  . HTN (hypertension) 06/29/2014  . Prostate cancer ([place]) 06/29/2014  . Hyponatremia 06/29/2014    RAMSEUR,CHRIS, PTA 05/26/2016, 12:05 PM  [facility] [place], [place], [address] Phone: [phone]   Fax:  [phone]  Name: Thomas Vega MRN: [id number] Date of Birth: Aug 27, 1926  PHYSICAL THERAPY DISCHARGE SUMMARY  Visits from Start of Care: 24.  Current functional level related to goals / functional outcomes: See above.   Remaining deficits: Mild loss of left shoulder ER.   Education / Equipment: HEP. Plan: Patient agrees to discharge.  Patient goals were partially met. Patient is being discharged due to being pleased with the current functional level.  ?????        Mali Applegate MPT

## 2016-05-31 ENCOUNTER — Ambulatory Visit: Payer: Medicare Other | Attending: Specialist | Admitting: Physical Therapy

## 2016-05-31 DIAGNOSIS — R2681 Unsteadiness on feet: Secondary | ICD-10-CM | POA: Diagnosis not present

## 2016-05-31 DIAGNOSIS — M545 Low back pain: Secondary | ICD-10-CM | POA: Diagnosis not present

## 2016-05-31 DIAGNOSIS — M25612 Stiffness of left shoulder, not elsewhere classified: Secondary | ICD-10-CM | POA: Diagnosis not present

## 2016-05-31 DIAGNOSIS — G8929 Other chronic pain: Secondary | ICD-10-CM | POA: Insufficient documentation

## 2016-05-31 DIAGNOSIS — M6281 Muscle weakness (generalized): Secondary | ICD-10-CM | POA: Diagnosis not present

## 2016-05-31 NOTE — Therapy (Signed)
[facility] [place], [place], [address] Phone: [phone]   Fax:  [phone]  Physical Therapy Evaluation  Patient Details  Name: Thomas Vega MRN: [id number] Date of Birth: 01-Aug-1926 Referring Provider: Basil Dess MD  Encounter Date: 05/31/2016      PT End of Session - 05/31/16 1310    Visit Number 1   Number of Visits 16   Date for PT Re-Evaluation 07/30/16   PT Start Time 1034   PT Stop Time 1130   PT Time Calculation (min) 56 min   Activity Tolerance Patient tolerated treatment well   Behavior During Therapy [facility] for tasks assessed/performed      Past Medical History:  Diagnosis Date  . Abnormal finding of blood chemistry   . Bradycardia 07/01/2014  . CAP (community acquired pneumonia) 06/29/2014  . Heart murmur   . Irregular heart rate   . Melanoma ([place])   . Prostate cancer ([place])   . Thrombocytopenia ([place]) 06/30/2014    Past Surgical History:  Procedure Laterality Date  . BACK SURGERY    . CATARACT EXTRACTION    . KNEE SURGERY    . MELANOMA EXCISION      There were no vitals filed for this visit.       Subjective Assessment - 05/31/16 1327    Pertinent History Previous lumbar discectomy.            [facility] PT Assessment - 05/31/16 0001      Assessment   Medical Diagnosis Lumbar stenosis; Balance disorder.   Referring Provider Basil Dess MD     Precautions   Precautions Fall     Restrictions   Weight Bearing Restrictions No     Balance Screen   Has the patient fallen in the past 6 months Yes   Has the patient had a decrease in activity level because of a fear of falling?  No   Is the patient reluctant to leave their home because of a fear of falling?  No     Home Environment   Living Environment Private residence     Prior Function   Level of Independence Independent     Posture/Postural Control   Posture/Postural Control Postural limitations   Postural Limitations Rounded  Shoulders;Forward head;Increased thoracic kyphosis   Posture Comments The patient stands in 20 degrees of lumbar flexion.     ROM / Strength   AROM / PROM / Strength AROM;Strength     AROM   Overall AROM Comments Lumbar extension limited activiely -5 from the upright neutral position and active intervertebral lumbat movement into flexion is limited by 50%.     Strength   Overall Strength Comments Bilateral LE strength= 4+/5.     Palpation   Palpation comment C/O pain "in" lower lumbar region.     Special Tests    Special Tests --  Positive Romberg test.     Ambulation/Gait   Gait Comments The patient walks in spinal flexion with a cane.                   [facility] Adult PT Treatment/Exercise - 05/31/16 0001      Modalities   Modalities Electrical Stimulation;Moist Heat;Traction     Moist Heat Therapy   Number Minutes Moist Heat 10 Minutes   Moist Heat Location Lumbar Spine     Electrical Stimulation   Electrical Stimulation Location low back.   Electrical Stimulation Action IFC   Electrical Stimulation Parameters 80-150 Hz  x 10 minutes.   Electrical Stimulation Goals Pain     Traction   Type of Traction Lumbar   Min (lbs) 5   Max (lbs) 70   Hold Time 99   Rest Time 5   Time 15                  PT Short Term Goals - 05/31/16 1330      PT SHORT TERM GOAL #1   Title Ind with initial HEP.   Time 2   Period Weeks   Status New           PT Long Term Goals - 05/31/16 1330      PT LONG TERM GOAL #1   Title Ind with advanced HEP.   Time 8   Period Weeks   Status New     PT LONG TERM GOAL #2   Title Negative Romberg test.   Time 8   Period Weeks   Status New     PT LONG TERM GOAL #3   Title Perform ADL's with pain not > 4/10.               Plan - 05/31/16 1317    Clinical Impression Statement The patient presents to ongoing and worsening low back pain and balance problems.  He demonstrates a positive Romberg test.  His  lumbar intervertebral movement into active flexion is decreased by 50%.  His limitations impair his functional mobility and ability to effectively perform ADL's,  The patient is expected to meet goals with skilled physical therapy intervention.   Rehab Potential Good   PT Frequency 2x / week   PT Duration 8 weeks   PT Treatment/Interventions ADLs/Self Care Home Management;Cryotherapy;Electrical Stimulation;Moist Heat;Traction;Ultrasound;Gait training;Therapeutic activities;Therapeutic exercise;Balance training;Neuromuscular re-education;Patient/family education   PT Home Exercise Plan Int traction at 80#; KX modifier.  Modalites to low back and balance training.      Patient will benefit from skilled therapeutic intervention in order to improve the following deficits and impairments:  Decreased activity tolerance, Decreased balance, Decreased strength, Decreased range of motion, Pain  Visit Diagnosis: Unsteadiness on feet - Plan: PT plan of care cert/re-cert  Chronic bilateral low back pain without sciatica - Plan: PT plan of care cert/re-cert      G-Codes - [id number]    Functional Assessment Tool Used (Outpatient Only) FOTO....62% limitation.   Functional Limitation Mobility: Walking and moving around   Mobility: Walking and Moving Around Current Status [phone]) At least 60 percent but less than 80 percent impaired, limited or restricted   Mobility: Walking and Moving Around Goal Status [phone]) At least 20 percent but less than 40 percent impaired, limited or restricted       Problem List Patient Active Problem List   Diagnosis Date Noted  . Chronic left shoulder pain 04/20/2016  . Spinal stenosis 09/02/2015  . Numbness 09/02/2015  . Bradycardia 07/01/2014  . Thrombocytopenia ([place]) 06/30/2014  . CAP (community acquired pneumonia) 06/29/2014  . Hypoxia 06/29/2014  . HTN (hypertension) 06/29/2014  . Prostate cancer ([place]) 06/29/2014  . Hyponatremia 06/29/2014    APPLEGATE,  Mali MPT 05/31/2016, 1:33 PM  [facility] [address] [place], [place], [address] Phone: [phone]   Fax:  [phone]  Name: Thomas Vega MRN: [id number] Date of Birth: [birth year]

## 2016-06-02 ENCOUNTER — Ambulatory Visit: Payer: Medicare Other | Admitting: *Deleted

## 2016-06-02 DIAGNOSIS — M545 Low back pain, unspecified: Secondary | ICD-10-CM

## 2016-06-02 DIAGNOSIS — M25612 Stiffness of left shoulder, not elsewhere classified: Secondary | ICD-10-CM | POA: Diagnosis not present

## 2016-06-02 DIAGNOSIS — R2681 Unsteadiness on feet: Secondary | ICD-10-CM

## 2016-06-02 DIAGNOSIS — M6281 Muscle weakness (generalized): Secondary | ICD-10-CM | POA: Diagnosis not present

## 2016-06-02 DIAGNOSIS — G8929 Other chronic pain: Secondary | ICD-10-CM

## 2016-06-02 NOTE — Therapy (Signed)
[facility] [place], [place], [address] Phone: [phone]   Fax:  [phone]  Physical Therapy Treatment  Patient Details  Name: Thomas Vega MRN: [id number] Date of Birth: 1927/03/28 Referring Provider: Basil Dess MD  Encounter Date: 06/02/2016      PT End of Session - 06/02/16 1118    Visit Number 2   Number of Visits 16   Date for PT Re-Evaluation 07/30/16   Authorization Type Gcode 10th visit           ,  needs KX modifier   PT Start Time 1032   PT Stop Time 1122   PT Time Calculation (min) 50 min      Past Medical History:  Diagnosis Date  . Abnormal finding of blood chemistry   . Bradycardia 07/01/2014  . CAP (community acquired pneumonia) 06/29/2014  . Heart murmur   . Irregular heart rate   . Melanoma ([place])   . Prostate cancer ([place])   . Thrombocytopenia ([place]) 06/30/2014    Past Surgical History:  Procedure Laterality Date  . BACK SURGERY    . CATARACT EXTRACTION    . KNEE SURGERY    . MELANOMA EXCISION      There were no vitals filed for this visit.      Subjective Assessment - 06/02/16 1051    Subjective The patient has progressively worsenening low back pain and now uses a back brace as his pain easily rise to a 7-8/10 on a dailiy basis.  Morever, he continues to have balance problems.  He is now using a cane at all times to avoid falls.  pain increases with standing and walking.  Rest decreases his pain. Did well with traction   Pertinent History Previous lumbar discectomy.   Limitations Standing;Walking   How long can you stand comfortably? 10-15 minutes.   How long can you walk comfortably? Short community distance.   Currently in Pain? Yes   Pain Score 2    Pain Location Back   Pain Orientation Right;Left;Mid   Pain Descriptors / Indicators Aching                         [facility] Adult PT Treatment/Exercise - 06/02/16 0001      Exercises   Exercises Knee/Hip;Lumbar      Knee/Hip Exercises: Aerobic   Nustep L5 x 13 mins 1000 steps     Knee/Hip Exercises: Standing   Rocker Board 5 minutes  calf stretching and balance with SBA/CGA   Other Standing Knee Exercises 8 in step toe taps with CGA 6x 10     Traction   Type of Traction Lumbar   Min (lbs) 5   Max (lbs) 80   Hold Time 99   Rest Time 5   Time 15                  PT Short Term Goals - 05/31/16 1330      PT SHORT TERM GOAL #1   Title Ind with initial HEP.   Time 2   Period Weeks   Status New           PT Long Term Goals - 05/31/16 1330      PT LONG TERM GOAL #1   Title Ind with advanced HEP.   Time 8   Period Weeks   Status New     PT LONG TERM GOAL #2   Title Negative Romberg test.  Time 8   Period Weeks   Status New     PT LONG TERM GOAL #3   Title Perform ADL's with pain not > 4/10.               Plan - 06/02/16 1127    Clinical Impression Statement Pt arrived to clinic today doing better after last rx with less LBP. He was able to perform Therex and balance exs fairly well, but needs CGA during balance Acts. He did well with 80#s of pelvic traction and was able to stand more erect arterwards.   Rehab Potential Good   PT Frequency 2x / week   PT Duration 8 weeks   PT Treatment/Interventions ADLs/Self Care Home Management;Cryotherapy;Electrical Stimulation;Moist Heat;Traction;Ultrasound;Gait training;Therapeutic activities;Therapeutic exercise;Balance training;Neuromuscular re-education;Patient/family education   PT Next Visit Plan KX modifier.  Balance exercises.  Modalites to low back; int traction at 80#.   PT Home Exercise Plan Int traction at 80#; KX modifier.  Modalites to low back and balance training.   Consulted and Agree with Plan of Care Patient      Patient will benefit from skilled therapeutic intervention in order to improve the following deficits and impairments:  Decreased activity tolerance, Decreased balance, Decreased strength,  Decreased range of motion, Pain  Visit Diagnosis: Unsteadiness on feet  Chronic bilateral low back pain without sciatica     Problem List Patient Active Problem List   Diagnosis Date Noted  . Chronic left shoulder pain 04/20/2016  . Spinal stenosis 09/02/2015  . Numbness 09/02/2015  . Bradycardia 07/01/2014  . Thrombocytopenia ([place]) 06/30/2014  . CAP (community acquired pneumonia) 06/29/2014  . Hypoxia 06/29/2014  . HTN (hypertension) 06/29/2014  . Prostate cancer ([place]) 06/29/2014  . Hyponatremia 06/29/2014    Danette Weinfeld,CHRIS, PTA 06/02/2016, 12:06 PM  [facility] [place], [place], [address] Phone: [phone]   Fax:  [phone]  Name: Mostafa Yuan MRN: [id number] Date of Birth: 29-Aug-1926

## 2016-06-07 ENCOUNTER — Encounter: Payer: Medicare Other | Admitting: *Deleted

## 2016-06-07 DIAGNOSIS — H353122 Nonexudative age-related macular degeneration, left eye, intermediate dry stage: Secondary | ICD-10-CM | POA: Diagnosis not present

## 2016-06-07 DIAGNOSIS — H353114 Nonexudative age-related macular degeneration, right eye, advanced atrophic with subfoveal involvement: Secondary | ICD-10-CM | POA: Diagnosis not present

## 2016-06-07 DIAGNOSIS — H35059 Retinal neovascularization, unspecified, unspecified eye: Secondary | ICD-10-CM | POA: Diagnosis not present

## 2016-06-09 ENCOUNTER — Encounter: Payer: Medicare Other | Admitting: *Deleted

## 2016-06-13 ENCOUNTER — Encounter (INDEPENDENT_AMBULATORY_CARE_PROVIDER_SITE_OTHER): Payer: Medicare Other | Admitting: Ophthalmology

## 2016-06-13 DIAGNOSIS — H35033 Hypertensive retinopathy, bilateral: Secondary | ICD-10-CM | POA: Diagnosis not present

## 2016-06-13 DIAGNOSIS — H353211 Exudative age-related macular degeneration, right eye, with active choroidal neovascularization: Secondary | ICD-10-CM

## 2016-06-13 DIAGNOSIS — H43813 Vitreous degeneration, bilateral: Secondary | ICD-10-CM

## 2016-06-13 DIAGNOSIS — H353122 Nonexudative age-related macular degeneration, left eye, intermediate dry stage: Secondary | ICD-10-CM

## 2016-06-13 DIAGNOSIS — I10 Essential (primary) hypertension: Secondary | ICD-10-CM | POA: Diagnosis not present

## 2016-06-14 ENCOUNTER — Encounter: Payer: Medicare Other | Admitting: *Deleted

## 2016-06-16 ENCOUNTER — Encounter: Payer: Medicare Other | Admitting: *Deleted

## 2016-06-21 ENCOUNTER — Ambulatory Visit: Payer: Medicare Other | Admitting: Physical Therapy

## 2016-06-21 DIAGNOSIS — R2681 Unsteadiness on feet: Secondary | ICD-10-CM | POA: Diagnosis not present

## 2016-06-21 DIAGNOSIS — M545 Low back pain: Secondary | ICD-10-CM | POA: Diagnosis not present

## 2016-06-21 DIAGNOSIS — M6281 Muscle weakness (generalized): Secondary | ICD-10-CM | POA: Diagnosis not present

## 2016-06-21 DIAGNOSIS — M25612 Stiffness of left shoulder, not elsewhere classified: Secondary | ICD-10-CM | POA: Diagnosis not present

## 2016-06-21 DIAGNOSIS — G8929 Other chronic pain: Secondary | ICD-10-CM | POA: Diagnosis not present

## 2016-06-21 NOTE — Therapy (Signed)
[facility] [place], [place], [address] Phone: [phone]   Fax:  [phone]  Physical Therapy Treatment  Patient Details  Name: Thomas Vega MRN: [id number] Date of Birth: 07-30-1926 Referring Provider: Basil Dess MD  Encounter Date: 06/21/2016      PT End of Session - 06/21/16 1248    Activity Tolerance Patient tolerated treatment well   Behavior During Therapy [facility] for tasks assessed/performed      Past Medical History:  Diagnosis Date  . Abnormal finding of blood chemistry   . Bradycardia 07/01/2014  . CAP (community acquired pneumonia) 06/29/2014  . Heart murmur   . Irregular heart rate   . Melanoma ([place])   . Prostate cancer ([place])   . Thrombocytopenia ([place]) 06/30/2014    Past Surgical History:  Procedure Laterality Date  . BACK SURGERY    . CATARACT EXTRACTION    . KNEE SURGERY    . MELANOMA EXCISION      There were no vitals filed for this visit.      Subjective Assessment - 06/21/16 1245    Subjective I've had some eye problems so I couldn't make it.   Pain Score 2    Pain Location Back   Pain Orientation Right;Left;Mid   Pain Descriptors / Indicators Aching   Pain Type Chronic pain   Pain Onset More than a month ago                         [facility] Adult PT Treatment/Exercise - 06/21/16 0001      Exercises   Exercises Knee/Hip     Lumbar Exercises: Aerobic   Stationary Bike Nustep level 5 x 13 minutes.     Modalities   Modalities Traction     Traction   Type of Traction Lumbar   Min (lbs) 5   Max (lbs) 80   Hold Time 99   Rest Time 5   Time 15                  PT Short Term Goals - 05/31/16 1330      PT SHORT TERM GOAL #1   Title Ind with initial HEP.   Time 2   Period Weeks   Status New           PT Long Term Goals - 05/31/16 1330      PT LONG TERM GOAL #1   Title Ind with advanced HEP.   Time 8   Period Weeks   Status New     PT  LONG TERM GOAL #2   Title Negative Romberg test.   Time 8   Period Weeks   Status New     PT LONG TERM GOAL #3   Title Perform ADL's with pain not > 4/10.               Plan - 06/21/16 1248    Clinical Impression Statement The patient did well today even though he has been out of therapy for 2 weeks.   PT Next Visit Plan Int lumbar traction to 90#.  Balance activites.   Consulted and Agree with Plan of Care Patient      Patient will benefit from skilled therapeutic intervention in order to improve the following deficits and impairments:     Visit Diagnosis: Unsteadiness on feet  Chronic bilateral low back pain without sciatica     Problem List Patient Active Problem List  Diagnosis Date Noted  . Chronic left shoulder pain 04/20/2016  . Spinal stenosis 09/02/2015  . Numbness 09/02/2015  . Bradycardia 07/01/2014  . Thrombocytopenia ([place]) 06/30/2014  . CAP (community acquired pneumonia) 06/29/2014  . Hypoxia 06/29/2014  . HTN (hypertension) 06/29/2014  . Prostate cancer ([place]) 06/29/2014  . Hyponatremia 06/29/2014    Bearl Talarico, [place] MPT 06/21/2016, 12:50 PM  [facility] [address] [place], [place], [address] Phone: [phone]   Fax:  [phone]  Name: Jaeceon Michelin MRN: [id number] Date of Birth: [birth year]

## 2016-06-23 ENCOUNTER — Ambulatory Visit: Payer: Medicare Other | Admitting: *Deleted

## 2016-06-23 DIAGNOSIS — G8929 Other chronic pain: Secondary | ICD-10-CM

## 2016-06-23 DIAGNOSIS — R2681 Unsteadiness on feet: Secondary | ICD-10-CM

## 2016-06-23 DIAGNOSIS — M25612 Stiffness of left shoulder, not elsewhere classified: Secondary | ICD-10-CM

## 2016-06-23 DIAGNOSIS — M6281 Muscle weakness (generalized): Secondary | ICD-10-CM

## 2016-06-23 DIAGNOSIS — M545 Low back pain: Secondary | ICD-10-CM | POA: Diagnosis not present

## 2016-06-23 NOTE — Therapy (Signed)
[facility] [place], [place], [address] Phone: [phone]   Fax:  [phone]  Physical Therapy Treatment  Patient Details  Name: Thomas Vega MRN: [id number] Date of Birth: 26-Nov-1926 Referring Provider: Basil Dess MD  Encounter Date: 06/23/2016      PT End of Session - 06/23/16 1026    Visit Number 4   Number of Visits 16   Date for PT Re-Evaluation 07/30/16   Authorization Type Gcode 10th visit           ,  needs KX modifier   Activity Tolerance Patient tolerated treatment well   Behavior During Therapy [facility] for tasks assessed/performed      Past Medical History:  Diagnosis Date  . Abnormal finding of blood chemistry   . Bradycardia 07/01/2014  . CAP (community acquired pneumonia) 06/29/2014  . Heart murmur   . Irregular heart rate   . Melanoma ([place])   . Prostate cancer ([place])   . Thrombocytopenia ([place]) 06/30/2014    Past Surgical History:  Procedure Laterality Date  . BACK SURGERY    . CATARACT EXTRACTION    . KNEE SURGERY    . MELANOMA EXCISION      There were no vitals filed for this visit.      Subjective Assessment - 06/23/16 1024    Subjective Traction and Rxs are helping LBP 2/10   Pertinent History Previous lumbar discectomy.   Limitations Standing;Walking   How long can you stand comfortably? 10-15 minutes.   How long can you walk comfortably? Short community distance.   Pain Location Back   Pain Orientation Right;Left;Mid   Pain Descriptors / Indicators Aching   Pain Type Chronic pain   Pain Onset More than a month ago   Pain Frequency Constant                         [facility] Adult PT Treatment/Exercise - 06/23/16 0001      Exercises   Exercises Knee/Hip     Lumbar Exercises: Aerobic   Stationary Bike Nustep level 5 x 13 minutes.     Knee/Hip Exercises: Aerobic   Nustep L5 x 13 mins 1000 steps     Knee/Hip Exercises: Standing   Rocker Board 5 minutes  calf  stretching and balance with SBA/CGA   Other Standing Knee Exercises 8 in step toe taps with CGA 6x 10     Modalities   Modalities Traction     Moist Heat Therapy   Number Minutes Moist Heat 10 Minutes   Moist Heat Location Lumbar Spine     Electrical Stimulation   Electrical Stimulation Location low back.   Electrical Stimulation Action IFC x 10 mins   Electrical Stimulation Goals Pain     Traction   Type of Traction Lumbar   Min (lbs) 5   Max (lbs) 80   Hold Time 99   Rest Time 5   Time 15                  PT Short Term Goals - 05/31/16 1330      PT SHORT TERM GOAL #1   Title Ind with initial HEP.   Time 2   Period Weeks   Status New           PT Long Term Goals - 05/31/16 1330      PT LONG TERM GOAL #1   Title Ind with advanced HEP.  Time 8   Period Weeks   Status New     PT LONG TERM GOAL #2   Title Negative Romberg test.   Time 8   Period Weeks   Status New     PT LONG TERM GOAL #3   Title Perform ADL's with pain not > 4/10.               Plan - 06/23/16 1043    Clinical Impression Statement Pt arrived to clinic today feeling better with less LBP and standing with improved posture. He was unable to meet LTG for Romberg test today due to leaning backwards too far. Other goals are ongoing due to deficits Pelvic traction was left at 80#s per Pt . request   Rehab Potential Good   PT Frequency 2x / week   PT Duration 8 weeks   PT Treatment/Interventions ADLs/Self Care Home Management;Cryotherapy;Electrical Stimulation;Moist Heat;Traction;Ultrasound;Gait training;Therapeutic activities;Therapeutic exercise;Balance training;Neuromuscular re-education;Patient/family education   PT Next Visit Plan Int lumbar traction to 90#.  Balance activites.   PT Home Exercise Plan Int traction at 80#; KX modifier.  Modalites to low back and balance training.      Patient will benefit from skilled therapeutic intervention in order to improve the  following deficits and impairments:  Decreased activity tolerance, Decreased balance, Decreased strength, Decreased range of motion, Pain  Visit Diagnosis: Unsteadiness on feet  Chronic bilateral low back pain without sciatica  Stiffness of left shoulder, not elsewhere classified  Muscle weakness (generalized)     Problem List Patient Active Problem List   Diagnosis Date Noted  . Chronic left shoulder pain 04/20/2016  . Spinal stenosis 09/02/2015  . Numbness 09/02/2015  . Bradycardia 07/01/2014  . Thrombocytopenia ([place]) 06/30/2014  . CAP (community acquired pneumonia) 06/29/2014  . Hypoxia 06/29/2014  . HTN (hypertension) 06/29/2014  . Prostate cancer ([place]) 06/29/2014  . Hyponatremia 06/29/2014    Shamina Etheridge,CHRIS, PTA 06/23/2016, 12:14 PM  [facility] [place], [place], [address] Phone: [phone]   Fax:  [phone]  Name: Thomas Vega MRN: [id number] Date of Birth: 20-Dec-1926

## 2016-06-28 ENCOUNTER — Ambulatory Visit: Payer: Medicare Other | Attending: Specialist | Admitting: Physical Therapy

## 2016-06-28 DIAGNOSIS — R2681 Unsteadiness on feet: Secondary | ICD-10-CM | POA: Diagnosis not present

## 2016-06-28 DIAGNOSIS — M545 Low back pain: Secondary | ICD-10-CM | POA: Insufficient documentation

## 2016-06-28 DIAGNOSIS — G8929 Other chronic pain: Secondary | ICD-10-CM | POA: Diagnosis not present

## 2016-06-28 NOTE — Therapy (Signed)
[facility] [place], [place], [address] Phone: [phone]   Fax:  [phone]  Physical Therapy Treatment  Patient Details  Name: Thomas Vega MRN: [id number] Date of Birth: 10/29/1926 Referring Provider: Basil Dess MD  Encounter Date: 06/28/2016      PT End of Session - 06/28/16 1225    Visit Number 5   Number of Visits 16   Date for PT Re-Evaluation 07/30/16   Authorization Type Gcode 10th visit           ,  needs KX modifier   PT Start Time 1115   PT Stop Time 1230   PT Time Calculation (min) 75 min   Activity Tolerance Patient tolerated treatment well   Behavior During Therapy [facility] for tasks assessed/performed      Past Medical History:  Diagnosis Date  . Abnormal finding of blood chemistry   . Bradycardia 07/01/2014  . CAP (community acquired pneumonia) 06/29/2014  . Heart murmur   . Irregular heart rate   . Melanoma ([place])   . Prostate cancer ([place])   . Thrombocytopenia ([place]) 06/30/2014    Past Surgical History:  Procedure Laterality Date  . BACK SURGERY    . CATARACT EXTRACTION    . KNEE SURGERY    . MELANOMA EXCISION      There were no vitals filed for this visit.      Subjective Assessment - 06/28/16 1120    Subjective doing well, having a little bit of back pain, did a lot of and going up/down stairs as he moved this weekend   Pertinent History Previous lumbar discectomy.   Currently in Pain? Yes   Pain Score 3    Pain Location Back   Pain Orientation Right;Left;Mid   Pain Descriptors / Indicators Aching   Pain Type Chronic pain   Pain Onset More than a month ago   Pain Frequency Constant                         [facility] Adult PT Treatment/Exercise - 06/28/16 1121      Lumbar Exercises: Aerobic   Stationary Bike L3x10 min     Knee/Hip Exercises: Standing   Heel Raises Both;20 reps   Heel Raises Limitations bil UE support   Hip Flexion Both;10 reps;Knee bent;2 sets   Hip Flexion Limitations 2#; alternating with 1 UE support   Hip Abduction Both;2 sets;10 reps;Knee straight   Abduction Limitations 2#; alternating with 1 UE support   Hip Extension Both;2 sets;10 reps;Knee straight   Extension Limitations 2#; alternating with 1 UE support     Modalities   Modalities Electrical Stimulation;Moist Heat;Traction     Moist Heat Therapy   Number Minutes Moist Heat 10 Minutes   Moist Heat Location Lumbar Spine     Electrical Stimulation   Electrical Stimulation Location low back.   Electrical Stimulation Action IFC x 10 min   Electrical Stimulation Parameters to tolerance   Electrical Stimulation Goals Pain     Traction   Type of Traction Lumbar   Min (lbs) 5   Max (lbs) 70   Hold Time 99   Rest Time 5   Time 15                  PT Short Term Goals - 05/31/16 1330      PT SHORT TERM GOAL #1   Title Ind with initial HEP.   Time 2  Period Weeks   Status New           PT Long Term Goals - 05/31/16 1330      PT LONG TERM GOAL #1   Title Ind with advanced HEP.   Time 8   Period Weeks   Status New     PT LONG TERM GOAL #2   Title Negative Romberg test.   Time 8   Period Weeks   Status New     PT LONG TERM GOAL #3   Title Perform ADL's with pain not > 4/10.               Plan - 06/28/16 1225    Clinical Impression Statement Pt tolerated increased balance exercises today without difficulty.  Continues to request modalities and traction to help with back pain.  Will continue to benefit from PT to maximize function.   PT Treatment/Interventions ADLs/Self Care Home Management;Cryotherapy;Electrical Stimulation;Moist Heat;Traction;Ultrasound;Gait training;Therapeutic activities;Therapeutic exercise;Balance training;Neuromuscular re-education;Patient/family education   PT Next Visit Plan Keep traction at 70# per pt request.  Balance activites.   Consulted and Agree with Plan of Care Patient      Patient will benefit  from skilled therapeutic intervention in order to improve the following deficits and impairments:  Decreased activity tolerance, Decreased balance, Decreased strength, Decreased range of motion, Pain  Visit Diagnosis: Unsteadiness on feet  Chronic bilateral low back pain without sciatica     Problem List Patient Active Problem List   Diagnosis Date Noted  . Chronic left shoulder pain 04/20/2016  . Spinal stenosis 09/02/2015  . Numbness 09/02/2015  . Bradycardia 07/01/2014  . Thrombocytopenia ([place]) 06/30/2014  . CAP (community acquired pneumonia) 06/29/2014  . Hypoxia 06/29/2014  . HTN (hypertension) 06/29/2014  . Prostate cancer ([place]) 06/29/2014  . Hyponatremia 06/29/2014      Laureen Abrahams, PT, DPT 06/28/16 12:28 PM    [facility] [place], [place], [address] Phone: [phone]   Fax:  [phone]  Name: Thomas Vega MRN: [id number] Date of Birth: July 19, 1926

## 2016-06-30 ENCOUNTER — Encounter: Payer: Self-pay | Admitting: Physical Therapy

## 2016-06-30 ENCOUNTER — Ambulatory Visit: Payer: Medicare Other | Admitting: Physical Therapy

## 2016-06-30 DIAGNOSIS — G8929 Other chronic pain: Secondary | ICD-10-CM

## 2016-06-30 DIAGNOSIS — M545 Low back pain, unspecified: Secondary | ICD-10-CM

## 2016-06-30 DIAGNOSIS — R2681 Unsteadiness on feet: Secondary | ICD-10-CM | POA: Diagnosis not present

## 2016-06-30 NOTE — Patient Instructions (Signed)
  Half Squat to Chair   Stand with feet shoulder width apart. Push buttocks backward and lower slowly, sitting in chair lightly and returning to standing position. Complete _2_ sets of 10_ repetitions. Perform __2-3_ sessions per day.   Supine Pelvic Tilt (Active)   While lying on back with knees bent, pull abdomen in and up and flatten back. Hold _10__ seconds. Relax. Complete _2__ sets of _10__ repetitions. Perform _2-4__ sessions per day.      Standing, place feet apart. Hold arms out for balance or use support. Rise up on toes. Hold _3___ seconds, then lower. Repeat immediately. Repeat __10__ times. Do __2__ sessions per day.

## 2016-06-30 NOTE — Therapy (Signed)
[facility] [place], [place], [address] Phone: [phone]   Fax:  [phone]  Physical Therapy Treatment  Patient Details  Name: Thomas Vega MRN: [id number] Date of Birth: [birth year] Referring Provider: Basil Dess MD  Encounter Date: 06/30/2016      PT End of Session - 06/30/16 1056    Visit Number 6   Number of Visits 16   Date for PT Re-Evaluation 07/30/16   Authorization Type Gcode 10th visit           ,  needs KX modifier   PT Start Time 1032   PT Stop Time 1127   PT Time Calculation (min) 55 min   Activity Tolerance Patient tolerated treatment well   Behavior During Therapy [facility] for tasks assessed/performed      Past Medical History:  Diagnosis Date  . Abnormal finding of blood chemistry   . Bradycardia 07/01/2014  . CAP (community acquired pneumonia) 06/29/2014  . Heart murmur   . Irregular heart rate   . Melanoma ([place])   . Prostate cancer ([place])   . Thrombocytopenia ([place]) 06/30/2014    Past Surgical History:  Procedure Laterality Date  . BACK SURGERY    . CATARACT EXTRACTION    . KNEE SURGERY    . MELANOMA EXCISION      There were no vitals filed for this visit.      Subjective Assessment - 06/30/16 1042    Subjective Patient reported feeling good after last treatment and no pain complaints today   Pertinent History Previous lumbar discectomy.   Limitations Standing;Walking   How long can you stand comfortably? 10-15 minutes.   How long can you walk comfortably? Short community distance.   Patient Stated Goals Use my left UE without pain.   Currently in Pain? No/denies                         [facility] Adult PT Treatment/Exercise - 06/30/16 0001      Lumbar Exercises: Aerobic   Stationary Bike L3x11 min     Lumbar Exercises: Seated   Sit to Stand 5 reps     Lumbar Exercises: Supine   Ab Set 20 reps;3 seconds     Knee/Hip Exercises: Standing   Heel Raises Both;20 reps    Heel Raises Limitations intermittint UE support   Hip Flexion Both;10 reps;Knee bent;2 sets   Hip Flexion Limitations 2#; alternating with 1 UE support   Hip Abduction Both;2 sets;10 reps;Knee straight   Abduction Limitations 2#; alternating with 1 UE support   Hip Extension Both;2 sets;10 reps;Knee straight   Extension Limitations 2#; alternating with 1 UE support     Moist Heat Therapy   Number Minutes Moist Heat 10 Minutes   Moist Heat Location Lumbar Spine     Electrical Stimulation   Electrical Stimulation Location low back.   Electrical Stimulation Action IFC x81mn   Electrical Stimulation Goals Pain     Traction   Type of Traction Lumbar   Min (lbs) 5   Max (lbs) 70   Hold Time 99   Rest Time 5   Time 15                PT Education - 06/30/16 1102    Education provided Yes   Education Details HEP   Person(s) Educated Patient   Methods Explanation;Demonstration;Handout   Comprehension Verbalized understanding;Returned demonstration  PT Short Term Goals - 06/30/16 1103      PT SHORT TERM GOAL #1   Title Ind with initial HEP.   Time 2   Period Weeks   Status Achieved           PT Long Term Goals - 06/30/16 1103      PT LONG TERM GOAL #1   Title Ind with advanced HEP.   Time 8   Period Weeks   Status On-going     PT LONG TERM GOAL #2   Title Negative Romberg test.   Time 8   Period Weeks   Status On-going     PT LONG TERM GOAL #3   Title Perform ADL's with pain not > 4/10.   Time 8   Period Weeks   Status On-going               Plan - 06/30/16 1112    Clinical Impression Statement Patient tolerated treatment well today. Patient requested modalities and traction today at 70#. Patient had a LOB durning standing exercises today. HEP given for strengthening and balance. Patient was educated on keeping assist device with him at all times. Positive Romberg today. STG #1 met and LTG's ongoing today.   Rehab Potential Good    PT Frequency 2x / week   PT Duration 8 weeks   PT Treatment/Interventions ADLs/Self Care Home Management;Cryotherapy;Electrical Stimulation;Moist Heat;Traction;Ultrasound;Gait training;Therapeutic activities;Therapeutic exercise;Balance training;Neuromuscular re-education;Patient/family education   PT Next Visit Plan Keep traction at 70# per pt request.  Balance activites.   Consulted and Agree with Plan of Care Patient      Patient will benefit from skilled therapeutic intervention in order to improve the following deficits and impairments:  Decreased activity tolerance, Decreased balance, Decreased strength, Decreased range of motion, Pain  Visit Diagnosis: Unsteadiness on feet  Chronic bilateral low back pain without sciatica     Problem List Patient Active Problem List   Diagnosis Date Noted  . Chronic left shoulder pain 04/20/2016  . Spinal stenosis 09/02/2015  . Numbness 09/02/2015  . Bradycardia 07/01/2014  . Thrombocytopenia ([place]) 06/30/2014  . CAP (community acquired pneumonia) 06/29/2014  . Hypoxia 06/29/2014  . HTN (hypertension) 06/29/2014  . Prostate cancer ([place]) 06/29/2014  . Hyponatremia 06/29/2014    DUNFORD, CHRISTINA P, PTA 06/30/2016, 11:48 AM  [facility] [place], [place], [address] Phone: [phone]   Fax:  [phone]  Name: Karanveer Ramakrishnan MRN: [id number] Date of Birth: 23-Sep-1926

## 2016-07-04 ENCOUNTER — Ambulatory Visit: Payer: Medicare Other | Admitting: Physical Therapy

## 2016-07-04 DIAGNOSIS — M545 Low back pain, unspecified: Secondary | ICD-10-CM

## 2016-07-04 DIAGNOSIS — R2681 Unsteadiness on feet: Secondary | ICD-10-CM

## 2016-07-04 DIAGNOSIS — G8929 Other chronic pain: Secondary | ICD-10-CM | POA: Diagnosis not present

## 2016-07-04 NOTE — Therapy (Signed)
[facility] [place], [place], [address] Phone: [phone]   Fax:  [phone]  Physical Therapy Treatment  Patient Details  Name: Thomas Vega MRN: [id number] Date of Birth: Nov 16, 1926 Referring Provider: Basil Dess MD  Encounter Date: 07/04/2016      PT End of Session - 07/04/16 1308    Visit Number 7   Date for PT Re-Evaluation 07/30/16   Authorization Type Gcode 10th visit           ,  needs KX modifier   PT Start Time 1300  patient checked in prior to him being arrived in system   PT Stop Time 1403   PT Time Calculation (min) 63 min   Activity Tolerance Patient tolerated treatment well   Behavior During Therapy [facility] for tasks assessed/performed      Past Medical History:  Diagnosis Date  . Abnormal finding of blood chemistry   . Bradycardia 07/01/2014  . CAP (community acquired pneumonia) 06/29/2014  . Heart murmur   . Irregular heart rate   . Melanoma ([place])   . Prostate cancer ([place])   . Thrombocytopenia ([place]) 06/30/2014    Past Surgical History:  Procedure Laterality Date  . BACK SURGERY    . CATARACT EXTRACTION    . KNEE SURGERY    . MELANOMA EXCISION      There were no vitals filed for this visit.      Subjective Assessment - 07/04/16 1309    Subjective Patient states he still has pain with carrying 30# loads up stairs. Traction gives him relief for about two full days.   Pertinent History Previous lumbar discectomy.   Limitations Standing;Walking   How long can you stand comfortably? 10-15 minutes.   How long can you walk comfortably? Short community distance.   Patient Stated Goals Use my left UE without pain.   Currently in Pain? Yes   Pain Score 3    Pain Location Back   Pain Orientation Right;Left   Pain Descriptors / Indicators Aching   Pain Type Chronic pain   Pain Onset More than a month ago   Pain Frequency Constant   Aggravating Factors  carrying loads                          [facility] Adult PT Treatment/Exercise - 07/04/16 0001      Knee/Hip Exercises: Stretches   Gastroc Stretch Both;30 seconds;2 reps     Knee/Hip Exercises: Aerobic   Nustep L5x 15 min  1550 steps     Knee/Hip Exercises: Standing   Heel Raises Both;20 reps   Heel Raises Limitations VCs to avoid momentum; one finger for balance   Hip Flexion Both;10 reps;Knee bent;2 sets   Hip Flexion Limitations 2#; alternating with 1 UE support   Hip Abduction Both;2 sets;10 reps;Knee straight   Abduction Limitations 2#; alternating with 1 UE support   Hip Extension Both;2 sets;10 reps;Knee straight   Extension Limitations 2#; alternating with 1 UE support   Other Standing Knee Exercises standing with toes on foam balance beam to challenge COG     Modalities   Modalities Traction     Traction   Type of Traction Lumbar   Min (lbs) 5   Max (lbs) 70   Hold Time 99   Rest Time 5   Time 15                PT Education - 07/04/16 1551  Education provided Yes   Education Details HEP   Person(s) Educated Patient   Methods Explanation;Demonstration;Verbal cues   Comprehension Verbalized understanding;Returned demonstration          PT Short Term Goals - 06/30/16 1103      PT SHORT TERM GOAL #1   Title Ind with initial HEP.   Time 2   Period Weeks   Status Achieved           PT Long Term Goals - 06/30/16 1103      PT LONG TERM GOAL #1   Title Ind with advanced HEP.   Time 8   Period Weeks   Status On-going     PT LONG TERM GOAL #2   Title Negative Romberg test.   Time 8   Period Weeks   Status On-going     PT LONG TERM GOAL #3   Title Perform ADL's with pain not > 4/10.   Time 8   Period Weeks   Status On-going               Plan - 07/04/16 1552    Clinical Impression Statement Patient tolerated TE well today. He has tight gastrocs which may be affecting balance. Stretch was issued for HEP. Normal response to  traction.    PT Treatment/Interventions ADLs/Self Care Home Management;Cryotherapy;Electrical Stimulation;Moist Heat;Traction;Ultrasound;Gait training;Therapeutic activities;Therapeutic exercise;Balance training;Neuromuscular re-education;Patient/family education   PT Next Visit Plan Keep traction at 70# per pt request.  Balance activites.   PT Home Exercise Plan gastroc stretch      Patient will benefit from skilled therapeutic intervention in order to improve the following deficits and impairments:  Decreased activity tolerance, Decreased balance, Decreased strength, Decreased range of motion, Pain  Visit Diagnosis: Unsteadiness on feet  Chronic bilateral low back pain without sciatica     Problem List Patient Active Problem List   Diagnosis Date Noted  . Chronic left shoulder pain 04/20/2016  . Spinal stenosis 09/02/2015  . Numbness 09/02/2015  . Bradycardia 07/01/2014  . Thrombocytopenia ([place]) 06/30/2014  . CAP (community acquired pneumonia) 06/29/2014  . Hypoxia 06/29/2014  . HTN (hypertension) 06/29/2014  . Prostate cancer ([place]) 06/29/2014  . Hyponatremia 06/29/2014    Madelyn Flavors PT 07/04/2016, 3:56 PM  [facility] [place], [place], [address] Phone: [phone]   Fax:  [phone]  Name: Thomas Vega MRN: [id number] Date of Birth: December 28, 1926

## 2016-07-05 ENCOUNTER — Encounter: Payer: Medicare Other | Admitting: *Deleted

## 2016-07-06 ENCOUNTER — Ambulatory Visit: Payer: Medicare Other | Admitting: Physical Therapy

## 2016-07-06 DIAGNOSIS — R2681 Unsteadiness on feet: Secondary | ICD-10-CM | POA: Diagnosis not present

## 2016-07-06 DIAGNOSIS — M545 Low back pain: Secondary | ICD-10-CM | POA: Diagnosis not present

## 2016-07-06 DIAGNOSIS — G8929 Other chronic pain: Secondary | ICD-10-CM | POA: Diagnosis not present

## 2016-07-06 NOTE — Therapy (Signed)
[facility] [place], [place], [address] Phone: [phone]   Fax:  [phone]  Physical Therapy Treatment  Patient Details  Name: Thomas Vega MRN: [id number] Date of Birth: 1926/03/31 Referring Provider: Basil Dess MD  Encounter Date: 07/06/2016      PT End of Session - 07/06/16 1750    Visit Number 8   Number of Visits 16   Date for PT Re-Evaluation 07/30/16   PT Start Time 0149   PT Stop Time 0233   PT Time Calculation (min) 44 min   Activity Tolerance Patient tolerated treatment well   Behavior During Therapy [facility] for tasks assessed/performed      Past Medical History:  Diagnosis Date  . Abnormal finding of blood chemistry   . Bradycardia 07/01/2014  . CAP (community acquired pneumonia) 06/29/2014  . Heart murmur   . Irregular heart rate   . Melanoma ([place])   . Prostate cancer ([place])   . Thrombocytopenia ([place]) 06/30/2014    Past Surgical History:  Procedure Laterality Date  . BACK SURGERY    . CATARACT EXTRACTION    . KNEE SURGERY    . MELANOMA EXCISION      There were no vitals filed for this visit.      Subjective Assessment - 07/06/16 1745    Subjective Traction has ben helping.   Pain Score 3    Pain Location Back   Pain Orientation Right;Left   Pain Descriptors / Indicators Aching   Pain Type Chronic pain   Pain Onset More than a month ago                         [facility] Adult PT Treatment/Exercise - 07/06/16 0001      Neuro Re-ed    Neuro Re-ed Details  Neuro re-edu balance activites in parallel bars including toe taps to 6 inch steps and lateral steps 3 x 10 f/b Airex balance pad side-stepping f/b XTS resisted walking with orange band 2 minutes forward and 2 minutes backward.     Exercises   Exercises Knee/Hip     Lumbar Exercises: Aerobic   Stationary Bike Nustep x 15 minutes.     Traction   Type of Traction Lumbar   Min (lbs) 5   Max (lbs) 80   Hold Time 99   Rest Time 5   Time 15                  PT Short Term Goals - 06/30/16 1103      PT SHORT TERM GOAL #1   Title Ind with initial HEP.   Time 2   Period Weeks   Status Achieved           PT Long Term Goals - 06/30/16 1103      PT LONG TERM GOAL #1   Title Ind with advanced HEP.   Time 8   Period Weeks   Status On-going     PT LONG TERM GOAL #2   Title Negative Romberg test.   Time 8   Period Weeks   Status On-going     PT LONG TERM GOAL #3   Title Perform ADL's with pain not > 4/10.   Time 8   Period Weeks   Status On-going               Plan - 07/06/16 1751    Clinical Impression Statement Patient doing well.  He  feels traction treatments are helping.      Patient will benefit from skilled therapeutic intervention in order to improve the following deficits and impairments:     Visit Diagnosis: Unsteadiness on feet  Chronic bilateral low back pain without sciatica     Problem List Patient Active Problem List   Diagnosis Date Noted  . Chronic left shoulder pain 04/20/2016  . Spinal stenosis 09/02/2015  . Numbness 09/02/2015  . Bradycardia 07/01/2014  . Thrombocytopenia ([place]) 06/30/2014  . CAP (community acquired pneumonia) 06/29/2014  . Hypoxia 06/29/2014  . HTN (hypertension) 06/29/2014  . Prostate cancer ([place]) 06/29/2014  . Hyponatremia 06/29/2014    Suzanna Zahn, [place] MPT 07/06/2016, 5:53 PM  [facility] [address] [place], [place], [address] Phone: [phone]   Fax:  [phone]  Name: Thomas Vega MRN: [id number] Date of Birth: 03/25/1927

## 2016-07-07 ENCOUNTER — Encounter: Payer: Medicare Other | Admitting: *Deleted

## 2016-07-12 ENCOUNTER — Ambulatory Visit: Payer: Medicare Other | Admitting: *Deleted

## 2016-07-12 DIAGNOSIS — M545 Low back pain: Secondary | ICD-10-CM | POA: Diagnosis not present

## 2016-07-12 DIAGNOSIS — G8929 Other chronic pain: Secondary | ICD-10-CM

## 2016-07-12 DIAGNOSIS — R2681 Unsteadiness on feet: Secondary | ICD-10-CM | POA: Diagnosis not present

## 2016-07-12 NOTE — Therapy (Signed)
[facility] [place], [place], [address] Phone: [phone]   Fax:  [phone]  Physical Therapy Treatment  Patient Details  Name: Thomas Vega MRN: [id number] Date of Birth: [birth year] Referring Provider: Basil Dess MD  Encounter Date: 07/12/2016      PT End of Session - 07/12/16 1102    Visit Number 9   Number of Visits 16   Date for PT Re-Evaluation 07/30/16   Authorization Type Gcode 10th visit           ,  needs KX modifier   PT Start Time 1037   PT Stop Time 1137   PT Time Calculation (min) 60 min      Past Medical History:  Diagnosis Date  . Abnormal finding of blood chemistry   . Bradycardia 07/01/2014  . CAP (community acquired pneumonia) 06/29/2014  . Heart murmur   . Irregular heart rate   . Melanoma ([place])   . Prostate cancer ([place])   . Thrombocytopenia ([place]) 06/30/2014    Past Surgical History:  Procedure Laterality Date  . BACK SURGERY    . CATARACT EXTRACTION    . KNEE SURGERY    . MELANOMA EXCISION      There were no vitals filed for this visit.      Subjective Assessment - 07/12/16 1038    Subjective Traction has been helping.  Did good after last Rx   Pertinent History Previous lumbar discectomy.   Limitations Standing;Walking   How long can you stand comfortably? 10-15 minutes.   How long can you walk comfortably? Short community distance.   Patient Stated Goals Use my left UE without pain.   Currently in Pain? Yes   Pain Location Back   Pain Orientation Right;Left   Pain Descriptors / Indicators Aching   Pain Type Chronic pain   Pain Onset More than a month ago   Pain Frequency Constant                         [facility] Adult PT Treatment/Exercise - 07/12/16 0001      Neuro Re-ed    Neuro Re-ed Details  Neuro re-edu balance activites in parallel bars including toe taps to 6 inch steps and lateral steps 3 x 10 f/b Airex balance pad side-stepping  and diagonal  reaching     Exercises   Exercises Knee/Hip     Lumbar Exercises: Aerobic   Stationary Bike Nustep x 15 minutes.  1470 steps     Modalities   Modalities Traction     Moist Heat Therapy   Number Minutes Moist Heat 12 Minutes     Electrical Stimulation   Electrical Stimulation Location low back.   Electrical Stimulation Action IFC x 10 mins   Electrical Stimulation Goals Pain     Traction   Type of Traction Lumbar   Min (lbs) 5   Max (lbs) 80   Hold Time 99   Rest Time 5   Time 15                  PT Short Term Goals - 06/30/16 1103      PT SHORT TERM GOAL #1   Title Ind with initial HEP.   Time 2   Period Weeks   Status Achieved           PT Long Term Goals - 06/30/16 1103      PT LONG TERM GOAL #1  Title Ind with advanced HEP.   Time 8   Period Weeks   Status On-going     PT LONG TERM GOAL #2   Title Negative Romberg test.   Time 8   Period Weeks   Status On-going     PT LONG TERM GOAL #3   Title Perform ADL's with pain not > 4/10.   Time 8   Period Weeks   Status On-going               Plan - 07/12/16 1121    Clinical Impression Statement Pt did fairly well with today's Rx. He was able to perform balance exs a little better today with only 2 LOB. He was most challenged by SLS on RT LE and toe touching LT foot on 6in step. Traction was performed at 80's again and did well.   Rehab Potential Good   PT Frequency 2x / week   PT Duration 8 weeks   PT Treatment/Interventions ADLs/Self Care Home Management;Cryotherapy;Electrical Stimulation;Moist Heat;Traction;Ultrasound;Gait training;Therapeutic activities;Therapeutic exercise;Balance training;Neuromuscular re-education;Patient/family education   PT Next Visit Plan  traction at 80# per.  Balance activites.   PT Home Exercise Plan gastroc stretch   Consulted and Agree with Plan of Care Patient      Patient will benefit from skilled therapeutic intervention in order to improve the  following deficits and impairments:  Decreased activity tolerance, Decreased balance, Decreased strength, Decreased range of motion, Pain  Visit Diagnosis: Unsteadiness on feet  Chronic bilateral low back pain without sciatica     Problem List Patient Active Problem List   Diagnosis Date Noted  . Chronic left shoulder pain 04/20/2016  . Spinal stenosis 09/02/2015  . Numbness 09/02/2015  . Bradycardia 07/01/2014  . Thrombocytopenia ([place]) 06/30/2014  . CAP (community acquired pneumonia) 06/29/2014  . Hypoxia 06/29/2014  . HTN (hypertension) 06/29/2014  . Prostate cancer ([place]) 06/29/2014  . Hyponatremia 06/29/2014    Marchele Decock,CHRIS, PTA 07/12/2016, 12:09 PM  [facility] [place], [place], [address] Phone: [phone]   Fax:  [phone]  Name: Thomas Vega MRN: [id number] Date of Birth: [birth year]

## 2016-07-13 ENCOUNTER — Encounter (INDEPENDENT_AMBULATORY_CARE_PROVIDER_SITE_OTHER): Payer: Medicare Other | Admitting: Ophthalmology

## 2016-07-13 DIAGNOSIS — H43813 Vitreous degeneration, bilateral: Secondary | ICD-10-CM

## 2016-07-13 DIAGNOSIS — H353122 Nonexudative age-related macular degeneration, left eye, intermediate dry stage: Secondary | ICD-10-CM | POA: Diagnosis not present

## 2016-07-13 DIAGNOSIS — H35033 Hypertensive retinopathy, bilateral: Secondary | ICD-10-CM

## 2016-07-13 DIAGNOSIS — H353211 Exudative age-related macular degeneration, right eye, with active choroidal neovascularization: Secondary | ICD-10-CM

## 2016-07-13 DIAGNOSIS — I10 Essential (primary) hypertension: Secondary | ICD-10-CM | POA: Diagnosis not present

## 2016-07-14 ENCOUNTER — Encounter: Payer: Medicare Other | Admitting: *Deleted

## 2016-07-19 ENCOUNTER — Ambulatory Visit: Payer: Medicare Other | Admitting: *Deleted

## 2016-07-19 DIAGNOSIS — G8929 Other chronic pain: Secondary | ICD-10-CM | POA: Diagnosis not present

## 2016-07-19 DIAGNOSIS — R2681 Unsteadiness on feet: Secondary | ICD-10-CM | POA: Diagnosis not present

## 2016-07-19 DIAGNOSIS — M545 Low back pain, unspecified: Secondary | ICD-10-CM

## 2016-07-19 NOTE — Therapy (Signed)
[facility] [place], [place], [address] Phone: [phone]   Fax:  [phone]  Physical Therapy Treatment  Patient Details  Name: Thomas Vega MRN: [id number] Date of Birth: Nov 17, 1926 Referring Provider: Basil Dess MD  Encounter Date: 07/19/2016      PT End of Session - 07/19/16 1107    Visit Number 10   Number of Visits 16   Date for PT Re-Evaluation 07/30/16   Authorization Type Gcode 10th visit ,  needs KX modifier   PT Start Time 1030   PT Stop Time 1128   PT Time Calculation (min) 58 min      Past Medical History:  Diagnosis Date  . Abnormal finding of blood chemistry   . Bradycardia 07/01/2014  . CAP (community acquired pneumonia) 06/29/2014  . Heart murmur   . Irregular heart rate   . Melanoma ([place])   . Prostate cancer ([place])   . Thrombocytopenia ([place]) 06/30/2014    Past Surgical History:  Procedure Laterality Date  . BACK SURGERY    . CATARACT EXTRACTION    . KNEE SURGERY    . MELANOMA EXCISION      There were no vitals filed for this visit.      Subjective Assessment - 07/19/16 1047    Subjective Traction has been helping.  Did good after last Rx at 80#s   Pertinent History Previous lumbar discectomy.   Limitations Standing;Walking   How long can you stand comfortably? 10-15 minutes.   How long can you walk comfortably? Short community distance.   Patient Stated Goals Use my left UE without pain.   Currently in Pain? Yes   Pain Score 3    Pain Location Back   Pain Orientation Right;Left   Pain Type Chronic pain   Pain Onset More than a month ago   Pain Frequency Intermittent                                   PT Short Term Goals - 06/30/16 1103      PT SHORT TERM GOAL #1   Title Ind with initial HEP.   Time 2   Period Weeks   Status Achieved           PT Long Term Goals - 06/30/16 1103      PT LONG TERM GOAL #1   Title Ind with advanced HEP.   Time 8   Period Weeks   Status On-going     PT LONG TERM GOAL #2   Title Negative Romberg test.   Time 8   Period Weeks   Status On-going     PT LONG TERM GOAL #3   Title Perform ADL's with pain not > 4/10.   Time 8   Period Weeks   Status On-going               Plan - 07/19/16 1113    Clinical Impression Statement Pt arrived to clinic feeling fairly well with minimal LBP and feels that traction is helping. He was able to perform therex and balance act.'s easier today with less assistance with resisted walking. 80#s of pelvic traction was perforemed again today and tolerated well   Rehab Potential Good   PT Frequency 2x / week   PT Duration 8 weeks   PT Treatment/Interventions ADLs/Self Care Home Management;Cryotherapy;Electrical Stimulation;Moist Heat;Traction;Ultrasound;Gait training;Therapeutic activities;Therapeutic exercise;Balance training;Neuromuscular re-education;Patient/family education   PT  Next Visit Plan Try traction at 90# per MPT .  Balance activites.   PT Home Exercise Plan gastroc stretch   Consulted and Agree with Plan of Care Patient      Patient will benefit from skilled therapeutic intervention in order to improve the following deficits and impairments:  Decreased activity tolerance, Decreased balance, Decreased strength, Decreased range of motion, Pain  Visit Diagnosis: Unsteadiness on feet  Chronic bilateral low back pain without sciatica       G-Codes - 08/08/2016 1154    Functional Assessment Tool Used (Outpatient Only) 10th visit FOTO 45% limited      Problem List Patient Active Problem List   Diagnosis Date Noted  . Chronic left shoulder pain 04/20/2016  . Spinal stenosis 09/02/2015  . Numbness 09/02/2015  . Bradycardia 07/01/2014  . Thrombocytopenia ([place]) 06/30/2014  . CAP (community acquired pneumonia) 06/29/2014  . Hypoxia 06/29/2014  . HTN (hypertension) 06/29/2014  . Prostate cancer ([place]) 06/29/2014  . Hyponatremia  06/29/2014    Colleena Kurtenbach,CHRIS 08-08-16, 11:54 AM  [facility] [place], [place], [address] Phone: [phone]   Fax:  [phone]  Name: Thomas Vega MRN: [id number] Date of Birth: 05-Jun-1926

## 2016-07-21 ENCOUNTER — Ambulatory Visit: Payer: Medicare Other | Admitting: *Deleted

## 2016-07-21 DIAGNOSIS — G8929 Other chronic pain: Secondary | ICD-10-CM | POA: Diagnosis not present

## 2016-07-21 DIAGNOSIS — R2681 Unsteadiness on feet: Secondary | ICD-10-CM | POA: Diagnosis not present

## 2016-07-21 DIAGNOSIS — M545 Low back pain: Secondary | ICD-10-CM | POA: Diagnosis not present

## 2016-07-21 NOTE — Therapy (Signed)
[facility] [place], [place], [address] Phone: [phone]   Fax:  [phone]  Physical Therapy Treatment  Patient Details  Name: Thomas Vega MRN: [id number] Date of Birth: September 05, 1926 Referring Provider: Basil Dess MD  Encounter Date: 07/21/2016      PT End of Session - 07/21/16 1300    Visit Number 11   Number of Visits 16   Date for PT Re-Evaluation 07/30/16   Authorization Type Gcode 10th visit ,  needs KX modifier   PT Start Time 1030   PT Stop Time 1129   PT Time Calculation (min) 59 min      Past Medical History:  Diagnosis Date  . Abnormal finding of blood chemistry   . Bradycardia 07/01/2014  . CAP (community acquired pneumonia) 06/29/2014  . Heart murmur   . Irregular heart rate   . Melanoma ([place])   . Prostate cancer ([place])   . Thrombocytopenia ([place]) 06/30/2014    Past Surgical History:  Procedure Laterality Date  . BACK SURGERY    . CATARACT EXTRACTION    . KNEE SURGERY    . MELANOMA EXCISION      There were no vitals filed for this visit.      Subjective Assessment - 07/21/16 1119    Subjective Traction has been helping.  Did good after last Rx at 80#s   Pertinent History Previous lumbar discectomy.   Limitations Standing;Walking   How long can you stand comfortably? 10-15 minutes.   How long can you walk comfortably? Short community distance.                         [facility] Adult PT Treatment/Exercise - 07/21/16 0001      Exercises   Exercises (P)  Knee/Hip     Lumbar Exercises: Aerobic   Stationary Bike (P)  Nustep x 15 minutes.  1500 steps     Modalities   Modalities (P)  Traction     Moist Heat Therapy   Number Minutes Moist Heat (P)  13 Minutes   Moist Heat Location (P)  Lumbar Spine     Electrical Stimulation   Electrical Stimulation Location (P)  low back.  13 mins IFC x 15 mins   Electrical Stimulation Goals (P)  Pain     Traction   Type of Traction  (P)  Lumbar   Min (lbs) (P)  5   Max (lbs) (P)  90   Hold Time (P)  99   Rest Time (P)  5   Time (P)  15       Resisted walking XTS pink x 10 each way              PT Short Term Goals - 06/30/16 1103      PT SHORT TERM GOAL #1   Title Ind with initial HEP.   Time 2   Period Weeks   Status Achieved           PT Long Term Goals - 06/30/16 1103      PT LONG TERM GOAL #1   Title Ind with advanced HEP.   Time 8   Period Weeks   Status On-going     PT LONG TERM GOAL #2   Title Negative Romberg test.   Time 8   Period Weeks   Status On-going     PT LONG TERM GOAL #3   Title Perform ADL's with pain not >  4/10.   Time 8   Period Weeks   Status On-going               Plan - 07/21/16 1300    Clinical Impression Statement Pt arrived today doing fairly well after last Rx. He was able to perform Therex and balance exs today with less assistance during resisted walking. Traction was performed at 90#s today and tolerated well   PT Frequency 2x / week   PT Duration 8 weeks   PT Treatment/Interventions ADLs/Self Care Home Management;Cryotherapy;Electrical Stimulation;Moist Heat;Traction;Ultrasound;Gait training;Therapeutic activities;Therapeutic exercise;Balance training;Neuromuscular re-education;Patient/family education   PT Next Visit Plan Try traction at 90# again per MPT .  Balance activites.   PT Home Exercise Plan gastroc stretch   Consulted and Agree with Plan of Care Patient      Patient will benefit from skilled therapeutic intervention in order to improve the following deficits and impairments:  Decreased activity tolerance, Decreased balance, Decreased strength, Decreased range of motion, Pain  Visit Diagnosis: Chronic bilateral low back pain without sciatica  Unsteadiness on feet     Problem List Patient Active Problem List   Diagnosis Date Noted  . Chronic left shoulder pain 04/20/2016  . Spinal stenosis 09/02/2015  . Numbness  09/02/2015  . Bradycardia 07/01/2014  . Thrombocytopenia ([place]) 06/30/2014  . CAP (community acquired pneumonia) 06/29/2014  . Hypoxia 06/29/2014  . HTN (hypertension) 06/29/2014  . Prostate cancer ([place]) 06/29/2014  . Hyponatremia 06/29/2014    RAMSEUR,CHRIS, PTA 07/21/2016, 1:58 PM  [facility] [address] [place], [place], [address] Phone: [phone]   Fax:  [phone]  Name: Thomas Vega MRN: [id number] Date of Birth: [birth year]

## 2016-07-25 ENCOUNTER — Ambulatory Visit: Payer: Medicare Other | Admitting: Physical Therapy

## 2016-07-25 ENCOUNTER — Encounter: Payer: Self-pay | Admitting: Physical Therapy

## 2016-07-25 DIAGNOSIS — M545 Low back pain: Secondary | ICD-10-CM | POA: Diagnosis not present

## 2016-07-25 DIAGNOSIS — R2681 Unsteadiness on feet: Secondary | ICD-10-CM

## 2016-07-25 DIAGNOSIS — G8929 Other chronic pain: Secondary | ICD-10-CM

## 2016-07-25 NOTE — Therapy (Signed)
[facility] [place], [place], [address] Phone: [phone]   Fax:  [phone]  Physical Therapy Treatment  Patient Details  Name: Thomas Vega MRN: [id number] Date of Birth: 06-Feb-1927 Referring Provider: Basil Dess MD  Encounter Date: 07/25/2016      PT End of Session - 07/25/16 1308    Visit Number 12   Number of Visits 16   Date for PT Re-Evaluation 07/30/16   Authorization Type Gcode 10th visit ,  needs KX modifier   PT Start Time 1306   PT Stop Time 1400   PT Time Calculation (min) 54 min   Activity Tolerance Patient tolerated treatment well   Behavior During Therapy [facility] for tasks assessed/performed      Past Medical History:  Diagnosis Date  . Abnormal finding of blood chemistry   . Bradycardia 07/01/2014  . CAP (community acquired pneumonia) 06/29/2014  . Heart murmur   . Irregular heart rate   . Melanoma ([place])   . Prostate cancer ([place])   . Thrombocytopenia ([place]) 06/30/2014    Past Surgical History:  Procedure Laterality Date  . BACK SURGERY    . CATARACT EXTRACTION    . KNEE SURGERY    . MELANOMA EXCISION      There were no vitals filed for this visit.      Subjective Assessment - 07/25/16 1307    Subjective Reports only minimal pain. Reported a little low back pain and pull in R glute region.   Pertinent History Previous lumbar discectomy.   Limitations Standing;Walking   How long can you stand comfortably? 10-15 minutes.   How long can you walk comfortably? Short community distance.   Currently in Pain? Yes   Pain Score 2    Pain Location Back   Pain Orientation Lower   Pain Descriptors / Indicators Discomfort   Pain Type Chronic pain   Pain Onset More than a month ago            [facility] PT Assessment - 07/25/16 0001      Assessment   Medical Diagnosis Lumbar stenosis; Balance disorder.     Precautions   Precautions Fall     Restrictions   Weight Bearing Restrictions No                      [facility] Adult PT Treatment/Exercise - 07/25/16 0001      Lumbar Exercises: Aerobic   Stationary Bike Nustep L5 x 15 minutes.  1500 step goal     Knee/Hip Exercises: Standing   Walking with Sports Cord Pink XTS resisted walking x10 reps each     Modalities   Modalities Electrical Stimulation;Moist Heat;Traction     Moist Heat Therapy   Number Minutes Moist Heat 10 Minutes   Moist Heat Location Lumbar Spine     Electrical Stimulation   Electrical Stimulation Location B lumbar paraspinals   Electrical Stimulation Action IFC   Electrical Stimulation Parameters 80-150 hz x10 min   Electrical Stimulation Goals Pain     Traction   Type of Traction Lumbar   Min (lbs) 5   Max (lbs) 95   Hold Time 99   Rest Time 5   Time 15                  PT Short Term Goals - 06/30/16 1103      PT SHORT TERM GOAL #1   Title Ind with initial HEP.   Time  2   Period Weeks   Status Achieved           PT Long Term Goals - 07/25/16 1357      PT LONG TERM GOAL #1   Title Ind with advanced HEP.   Time 8   Period Weeks   Status On-going     PT LONG TERM GOAL #2   Title Negative Romberg test.   Time 8   Period Weeks   Status On-going     PT LONG TERM GOAL #3   Title Perform ADL's with pain not > 4/10.   Time 8   Period Weeks   Status Partially Met  Patient reports yes but standing for washing dishes causes increased pain 07/25/2016               Plan - 07/25/16 1338    Clinical Impression Statement Patient arrived to treatment with low level low back pain per patient report. Patient experienced more fatigue today following NuStep per patient report. Patient more guarded with resisted walking today with no LOB and supervision provided by PTA. Normal modalities response noted following removal of the modalities. Traction maintained at 90# per patient report that he had soreness following previous traction session.   Rehab Potential Good    PT Frequency 2x / week   PT Duration 8 weeks   PT Treatment/Interventions ADLs/Self Care Home Management;Cryotherapy;Electrical Stimulation;Moist Heat;Traction;Ultrasound;Gait training;Therapeutic activities;Therapeutic exercise;Balance training;Neuromuscular re-education;Patient/family education   PT Next Visit Plan Continue balance and traction per MPT POC.   PT Home Exercise Plan gastroc stretch   Consulted and Agree with Plan of Care Patient      Patient will benefit from skilled therapeutic intervention in order to improve the following deficits and impairments:  Decreased activity tolerance, Decreased balance, Decreased strength, Decreased range of motion, Pain  Visit Diagnosis: Chronic bilateral low back pain without sciatica  Unsteadiness on feet     Problem List Patient Active Problem List   Diagnosis Date Noted  . Chronic left shoulder pain 04/20/2016  . Spinal stenosis 09/02/2015  . Numbness 09/02/2015  . Bradycardia 07/01/2014  . Thrombocytopenia ([place]) 06/30/2014  . CAP (community acquired pneumonia) 06/29/2014  . Hypoxia 06/29/2014  . HTN (hypertension) 06/29/2014  . Prostate cancer ([place]) 06/29/2014  . Hyponatremia 06/29/2014    Wynelle Fanny, PTA 07/25/2016, 2:18 PM  [facility] [address] [place], [place], [address] Phone: [phone]   Fax:  [phone]  Name: Jakarri Lesko MRN: [id number] Date of Birth: 06/11/1926

## 2016-07-28 ENCOUNTER — Ambulatory Visit: Payer: Medicare Other | Attending: Specialist | Admitting: Physical Therapy

## 2016-07-28 DIAGNOSIS — M25612 Stiffness of left shoulder, not elsewhere classified: Secondary | ICD-10-CM | POA: Diagnosis not present

## 2016-07-28 DIAGNOSIS — R2681 Unsteadiness on feet: Secondary | ICD-10-CM | POA: Insufficient documentation

## 2016-07-28 DIAGNOSIS — M6281 Muscle weakness (generalized): Secondary | ICD-10-CM | POA: Diagnosis not present

## 2016-07-28 DIAGNOSIS — G8929 Other chronic pain: Secondary | ICD-10-CM | POA: Insufficient documentation

## 2016-07-28 DIAGNOSIS — M545 Low back pain: Secondary | ICD-10-CM | POA: Diagnosis not present

## 2016-07-28 NOTE — Therapy (Signed)
[facility] [place], [place], [address] Phone: [phone]   Fax:  [phone]  Physical Therapy Treatment  Patient Details  Name: Illias Pantano MRN: [id number] Date of Birth: 06-01-1926 Referring Provider: Basil Dess MD  Encounter Date: 07/28/2016      PT End of Session - 07/28/16 1455    Visit Number 13   Number of Visits 16   Date for PT Re-Evaluation 07/30/16   PT Start Time 0100   PT Stop Time 0153   PT Time Calculation (min) 53 min   Activity Tolerance Patient tolerated treatment well   Behavior During Therapy [facility] for tasks assessed/performed      Past Medical History:  Diagnosis Date  . Abnormal finding of blood chemistry   . Bradycardia 07/01/2014  . CAP (community acquired pneumonia) 06/29/2014  . Heart murmur   . Irregular heart rate   . Melanoma ([place])   . Prostate cancer ([place])   . Thrombocytopenia ([place]) 06/30/2014    Past Surgical History:  Procedure Laterality Date  . BACK SURGERY    . CATARACT EXTRACTION    . KNEE SURGERY    . MELANOMA EXCISION      There were no vitals filed for this visit.      Subjective Assessment - 07/28/16 1455    Subjective I felt very good after last treatment.  I walked through 3 "big box stores".  By the third sore my back was hurting a lot.   Pain Score 4    Pain Location Back   Pain Orientation Lower   Pain Onset More than a month ago                         [facility] Adult PT Treatment/Exercise - 07/28/16 0001      Neuro Re-ed    Neuro Re-ed Details  Neuro re-education: in parallel bars:  Inverted BOSU ball x 7 minutes f/b resisted walking with orange XTS band 4 way walking x 7 minutes..     Exercises   Exercises Knee/Hip     Lumbar Exercises: Aerobic   Stationary Bike Nustep level 5 x  15 minutes.     Modalities   Modalities Traction     Traction   Type of Traction Lumbar   Min (lbs) 5   Max (lbs) 95   Hold Time 99   Rest Time 5   Time 15                  PT Short Term Goals - 06/30/16 1103      PT SHORT TERM GOAL #1   Title Ind with initial HEP.   Time 2   Period Weeks   Status Achieved           PT Long Term Goals - 07/25/16 1357      PT LONG TERM GOAL #1   Title Ind with advanced HEP.   Time 8   Period Weeks   Status On-going     PT LONG TERM GOAL #2   Title Negative Romberg test.   Time 8   Period Weeks   Status On-going     PT LONG TERM GOAL #3   Title Perform ADL's with pain not > 4/10.   Time 8   Period Weeks   Status Partially Met  Patient reports yes but standing for washing dishes causes increased pain 07/25/2016  Plan - 07/28/16 1502    Clinical Impression Statement Good response to treatment.  Patient states that traction has been very helpful.      Patient will benefit from skilled therapeutic intervention in order to improve the following deficits and impairments:  Decreased activity tolerance, Decreased balance, Decreased strength, Decreased range of motion, Pain  Visit Diagnosis: Chronic bilateral low back pain without sciatica  Unsteadiness on feet     Problem List Patient Active Problem List   Diagnosis Date Noted  . Chronic left shoulder pain 04/20/2016  . Spinal stenosis 09/02/2015  . Numbness 09/02/2015  . Bradycardia 07/01/2014  . Thrombocytopenia ([place]) 06/30/2014  . CAP (community acquired pneumonia) 06/29/2014  . Hypoxia 06/29/2014  . HTN (hypertension) 06/29/2014  . Prostate cancer ([place]) 06/29/2014  . Hyponatremia 06/29/2014    Maxtyn Nuzum, [place] MPT 07/28/2016, 3:08 PM  [facility] [address] [place], [place], [address] Phone: [phone]   Fax:  [phone]  Name: Tilden Broz MRN: [id number] Date of Birth: [birth year]

## 2016-08-04 ENCOUNTER — Ambulatory Visit: Payer: Medicare Other | Admitting: *Deleted

## 2016-08-04 DIAGNOSIS — M545 Low back pain: Principal | ICD-10-CM

## 2016-08-04 DIAGNOSIS — M6281 Muscle weakness (generalized): Secondary | ICD-10-CM | POA: Diagnosis not present

## 2016-08-04 DIAGNOSIS — R2681 Unsteadiness on feet: Secondary | ICD-10-CM

## 2016-08-04 DIAGNOSIS — G8929 Other chronic pain: Secondary | ICD-10-CM | POA: Diagnosis not present

## 2016-08-04 DIAGNOSIS — M25612 Stiffness of left shoulder, not elsewhere classified: Secondary | ICD-10-CM | POA: Diagnosis not present

## 2016-08-04 NOTE — Therapy (Signed)
[facility] [place], [place], [address] Phone: [phone]   Fax:  [phone]  Physical Therapy Treatment  Patient Details  Name: Thomas Vega MRN: [id number] Date of Birth: 1926/04/29 Referring Provider: Basil Dess MD  Encounter Date: 08/04/2016      PT End of Session - 08/04/16 1056    Visit Number 14   Number of Visits 16   Date for PT Re-Evaluation 07/30/16   Authorization Type Gcode 10th visit ,  needs KX modifier   PT Start Time 1030      Past Medical History:  Diagnosis Date  . Abnormal finding of blood chemistry   . Bradycardia 07/01/2014  . CAP (community acquired pneumonia) 06/29/2014  . Heart murmur   . Irregular heart rate   . Melanoma ([place])   . Prostate cancer ([place])   . Thrombocytopenia ([place]) 06/30/2014    Past Surgical History:  Procedure Laterality Date  . BACK SURGERY    . CATARACT EXTRACTION    . KNEE SURGERY    . MELANOMA EXCISION      There were no vitals filed for this visit.      Subjective Assessment - 08/04/16 1056    Subjective I felt very good after last treatment.  I walked through 3 "big box stores".  By the third sore my back was hurting a lot.  Rxs are helping   Pertinent History Previous lumbar discectomy.   Limitations Standing;Walking   How long can you stand comfortably? 10-15 minutes.   How long can you walk comfortably? Short community distance.   Patient Stated Goals Use my left UE without pain.   Currently in Pain? Yes   Pain Score 3    Pain Location Back   Pain Orientation Lower                         [facility] Adult PT Treatment/Exercise - 08/04/16 0001      Exercises   Exercises Knee/Hip     Lumbar Exercises: Aerobic   Stationary Bike Nustep level 5 x  16 minutes.     Knee/Hip Exercises: Standing   Rocker Board --  8 mins PF/DF , balance      Modalities   Modalities Traction     Electrical Stimulation   Electrical Stimulation  Location low back.  13 mins IFC x 15 mins   Electrical Stimulation Goals Pain     Traction   Type of Traction Lumbar   Min (lbs) 5   Max (lbs) 95   Hold Time 99   Rest Time 5   Time 15                  PT Short Term Goals - 06/30/16 1103      PT SHORT TERM GOAL #1   Title Ind with initial HEP.   Time 2   Period Weeks   Status Achieved           PT Long Term Goals - 07/25/16 1357      PT LONG TERM GOAL #1   Title Ind with advanced HEP.   Time 8   Period Weeks   Status On-going     PT LONG TERM GOAL #2   Title Negative Romberg test.   Time 8   Period Weeks   Status On-going     PT LONG TERM GOAL #3   Title Perform ADL's with pain not > 4/10.  Time 8   Period Weeks   Status Partially Met  Patient reports yes but standing for washing dishes causes increased pain 07/25/2016               Plan - 08/04/16 1307    Clinical Impression Statement Pt arrived to clinic today doing fairly well and feels that traction and calf stretching have really helped with pain and balance. He was able to recover better today with LOBs and less assistance was needed. Pt has 2 visits left until DC and then he will probably join gym program. Traction was performed at 95#s again and tolerated well. LTG's 2-3 NM yet and are ongoing   Rehab Potential Good   PT Frequency 2x / week   PT Duration 8 weeks   PT Next Visit Plan Continue balance and traction per MPT POC.   PT Home Exercise Plan gastroc stretch   Consulted and Agree with Plan of Care Patient      Patient will benefit from skilled therapeutic intervention in order to improve the following deficits and impairments:  Decreased activity tolerance, Decreased balance, Decreased strength, Decreased range of motion, Pain  Visit Diagnosis: Chronic bilateral low back pain without sciatica  Unsteadiness on feet  Stiffness of left shoulder, not elsewhere classified  Muscle weakness (generalized)     Problem  List Patient Active Problem List   Diagnosis Date Noted  . Chronic left shoulder pain 04/20/2016  . Spinal stenosis 09/02/2015  . Numbness 09/02/2015  . Bradycardia 07/01/2014  . Thrombocytopenia ([place]) 06/30/2014  . CAP (community acquired pneumonia) 06/29/2014  . Hypoxia 06/29/2014  . HTN (hypertension) 06/29/2014  . Prostate cancer ([place]) 06/29/2014  . Hyponatremia 06/29/2014    Cruz Bong,CHRIS, PTA 08/04/2016, 1:19 PM  [facility] [address] [place], [place], [address] Phone: [phone]   Fax:  [phone]  Name: Thomas Vega MRN: [id number] Date of Birth: 1926-08-14

## 2016-08-08 ENCOUNTER — Encounter (INDEPENDENT_AMBULATORY_CARE_PROVIDER_SITE_OTHER): Payer: Medicare Other | Admitting: Ophthalmology

## 2016-08-08 DIAGNOSIS — H43813 Vitreous degeneration, bilateral: Secondary | ICD-10-CM

## 2016-08-08 DIAGNOSIS — H353123 Nonexudative age-related macular degeneration, left eye, advanced atrophic without subfoveal involvement: Secondary | ICD-10-CM

## 2016-08-08 DIAGNOSIS — I10 Essential (primary) hypertension: Secondary | ICD-10-CM | POA: Diagnosis not present

## 2016-08-08 DIAGNOSIS — H35033 Hypertensive retinopathy, bilateral: Secondary | ICD-10-CM | POA: Diagnosis not present

## 2016-08-08 DIAGNOSIS — H353211 Exudative age-related macular degeneration, right eye, with active choroidal neovascularization: Secondary | ICD-10-CM

## 2016-08-09 ENCOUNTER — Encounter: Payer: Medicare Other | Admitting: *Deleted

## 2016-08-11 ENCOUNTER — Ambulatory Visit: Payer: Medicare Other | Admitting: *Deleted

## 2016-08-11 DIAGNOSIS — M25612 Stiffness of left shoulder, not elsewhere classified: Secondary | ICD-10-CM | POA: Diagnosis not present

## 2016-08-11 DIAGNOSIS — R2681 Unsteadiness on feet: Secondary | ICD-10-CM

## 2016-08-11 DIAGNOSIS — M6281 Muscle weakness (generalized): Secondary | ICD-10-CM

## 2016-08-11 DIAGNOSIS — M545 Low back pain, unspecified: Secondary | ICD-10-CM

## 2016-08-11 DIAGNOSIS — G8929 Other chronic pain: Secondary | ICD-10-CM

## 2016-08-11 NOTE — Therapy (Addendum)
[facility] [place], [place], [address] Phone: [phone]   Fax:  [phone]  Physical Therapy Treatment  Patient Details  Name: Thomas Vega MRN: [id number] Date of Birth: [birth year] Referring Provider: Basil Dess MD  Encounter Date: 08/11/2016      PT End of Session - 08/11/16 1054    Visit Number 15   Number of Visits 16   Authorization Type Gcode 10th visit ,  needs KX modifier   PT Start Time 1030   PT Stop Time 1130   PT Time Calculation (min) 60 min      Past Medical History:  Diagnosis Date  . Abnormal finding of blood chemistry   . Bradycardia 07/01/2014  . CAP (community acquired pneumonia) 06/29/2014  . Heart murmur   . Irregular heart rate   . Melanoma ([place])   . Prostate cancer ([place])   . Thrombocytopenia ([place]) 06/30/2014    Past Surgical History:  Procedure Laterality Date  . BACK SURGERY    . CATARACT EXTRACTION    . KNEE SURGERY    . MELANOMA EXCISION      There were no vitals filed for this visit.                       [facility] Adult PT Treatment/Exercise - 08/11/16 0001      Exercises   Exercises Knee/Hip     Lumbar Exercises: Aerobic   Stationary Bike Nustep level 5 x  54mnutes.     Knee/Hip Exercises: Standing   Walking with Sports Cord Pink XTS resisted walking x10 reps each  with SBA today. No LOB     Modalities   Modalities Electrical Stimulation;Moist Heat     Moist Heat Therapy   Number Minutes Moist Heat 15 Minutes   Moist Heat Location Lumbar Spine     Electrical Stimulation   Electrical Stimulation Location low back.   IFC x 15 mins 80-'150hz'$    Electrical Stimulation Goals Pain     Traction   Type of Traction Lumbar   Min (lbs) 5   Max (lbs) 95   Hold Time 99   Rest Time 5   Time 15                  PT Short Term Goals - 06/30/16 1103      PT SHORT TERM GOAL #1   Title Ind with initial HEP.   Time 2   Period Weeks   Status  Achieved           PT Long Term Goals - 07/25/16 1357      PT LONG TERM GOAL #1   Title Ind with advanced HEP.   Time 8   Period Weeks   Status On-going     PT LONG TERM GOAL #2   Title Negative Romberg test.   Time 8   Period Weeks   Status On-going     PT LONG TERM GOAL #3   Title Perform ADL's with pain not > 4/10.   Time 8   Period Weeks   Status Partially Met  Patient reports yes but standing for washing dishes causes increased pain 07/25/2016               Plan - 08/11/16 1322    Clinical Impression Statement Pt did fairly well with Rx today and was able to perform 1700 steps in 15 mins and during balance Act.'s he needed only SBA  and no LOB today. DC after next visit. Traction at 95#s again today and tolerated well.   Rehab Potential Good   PT Frequency 2x / week   PT Duration 8 weeks   PT Treatment/Interventions ADLs/Self Care Home Management;Cryotherapy;Electrical Stimulation;Moist Heat;Traction;Ultrasound;Gait training;Therapeutic activities;Therapeutic exercise;Balance training;Neuromuscular re-education;Patient/family education   PT Next Visit Plan Continue balance and traction per MPT POC.   PT Home Exercise Plan gastroc stretch   Consulted and Agree with Plan of Care Patient      Patient will benefit from skilled therapeutic intervention in order to improve the following deficits and impairments:  Decreased activity tolerance, Decreased balance, Decreased strength, Decreased range of motion, Pain  Visit Diagnosis: Chronic bilateral low back pain without sciatica  Unsteadiness on feet  Stiffness of left shoulder, not elsewhere classified  Muscle weakness (generalized)     Problem List Patient Active Problem List   Diagnosis Date Noted  . Chronic left shoulder pain 04/20/2016  . Spinal stenosis 09/02/2015  . Numbness 09/02/2015  . Bradycardia 07/01/2014  . Thrombocytopenia ([place]) 06/30/2014  . CAP (community acquired pneumonia)  06/29/2014  . Hypoxia 06/29/2014  . HTN (hypertension) 06/29/2014  . Prostate cancer ([place]) 06/29/2014  . Hyponatremia 06/29/2014    Elana Jian,CHRIS, PTA 08/11/2016, 1:27 PM  [facility] [address] [place], [place], [address] Phone: [phone]   Fax:  [phone]  Name: Thomas Vega MRN: [id number] Date of Birth: [birth year]  PHYSICAL THERAPY DISCHARGE SUMMARY  Visits from Start of Care: 15.  Current functional level related to goals / functional outcomes: See above.   Remaining deficits: See goal section.   Education / Equipment: HEP. Plan: Patient agrees to discharge.  Patient goals were not met. Patient is being discharged due to being pleased with the current functional level.  ?????         Mali Applegate MPT

## 2016-08-18 ENCOUNTER — Encounter: Payer: Medicare Other | Admitting: *Deleted

## 2016-09-05 ENCOUNTER — Encounter (INDEPENDENT_AMBULATORY_CARE_PROVIDER_SITE_OTHER): Payer: Medicare Other | Admitting: Ophthalmology

## 2016-09-05 DIAGNOSIS — H43813 Vitreous degeneration, bilateral: Secondary | ICD-10-CM

## 2016-09-05 DIAGNOSIS — H353211 Exudative age-related macular degeneration, right eye, with active choroidal neovascularization: Secondary | ICD-10-CM | POA: Diagnosis not present

## 2016-09-05 DIAGNOSIS — H35033 Hypertensive retinopathy, bilateral: Secondary | ICD-10-CM | POA: Diagnosis not present

## 2016-09-05 DIAGNOSIS — H353122 Nonexudative age-related macular degeneration, left eye, intermediate dry stage: Secondary | ICD-10-CM | POA: Diagnosis not present

## 2016-09-05 DIAGNOSIS — I10 Essential (primary) hypertension: Secondary | ICD-10-CM

## 2016-09-12 DIAGNOSIS — I10 Essential (primary) hypertension: Secondary | ICD-10-CM | POA: Diagnosis not present

## 2016-09-12 DIAGNOSIS — Z125 Encounter for screening for malignant neoplasm of prostate: Secondary | ICD-10-CM | POA: Diagnosis not present

## 2016-09-12 DIAGNOSIS — N390 Urinary tract infection, site not specified: Secondary | ICD-10-CM | POA: Diagnosis not present

## 2016-09-14 DIAGNOSIS — L814 Other melanin hyperpigmentation: Secondary | ICD-10-CM | POA: Diagnosis not present

## 2016-09-14 DIAGNOSIS — L821 Other seborrheic keratosis: Secondary | ICD-10-CM | POA: Diagnosis not present

## 2016-09-14 DIAGNOSIS — D225 Melanocytic nevi of trunk: Secondary | ICD-10-CM | POA: Diagnosis not present

## 2016-09-14 DIAGNOSIS — Z85820 Personal history of malignant melanoma of skin: Secondary | ICD-10-CM | POA: Diagnosis not present

## 2016-09-19 DIAGNOSIS — Z0000 Encounter for general adult medical examination without abnormal findings: Secondary | ICD-10-CM | POA: Diagnosis not present

## 2016-09-19 DIAGNOSIS — C61 Malignant neoplasm of prostate: Secondary | ICD-10-CM | POA: Diagnosis not present

## 2016-09-19 DIAGNOSIS — I10 Essential (primary) hypertension: Secondary | ICD-10-CM | POA: Diagnosis not present

## 2016-10-03 ENCOUNTER — Encounter (INDEPENDENT_AMBULATORY_CARE_PROVIDER_SITE_OTHER): Payer: Medicare Other | Admitting: Ophthalmology

## 2016-10-03 DIAGNOSIS — I10 Essential (primary) hypertension: Secondary | ICD-10-CM | POA: Diagnosis not present

## 2016-10-03 DIAGNOSIS — H353124 Nonexudative age-related macular degeneration, left eye, advanced atrophic with subfoveal involvement: Secondary | ICD-10-CM | POA: Diagnosis not present

## 2016-10-03 DIAGNOSIS — H353211 Exudative age-related macular degeneration, right eye, with active choroidal neovascularization: Secondary | ICD-10-CM

## 2016-10-03 DIAGNOSIS — H35033 Hypertensive retinopathy, bilateral: Secondary | ICD-10-CM

## 2016-10-03 DIAGNOSIS — H43813 Vitreous degeneration, bilateral: Secondary | ICD-10-CM | POA: Diagnosis not present

## 2016-10-31 ENCOUNTER — Encounter (INDEPENDENT_AMBULATORY_CARE_PROVIDER_SITE_OTHER): Payer: Medicare Other | Admitting: Ophthalmology

## 2016-10-31 DIAGNOSIS — H353122 Nonexudative age-related macular degeneration, left eye, intermediate dry stage: Secondary | ICD-10-CM

## 2016-10-31 DIAGNOSIS — I10 Essential (primary) hypertension: Secondary | ICD-10-CM | POA: Diagnosis not present

## 2016-10-31 DIAGNOSIS — H353211 Exudative age-related macular degeneration, right eye, with active choroidal neovascularization: Secondary | ICD-10-CM | POA: Diagnosis not present

## 2016-10-31 DIAGNOSIS — H35033 Hypertensive retinopathy, bilateral: Secondary | ICD-10-CM | POA: Diagnosis not present

## 2016-10-31 DIAGNOSIS — H43813 Vitreous degeneration, bilateral: Secondary | ICD-10-CM

## 2016-11-29 ENCOUNTER — Encounter (INDEPENDENT_AMBULATORY_CARE_PROVIDER_SITE_OTHER): Payer: Medicare Other | Admitting: Ophthalmology

## 2016-11-29 DIAGNOSIS — I10 Essential (primary) hypertension: Secondary | ICD-10-CM

## 2016-11-29 DIAGNOSIS — H43813 Vitreous degeneration, bilateral: Secondary | ICD-10-CM | POA: Diagnosis not present

## 2016-11-29 DIAGNOSIS — H353211 Exudative age-related macular degeneration, right eye, with active choroidal neovascularization: Secondary | ICD-10-CM

## 2016-11-29 DIAGNOSIS — H35033 Hypertensive retinopathy, bilateral: Secondary | ICD-10-CM | POA: Diagnosis not present

## 2016-11-29 DIAGNOSIS — H353122 Nonexudative age-related macular degeneration, left eye, intermediate dry stage: Secondary | ICD-10-CM | POA: Diagnosis not present

## 2016-12-26 ENCOUNTER — Encounter (INDEPENDENT_AMBULATORY_CARE_PROVIDER_SITE_OTHER): Payer: Medicare Other | Admitting: Ophthalmology

## 2016-12-26 DIAGNOSIS — I10 Essential (primary) hypertension: Secondary | ICD-10-CM

## 2016-12-26 DIAGNOSIS — H353211 Exudative age-related macular degeneration, right eye, with active choroidal neovascularization: Secondary | ICD-10-CM

## 2016-12-26 DIAGNOSIS — H353122 Nonexudative age-related macular degeneration, left eye, intermediate dry stage: Secondary | ICD-10-CM

## 2016-12-26 DIAGNOSIS — H43813 Vitreous degeneration, bilateral: Secondary | ICD-10-CM | POA: Diagnosis not present

## 2016-12-26 DIAGNOSIS — H35033 Hypertensive retinopathy, bilateral: Secondary | ICD-10-CM | POA: Diagnosis not present

## 2017-01-11 DIAGNOSIS — Z23 Encounter for immunization: Secondary | ICD-10-CM | POA: Diagnosis not present

## 2017-01-11 DIAGNOSIS — C61 Malignant neoplasm of prostate: Secondary | ICD-10-CM | POA: Diagnosis not present

## 2017-01-13 ENCOUNTER — Other Ambulatory Visit: Payer: Self-pay | Admitting: Urology

## 2017-01-13 DIAGNOSIS — C61 Malignant neoplasm of prostate: Secondary | ICD-10-CM

## 2017-01-25 ENCOUNTER — Encounter (INDEPENDENT_AMBULATORY_CARE_PROVIDER_SITE_OTHER): Payer: Medicare Other | Admitting: Ophthalmology

## 2017-01-25 ENCOUNTER — Other Ambulatory Visit (HOSPITAL_COMMUNITY): Payer: Medicare Other

## 2017-01-25 ENCOUNTER — Ambulatory Visit (HOSPITAL_COMMUNITY): Payer: Medicare Other

## 2017-01-25 DIAGNOSIS — H35033 Hypertensive retinopathy, bilateral: Secondary | ICD-10-CM

## 2017-01-25 DIAGNOSIS — H353211 Exudative age-related macular degeneration, right eye, with active choroidal neovascularization: Secondary | ICD-10-CM

## 2017-01-25 DIAGNOSIS — H43812 Vitreous degeneration, left eye: Secondary | ICD-10-CM

## 2017-01-25 DIAGNOSIS — H353122 Nonexudative age-related macular degeneration, left eye, intermediate dry stage: Secondary | ICD-10-CM | POA: Diagnosis not present

## 2017-01-25 DIAGNOSIS — I10 Essential (primary) hypertension: Secondary | ICD-10-CM

## 2017-02-01 ENCOUNTER — Ambulatory Visit (HOSPITAL_COMMUNITY)
Admission: RE | Admit: 2017-02-01 | Discharge: 2017-02-01 | Disposition: A | Discharge status: Home / Self Care | Payer: Medicare Other | Source: Ambulatory Visit | Attending: Urology | Admitting: Urology

## 2017-02-01 ENCOUNTER — Other Ambulatory Visit: Payer: Self-pay | Admitting: Urology

## 2017-02-01 DIAGNOSIS — M76899 Other specified enthesopathies of unspecified lower limb, excluding foot: Secondary | ICD-10-CM | POA: Insufficient documentation

## 2017-02-01 DIAGNOSIS — C61 Malignant neoplasm of prostate: Secondary | ICD-10-CM

## 2017-02-01 DIAGNOSIS — R9389 Abnormal findings on diagnostic imaging of other specified body structures: Secondary | ICD-10-CM | POA: Diagnosis not present

## 2017-02-01 MED ORDER — IOPAMIDOL (ISOVUE-300) INJECTION 61%
INTRAVENOUS | Status: AC
  Filled 2017-02-01: qty 100

## 2017-02-01 MED ORDER — TECHNETIUM TC 99M MEDRONATE IV KIT
21.7000 | PACK | Freq: Once | INTRAVENOUS | Status: AC
  Administered 2017-02-01: 21.7 via INTRAVENOUS

## 2017-02-01 MED ORDER — IOPAMIDOL (ISOVUE-300) INJECTION 61%
100.0000 mL | Freq: Once | INTRAVENOUS | Status: AC | PRN
  Administered 2017-02-01: 100 mL via INTRAVENOUS

## 2017-02-15 ENCOUNTER — Other Ambulatory Visit: Payer: Self-pay | Admitting: Urology

## 2017-02-15 DIAGNOSIS — R3912 Poor urinary stream: Secondary | ICD-10-CM | POA: Diagnosis not present

## 2017-02-15 DIAGNOSIS — C61 Malignant neoplasm of prostate: Secondary | ICD-10-CM | POA: Diagnosis not present

## 2017-02-15 DIAGNOSIS — N403 Nodular prostate with lower urinary tract symptoms: Secondary | ICD-10-CM | POA: Diagnosis not present

## 2017-02-15 DIAGNOSIS — R972 Elevated prostate specific antigen [PSA]: Secondary | ICD-10-CM | POA: Diagnosis not present

## 2017-02-23 ENCOUNTER — Encounter (INDEPENDENT_AMBULATORY_CARE_PROVIDER_SITE_OTHER): Payer: Medicare Other | Admitting: Ophthalmology

## 2017-02-23 DIAGNOSIS — H353122 Nonexudative age-related macular degeneration, left eye, intermediate dry stage: Secondary | ICD-10-CM | POA: Diagnosis not present

## 2017-02-23 DIAGNOSIS — H35033 Hypertensive retinopathy, bilateral: Secondary | ICD-10-CM | POA: Diagnosis not present

## 2017-02-23 DIAGNOSIS — H353211 Exudative age-related macular degeneration, right eye, with active choroidal neovascularization: Secondary | ICD-10-CM

## 2017-02-23 DIAGNOSIS — H43813 Vitreous degeneration, bilateral: Secondary | ICD-10-CM | POA: Diagnosis not present

## 2017-02-23 DIAGNOSIS — I10 Essential (primary) hypertension: Secondary | ICD-10-CM | POA: Diagnosis not present

## 2017-02-27 ENCOUNTER — Ambulatory Visit (HOSPITAL_COMMUNITY): Payer: Medicare Other

## 2017-02-28 ENCOUNTER — Ambulatory Visit (HOSPITAL_COMMUNITY)
Admission: RE | Admit: 2017-02-28 | Discharge: 2017-02-28 | Disposition: A | Discharge status: Home / Self Care | Payer: Medicare Other | Source: Ambulatory Visit | Attending: Urology | Admitting: Urology

## 2017-02-28 DIAGNOSIS — R937 Abnormal findings on diagnostic imaging of other parts of musculoskeletal system: Secondary | ICD-10-CM | POA: Diagnosis not present

## 2017-02-28 DIAGNOSIS — C61 Malignant neoplasm of prostate: Secondary | ICD-10-CM | POA: Diagnosis not present

## 2017-02-28 DIAGNOSIS — R972 Elevated prostate specific antigen [PSA]: Secondary | ICD-10-CM | POA: Diagnosis not present

## 2017-02-28 LAB — POCT I-STAT CREATININE: CREATININE: 0.6 mg/dL — AB (ref 0.61–1.24)

## 2017-02-28 MED ORDER — GADOBENATE DIMEGLUMINE 529 MG/ML IV SOLN
20.0000 mL | Freq: Once | INTRAVENOUS | Status: AC | PRN
  Administered 2017-02-28: 18 mL via INTRAVENOUS

## 2017-03-13 DIAGNOSIS — I10 Essential (primary) hypertension: Secondary | ICD-10-CM | POA: Diagnosis not present

## 2017-03-15 DIAGNOSIS — R739 Hyperglycemia, unspecified: Secondary | ICD-10-CM | POA: Diagnosis not present

## 2017-03-15 DIAGNOSIS — D649 Anemia, unspecified: Secondary | ICD-10-CM | POA: Diagnosis not present

## 2017-03-15 DIAGNOSIS — C61 Malignant neoplasm of prostate: Secondary | ICD-10-CM | POA: Diagnosis not present

## 2017-03-15 DIAGNOSIS — I10 Essential (primary) hypertension: Secondary | ICD-10-CM | POA: Diagnosis not present

## 2017-03-16 ENCOUNTER — Encounter (INDEPENDENT_AMBULATORY_CARE_PROVIDER_SITE_OTHER): Payer: Medicare Other | Admitting: Ophthalmology

## 2017-03-16 DIAGNOSIS — H353122 Nonexudative age-related macular degeneration, left eye, intermediate dry stage: Secondary | ICD-10-CM | POA: Diagnosis not present

## 2017-03-16 DIAGNOSIS — H353211 Exudative age-related macular degeneration, right eye, with active choroidal neovascularization: Secondary | ICD-10-CM | POA: Diagnosis not present

## 2017-03-16 DIAGNOSIS — H43813 Vitreous degeneration, bilateral: Secondary | ICD-10-CM | POA: Diagnosis not present

## 2017-03-16 DIAGNOSIS — I10 Essential (primary) hypertension: Secondary | ICD-10-CM | POA: Diagnosis not present

## 2017-03-16 DIAGNOSIS — H35033 Hypertensive retinopathy, bilateral: Secondary | ICD-10-CM

## 2017-03-30 DIAGNOSIS — C61 Malignant neoplasm of prostate: Secondary | ICD-10-CM | POA: Diagnosis not present

## 2017-03-30 DIAGNOSIS — R3912 Poor urinary stream: Secondary | ICD-10-CM | POA: Diagnosis not present

## 2017-03-30 DIAGNOSIS — R972 Elevated prostate specific antigen [PSA]: Secondary | ICD-10-CM | POA: Diagnosis not present

## 2017-03-30 DIAGNOSIS — N403 Nodular prostate with lower urinary tract symptoms: Secondary | ICD-10-CM | POA: Diagnosis not present

## 2017-04-13 ENCOUNTER — Encounter (INDEPENDENT_AMBULATORY_CARE_PROVIDER_SITE_OTHER): Payer: Medicare Other | Admitting: Ophthalmology

## 2017-04-13 DIAGNOSIS — H43813 Vitreous degeneration, bilateral: Secondary | ICD-10-CM

## 2017-04-13 DIAGNOSIS — I10 Essential (primary) hypertension: Secondary | ICD-10-CM | POA: Diagnosis not present

## 2017-04-13 DIAGNOSIS — H353211 Exudative age-related macular degeneration, right eye, with active choroidal neovascularization: Secondary | ICD-10-CM

## 2017-04-13 DIAGNOSIS — H35033 Hypertensive retinopathy, bilateral: Secondary | ICD-10-CM | POA: Diagnosis not present

## 2017-04-13 DIAGNOSIS — H353122 Nonexudative age-related macular degeneration, left eye, intermediate dry stage: Secondary | ICD-10-CM | POA: Diagnosis not present

## 2017-05-10 ENCOUNTER — Encounter (INDEPENDENT_AMBULATORY_CARE_PROVIDER_SITE_OTHER): Payer: Medicare Other | Admitting: Ophthalmology

## 2017-05-15 ENCOUNTER — Encounter (INDEPENDENT_AMBULATORY_CARE_PROVIDER_SITE_OTHER): Payer: Medicare Other | Admitting: Ophthalmology

## 2017-05-16 ENCOUNTER — Encounter (INDEPENDENT_AMBULATORY_CARE_PROVIDER_SITE_OTHER): Payer: Medicare Other | Admitting: Ophthalmology

## 2017-05-22 ENCOUNTER — Encounter (INDEPENDENT_AMBULATORY_CARE_PROVIDER_SITE_OTHER): Payer: Medicare Other | Admitting: Ophthalmology

## 2017-05-22 DIAGNOSIS — H353211 Exudative age-related macular degeneration, right eye, with active choroidal neovascularization: Secondary | ICD-10-CM | POA: Diagnosis not present

## 2017-05-22 DIAGNOSIS — H35033 Hypertensive retinopathy, bilateral: Secondary | ICD-10-CM | POA: Diagnosis not present

## 2017-05-22 DIAGNOSIS — H43813 Vitreous degeneration, bilateral: Secondary | ICD-10-CM

## 2017-05-22 DIAGNOSIS — H353122 Nonexudative age-related macular degeneration, left eye, intermediate dry stage: Secondary | ICD-10-CM

## 2017-05-22 DIAGNOSIS — I10 Essential (primary) hypertension: Secondary | ICD-10-CM | POA: Diagnosis not present

## 2017-05-24 DIAGNOSIS — D229 Melanocytic nevi, unspecified: Secondary | ICD-10-CM | POA: Diagnosis not present

## 2017-05-24 DIAGNOSIS — Z85820 Personal history of malignant melanoma of skin: Secondary | ICD-10-CM | POA: Diagnosis not present

## 2017-05-24 DIAGNOSIS — D1801 Hemangioma of skin and subcutaneous tissue: Secondary | ICD-10-CM | POA: Diagnosis not present

## 2017-05-24 DIAGNOSIS — L821 Other seborrheic keratosis: Secondary | ICD-10-CM | POA: Diagnosis not present

## 2017-05-24 DIAGNOSIS — L814 Other melanin hyperpigmentation: Secondary | ICD-10-CM | POA: Diagnosis not present

## 2017-06-19 ENCOUNTER — Encounter (INDEPENDENT_AMBULATORY_CARE_PROVIDER_SITE_OTHER): Payer: Medicare Other | Admitting: Ophthalmology

## 2017-06-19 DIAGNOSIS — H35033 Hypertensive retinopathy, bilateral: Secondary | ICD-10-CM

## 2017-06-19 DIAGNOSIS — H353211 Exudative age-related macular degeneration, right eye, with active choroidal neovascularization: Secondary | ICD-10-CM | POA: Diagnosis not present

## 2017-06-19 DIAGNOSIS — H353122 Nonexudative age-related macular degeneration, left eye, intermediate dry stage: Secondary | ICD-10-CM

## 2017-06-19 DIAGNOSIS — H43813 Vitreous degeneration, bilateral: Secondary | ICD-10-CM | POA: Diagnosis not present

## 2017-06-19 DIAGNOSIS — I10 Essential (primary) hypertension: Secondary | ICD-10-CM

## 2017-06-29 DIAGNOSIS — C61 Malignant neoplasm of prostate: Secondary | ICD-10-CM | POA: Diagnosis not present

## 2017-07-06 DIAGNOSIS — N403 Nodular prostate with lower urinary tract symptoms: Secondary | ICD-10-CM | POA: Diagnosis not present

## 2017-07-06 DIAGNOSIS — R3912 Poor urinary stream: Secondary | ICD-10-CM | POA: Diagnosis not present

## 2017-07-06 DIAGNOSIS — C61 Malignant neoplasm of prostate: Secondary | ICD-10-CM | POA: Diagnosis not present

## 2017-07-24 ENCOUNTER — Encounter (INDEPENDENT_AMBULATORY_CARE_PROVIDER_SITE_OTHER): Payer: Medicare Other | Admitting: Ophthalmology

## 2017-07-24 DIAGNOSIS — H35033 Hypertensive retinopathy, bilateral: Secondary | ICD-10-CM

## 2017-07-24 DIAGNOSIS — H43813 Vitreous degeneration, bilateral: Secondary | ICD-10-CM | POA: Diagnosis not present

## 2017-07-24 DIAGNOSIS — I10 Essential (primary) hypertension: Secondary | ICD-10-CM | POA: Diagnosis not present

## 2017-07-24 DIAGNOSIS — H353122 Nonexudative age-related macular degeneration, left eye, intermediate dry stage: Secondary | ICD-10-CM | POA: Diagnosis not present

## 2017-07-24 DIAGNOSIS — H353211 Exudative age-related macular degeneration, right eye, with active choroidal neovascularization: Secondary | ICD-10-CM | POA: Diagnosis not present

## 2017-08-16 ENCOUNTER — Ambulatory Visit (INDEPENDENT_AMBULATORY_CARE_PROVIDER_SITE_OTHER): Payer: Medicare Other | Admitting: Specialist

## 2017-08-17 ENCOUNTER — Telehealth (INDEPENDENT_AMBULATORY_CARE_PROVIDER_SITE_OTHER): Payer: Self-pay | Admitting: Specialist

## 2017-08-17 ENCOUNTER — Ambulatory Visit (INDEPENDENT_AMBULATORY_CARE_PROVIDER_SITE_OTHER): Payer: Medicare Other | Admitting: Specialist

## 2017-08-17 ENCOUNTER — Encounter (INDEPENDENT_AMBULATORY_CARE_PROVIDER_SITE_OTHER): Payer: Self-pay | Admitting: Specialist

## 2017-08-17 ENCOUNTER — Ambulatory Visit (INDEPENDENT_AMBULATORY_CARE_PROVIDER_SITE_OTHER): Payer: Self-pay

## 2017-08-17 VITALS — BP 162/77 | HR 49 | Ht 74.0 in | Wt 195.0 lb

## 2017-08-17 DIAGNOSIS — M48062 Spinal stenosis, lumbar region with neurogenic claudication: Secondary | ICD-10-CM

## 2017-08-17 NOTE — Patient Instructions (Addendum)
Avoid bending, stooping and avoid lifting weights greater than 10 lbs. Avoid prolong standing and walking. Avoid frequent bending and stooping  No lifting greater than 10 lbs. May use ice or moist heat for pain. Weight loss is of benefit. Handicap license is approved. Dr. Romona Curls secretary/Assistant will call to arrange for epidural steroid injection  Physical therapy with [facility] PT

## 2017-08-17 NOTE — Telephone Encounter (Signed)
Patient needs an appointment around 09/14/17.  He is currently scheduled for July 11th at 3:15.  Please place patient on your cancellation list.  [phone].  Thank you.

## 2017-08-17 NOTE — Progress Notes (Signed)
Office Visit Note   Patient: Thomas Vega           Date of Birth: 07/30/1926           MRN: [id number] Visit Date: 08/17/2017              Requested by: Jani Gravel, MD [address] [place], [place] [address] PCP: Jani Gravel, MD   Assessment & Plan: Visit Diagnoses:  1. Spinal stenosis of lumbar region with neurogenic claudication     Plan: Avoid bending, stooping and avoid lifting weights greater than 10 lbs. Avoid prolong standing and walking. Avoid frequent bending and stooping  No lifting greater than 10 lbs. May use ice or moist heat for pain. Weight loss is of benefit. Handicap license is approved. Dr. Romona Curls secretary/Assistant will call to arrange for epidural steroid injection   Follow-Up Instructions: No follow-ups on file.   Orders:  Orders Placed This Encounter  Procedures  . XR Lumbar Spine 2-3 Views   No orders of the defined types were placed in this encounter.     Procedures: No procedures performed   Clinical Data: No additional findings.   Subjective: Chief Complaint  Patient presents with  . Lower Back - Pain    84 year old male with history of back pain and radiation into the buttocks and thighs. He has pain with standing and walking. In the past ESI have been of  Benefit and the last was done nearly 2 years ago. No bowel or bladder difficulty. No leg pain or numbness that is persistent.    Review of Systems  Constitutional: Negative.   HENT: Negative.   Eyes: Negative.   Respiratory: Negative.   Cardiovascular: Negative.   Gastrointestinal: Negative.   Endocrine: Negative.   Genitourinary: Negative.   Musculoskeletal: Negative.   Skin: Negative.   Allergic/Immunologic: Negative.   Neurological: Negative.   Hematological: Negative.   Psychiatric/Behavioral: Negative.      Objective: Vital Signs: BP (!) 162/77 (BP Location: Left Arm, Patient Position: Sitting)   Pulse (!) 49   Ht 6\' 2"  (1.88 m)   Wt  195 lb (88.5 kg)   BMI 25.04 kg/m   Physical Exam  Constitutional: He is oriented to person, place, and time. He appears well-developed and well-nourished.  HENT:  Head: Normocephalic and atraumatic.  Eyes: Pupils are equal, round, and reactive to light. EOM are normal.  Neck: Normal range of motion. Neck supple.  Pulmonary/Chest: Effort normal and breath sounds normal.  Abdominal: Soft. Bowel sounds are normal.  Neurological: He is alert and oriented to person, place, and time.  Skin: Skin is warm and dry.  Psychiatric: He has a normal mood and affect. His behavior is normal. Judgment and thought content normal.    Back Exam   Tenderness  The patient is experiencing tenderness in the lumbar.  Range of Motion  Extension: abnormal  Flexion: abnormal  Lateral bend right: abnormal  Lateral bend left: abnormal  Rotation right: abnormal  Rotation left: abnormal   Muscle Strength  Right Quadriceps:  4/5  Left Quadriceps:  4/5  Right Hamstrings:  4/5  Left Hamstrings:  4/5   Tests  Straight leg raise right: negative Straight leg raise left: negative  Reflexes  Patellar: normal Achilles: normal Biceps: normal Babinski's sign: normal   Other  Toe walk: normal Heel walk: normal Sensation: decreased Gait: normal  Erythema: no back redness Scars: absent      Specialty Comments:  No specialty  comments available.  Imaging: No results found.   PMFS History: Patient Active Problem List   Diagnosis Date Noted  . Chronic left shoulder pain 04/20/2016  . Spinal stenosis 09/02/2015  . Numbness 09/02/2015  . Bradycardia 07/01/2014  . Thrombocytopenia ([place]) 06/30/2014  . CAP (community acquired pneumonia) 06/29/2014  . Hypoxia 06/29/2014  . HTN (hypertension) 06/29/2014  . Prostate cancer ([place]) 06/29/2014  . Hyponatremia 06/29/2014   Past Medical History:  Diagnosis Date  . Abnormal finding of blood chemistry   . Bradycardia 07/01/2014  . CAP (community  acquired pneumonia) 06/29/2014  . Heart murmur   . Irregular heart rate   . Melanoma ([place])   . Prostate cancer ([place])   . Thrombocytopenia ([place]) 06/30/2014    Family History  Problem Relation Age of Onset  . Heart Problems Mother   . Stroke Father     Past Surgical History:  Procedure Laterality Date  . BACK SURGERY    . CATARACT EXTRACTION    . KNEE SURGERY    . MELANOMA EXCISION     Social History   Occupational History  . Occupation: Retired   Tobacco Use  . Smoking status: Never Smoker  . Smokeless tobacco: Never Used  Substance and Sexual Activity  . Alcohol use: No    Alcohol/week: 0.0 oz  . Drug use: No  . Sexual activity: Not on file

## 2017-08-18 NOTE — Telephone Encounter (Signed)
I put pt on cancellation list

## 2017-08-28 ENCOUNTER — Encounter (INDEPENDENT_AMBULATORY_CARE_PROVIDER_SITE_OTHER): Payer: Medicare Other | Admitting: Ophthalmology

## 2017-08-28 DIAGNOSIS — H353211 Exudative age-related macular degeneration, right eye, with active choroidal neovascularization: Secondary | ICD-10-CM | POA: Diagnosis not present

## 2017-08-28 DIAGNOSIS — H35033 Hypertensive retinopathy, bilateral: Secondary | ICD-10-CM

## 2017-08-28 DIAGNOSIS — H43813 Vitreous degeneration, bilateral: Secondary | ICD-10-CM

## 2017-08-28 DIAGNOSIS — H353124 Nonexudative age-related macular degeneration, left eye, advanced atrophic with subfoveal involvement: Secondary | ICD-10-CM | POA: Diagnosis not present

## 2017-08-28 DIAGNOSIS — I10 Essential (primary) hypertension: Secondary | ICD-10-CM | POA: Diagnosis not present

## 2017-09-14 DIAGNOSIS — I10 Essential (primary) hypertension: Secondary | ICD-10-CM | POA: Diagnosis not present

## 2017-09-14 DIAGNOSIS — D649 Anemia, unspecified: Secondary | ICD-10-CM | POA: Diagnosis not present

## 2017-09-14 DIAGNOSIS — D638 Anemia in other chronic diseases classified elsewhere: Secondary | ICD-10-CM | POA: Diagnosis not present

## 2017-09-14 DIAGNOSIS — Z79899 Other long term (current) drug therapy: Secondary | ICD-10-CM | POA: Diagnosis not present

## 2017-09-14 DIAGNOSIS — C61 Malignant neoplasm of prostate: Secondary | ICD-10-CM | POA: Diagnosis not present

## 2017-09-18 ENCOUNTER — Encounter (INDEPENDENT_AMBULATORY_CARE_PROVIDER_SITE_OTHER): Payer: Self-pay | Admitting: Physical Medicine and Rehabilitation

## 2017-09-18 ENCOUNTER — Ambulatory Visit (INDEPENDENT_AMBULATORY_CARE_PROVIDER_SITE_OTHER): Payer: Medicare Other | Admitting: Physical Medicine and Rehabilitation

## 2017-09-18 ENCOUNTER — Ambulatory Visit (INDEPENDENT_AMBULATORY_CARE_PROVIDER_SITE_OTHER): Payer: Self-pay

## 2017-09-18 VITALS — BP 148/73 | HR 51

## 2017-09-18 DIAGNOSIS — M5416 Radiculopathy, lumbar region: Secondary | ICD-10-CM

## 2017-09-18 DIAGNOSIS — M961 Postlaminectomy syndrome, not elsewhere classified: Secondary | ICD-10-CM

## 2017-09-18 DIAGNOSIS — M48062 Spinal stenosis, lumbar region with neurogenic claudication: Secondary | ICD-10-CM

## 2017-09-18 MED ORDER — METHYLPREDNISOLONE ACETATE 80 MG/ML IJ SUSP
80.0000 mg | Freq: Once | INTRAMUSCULAR | Status: AC
  Administered 2017-09-18: 80 mg

## 2017-09-18 NOTE — Patient Instructions (Signed)

## 2017-09-18 NOTE — Progress Notes (Signed)
 .  Numeric Pain Rating Scale and Functional Assessment Average Pain 7   In the last MONTH (on 0-10 scale) has pain interfered with the following?  1. General activity like being  able to carry out your everyday physical activities such as walking, climbing stairs, carrying groceries, or moving a chair?  Rating(3)   +Driver, -BT, -Dye Allergies.  

## 2017-09-20 DIAGNOSIS — E871 Hypo-osmolality and hyponatremia: Secondary | ICD-10-CM | POA: Diagnosis not present

## 2017-09-20 DIAGNOSIS — R972 Elevated prostate specific antigen [PSA]: Secondary | ICD-10-CM | POA: Diagnosis not present

## 2017-09-20 DIAGNOSIS — Z79899 Other long term (current) drug therapy: Secondary | ICD-10-CM | POA: Diagnosis not present

## 2017-09-20 DIAGNOSIS — M4800 Spinal stenosis, site unspecified: Secondary | ICD-10-CM | POA: Diagnosis not present

## 2017-09-20 DIAGNOSIS — I10 Essential (primary) hypertension: Secondary | ICD-10-CM | POA: Diagnosis not present

## 2017-09-20 DIAGNOSIS — Z0000 Encounter for general adult medical examination without abnormal findings: Secondary | ICD-10-CM | POA: Diagnosis not present

## 2017-09-20 NOTE — Progress Notes (Signed)
Thomas Vega - 84 y.o. male MRN [id number]  Date of birth: 1926/10/05  Office Visit Note: Visit Date: 09/18/2017 PCP: Jani Gravel, MD Referred by: Jani Gravel, MD  Subjective: Chief Complaint  Patient presents with  . Lower Back - Pain   HPI: Thomas Vega is an 84 year old gentleman with chronic worsening severe really axial low back pain without radicular pain.  He comes in today at the request of Dr. Basil Dess for repeat epidural injection.  Last injection we performed was an L5-S1 intralaminar epidural steroid injection.  He has had prior laminectomy discectomy remotely at L5-S1 with laminotomy defect on the right at that level.  He did have a mild amount of ligamentum flavum left on MRI imaging from 2017 when we did the injection.  His pain is really centralized not left or right.  We are going to complete a left L5-S1 intralaminar injection just based on the fact that would probably be a little bit easier to do with ligamentum flavum intact on that side.  He has multilevel severe stenosis at L2-3 and L3-4 and L4-5.  Depending on relief would have Dr. Louanne Skye consider transforaminal injection versus diagnostic medial branch blocks.   ROS Otherwise per HPI.  Assessment & Plan: Visit Diagnoses:  1. Lumbar radiculopathy   2. Spinal stenosis of lumbar region with neurogenic claudication   3. Post laminectomy syndrome     Plan: No additional findings.   Meds & Orders:  Meds ordered this encounter  Medications  . methylPREDNISolone acetate (DEPO-MEDROL) injection 80 mg    Orders Placed This Encounter  Procedures  . XR C-ARM NO REPORT  . Epidural Steroid injection    Follow-up: Return for Dr. Basil Dess.   Procedures: No procedures performed  Lumbar Epidural Steroid Injection - Interlaminar Approach with Fluoroscopic Guidance  Patient: Thomas Vega      Date of Birth: 07-16-1926 MRN: [id number] PCP: Jani Gravel, MD      Visit Date: 09/18/2017   Universal Protocol:      Consent Given By: the patient  Position: PRONE  Additional Comments: Vital signs were monitored before and after the procedure. Patient was prepped and draped in the usual sterile fashion. The correct patient, procedure, and site was verified.   Injection Procedure Details:  Procedure Site One Meds Administered:  Meds ordered this encounter  Medications  . methylPREDNISolone acetate (DEPO-MEDROL) injection 80 mg     Laterality: Left  Location/Site:  L5-S1  Needle size: 20 G  Needle type: Tuohy  Needle Placement: Paramedian epidural  Findings:   -Comments: Excellent flow of contrast into the epidural space.  Procedure Details: Using a paramedian approach from the side mentioned above, the region overlying the inferior lamina was localized under fluoroscopic visualization and the soft tissues overlying this structure were infiltrated with 4 ml. of 1% Lidocaine without Epinephrine. The Tuohy needle was inserted into the epidural space using a paramedian approach.   The epidural space was localized using loss of resistance along with lateral and bi-planar fluoroscopic views.  After negative aspirate for air, blood, and CSF, a 2 ml. volume of Isovue-250 was injected into the epidural space and the flow of contrast was observed. Radiographs were obtained for documentation purposes.    The injectate was administered into the level noted above.   Additional Comments:  The patient tolerated the procedure well Dressing: Band-Aid    Post-procedure details: Patient was observed during the procedure. Post-procedure instructions were reviewed.  Patient left the clinic in stable  condition.   Clinical History: MRI LUMBAR SPINE WITHOUT CONTRAST  TECHNIQUE: Multiplanar, multisequence MR imaging of the lumbar spine was performed. No intravenous contrast was administered.  COMPARISON:  10/27/2011  FINDINGS: Segmentation: Conventional anatomy assistant, with the last  open disc space designated L5-S1.  Alignment: The vertebral bodies of the lumbar spine are normal in alignment.  Bones: The vertebral bodies of the lumbar spine are normal in size. There is normal bone marrow signal demonstrated throughout the vertebra. The visualized portions of the SI joints are unremarkable.  Conus medullaris: Extends to the T12-L1 level and appears normal. The nerve roots of the cauda equina and the filum terminale are normal.  Paraspinal and other soft tissues: There is no focal abnormality. The imaged intra-abdominal contents are unremarkable.  Disc levels:  Disc spaces: There is mild degenerative disc disease with disc height loss at L5-S1.  T12-L1: No significant disc bulge. No evidence of neural foraminal stenosis. No central canal stenosis.  L1-L2: Mild broad-based disc bulge. No evidence of neural foraminal stenosis. No central canal stenosis.  L2-L3: Mild broad-based disc bulge. Severe bilateral facet arthropathy. Severe spinal stenosis. No evidence of neural foraminal stenosis.  L3-L4: Mild broad-based disc bulge. Severe bilateral facet arthropathy and severe spinal stenosis. No evidence of neural foraminal stenosis.  L4-L5: Mild broad-based disc bulge. Severe bilateral facet arthropathy and severe spinal stenosis. No evidence of neural foraminal stenosis.  L5-S1: Mild broad-based disc bulge with a tiny right paracentral disc protrusion. No evidence of neural foraminal stenosis. No central canal stenosis.  IMPRESSION: 1. At L2-3 and L3-4 there are mild broad-based disc bulges with severe bilateral facet arthropathy and severe spinal stenosis. 2. At L4-5 there is a mild broad-based disc bulge with severe bilateral facet arthropathy and severe spinal stenosis.   Electronically Signed   By: Kathreen Devoid   On: 07/02/2015 15:18   He reports that he has never smoked. He has never used smokeless tobacco. No results for  input(s): HGBA1C, LABURIC in the last 8760 hours.  Objective:  VS:  HT:    WT:   BMI:     BP:(!) 148/73  HR:(!) 51bpm  TEMP: ( )  RESP:  Physical Exam  Ortho Exam Imaging: No results found.  Past Medical/Family/Surgical/Social History: Medications & Allergies reviewed per EMR, new medications updated. Patient Active Problem List   Diagnosis Date Noted  . Chronic left shoulder pain 04/20/2016  . Spinal stenosis 09/02/2015  . Numbness 09/02/2015  . Bradycardia 07/01/2014  . Thrombocytopenia ([place]) 06/30/2014  . CAP (community acquired pneumonia) 06/29/2014  . Hypoxia 06/29/2014  . HTN (hypertension) 06/29/2014  . Prostate cancer ([place]) 06/29/2014  . Hyponatremia 06/29/2014   Past Medical History:  Diagnosis Date  . Abnormal finding of blood chemistry   . Bradycardia 07/01/2014  . CAP (community acquired pneumonia) 06/29/2014  . Heart murmur   . Irregular heart rate   . Melanoma ([place])   . Prostate cancer ([place])   . Thrombocytopenia ([place]) 06/30/2014   Family History  Problem Relation Age of Onset  . Heart Problems Mother   . Stroke Father    Past Surgical History:  Procedure Laterality Date  . BACK SURGERY    . CATARACT EXTRACTION    . KNEE SURGERY    . MELANOMA EXCISION     Social History   Occupational History  . Occupation: Retired   Tobacco Use  . Smoking status: Never Smoker  . Smokeless tobacco: Never Used  Substance and Sexual Activity  .  Alcohol use: No    Alcohol/week: 0.0 oz  . Drug use: No  . Sexual activity: Not on file

## 2017-09-20 NOTE — Procedures (Signed)
Lumbar Epidural Steroid Injection - Interlaminar Approach with Fluoroscopic Guidance  Patient: Thomas Vega      Date of Birth: Jul 17, 1926 MRN: [id number] PCP: Jani Gravel, MD      Visit Date: 09/18/2017   Universal Protocol:     Consent Given By: the patient  Position: PRONE  Additional Comments: Vital signs were monitored before and after the procedure. Patient was prepped and draped in the usual sterile fashion. The correct patient, procedure, and site was verified.   Injection Procedure Details:  Procedure Site One Meds Administered:  Meds ordered this encounter  Medications  . methylPREDNISolone acetate (DEPO-MEDROL) injection 80 mg     Laterality: Left  Location/Site:  L5-S1  Needle size: 20 G  Needle type: Tuohy  Needle Placement: Paramedian epidural  Findings:   -Comments: Excellent flow of contrast into the epidural space.  Procedure Details: Using a paramedian approach from the side mentioned above, the region overlying the inferior lamina was localized under fluoroscopic visualization and the soft tissues overlying this structure were infiltrated with 4 ml. of 1% Lidocaine without Epinephrine. The Tuohy needle was inserted into the epidural space using a paramedian approach.   The epidural space was localized using loss of resistance along with lateral and bi-planar fluoroscopic views.  After negative aspirate for air, blood, and CSF, a 2 ml. volume of Isovue-250 was injected into the epidural space and the flow of contrast was observed. Radiographs were obtained for documentation purposes.    The injectate was administered into the level noted above.   Additional Comments:  The patient tolerated the procedure well Dressing: Band-Aid    Post-procedure details: Patient was observed during the procedure. Post-procedure instructions were reviewed.  Patient left the clinic in stable condition.

## 2017-09-25 ENCOUNTER — Encounter (INDEPENDENT_AMBULATORY_CARE_PROVIDER_SITE_OTHER): Payer: Medicare Other | Admitting: Ophthalmology

## 2017-09-25 DIAGNOSIS — H353122 Nonexudative age-related macular degeneration, left eye, intermediate dry stage: Secondary | ICD-10-CM

## 2017-09-25 DIAGNOSIS — H35033 Hypertensive retinopathy, bilateral: Secondary | ICD-10-CM | POA: Diagnosis not present

## 2017-09-25 DIAGNOSIS — I10 Essential (primary) hypertension: Secondary | ICD-10-CM | POA: Diagnosis not present

## 2017-09-25 DIAGNOSIS — H353211 Exudative age-related macular degeneration, right eye, with active choroidal neovascularization: Secondary | ICD-10-CM | POA: Diagnosis not present

## 2017-09-25 DIAGNOSIS — H43813 Vitreous degeneration, bilateral: Secondary | ICD-10-CM | POA: Diagnosis not present

## 2017-10-05 ENCOUNTER — Ambulatory Visit (INDEPENDENT_AMBULATORY_CARE_PROVIDER_SITE_OTHER): Payer: Medicare Other | Admitting: Specialist

## 2017-10-09 DIAGNOSIS — C61 Malignant neoplasm of prostate: Secondary | ICD-10-CM | POA: Diagnosis not present

## 2017-10-16 ENCOUNTER — Other Ambulatory Visit: Payer: Self-pay | Admitting: Urology

## 2017-10-16 DIAGNOSIS — R972 Elevated prostate specific antigen [PSA]: Secondary | ICD-10-CM | POA: Diagnosis not present

## 2017-10-16 DIAGNOSIS — N401 Enlarged prostate with lower urinary tract symptoms: Secondary | ICD-10-CM | POA: Diagnosis not present

## 2017-10-16 DIAGNOSIS — R3912 Poor urinary stream: Secondary | ICD-10-CM | POA: Diagnosis not present

## 2017-10-16 DIAGNOSIS — C61 Malignant neoplasm of prostate: Secondary | ICD-10-CM | POA: Diagnosis not present

## 2017-10-17 ENCOUNTER — Encounter: Payer: Self-pay | Admitting: Physical Therapy

## 2017-10-17 ENCOUNTER — Other Ambulatory Visit: Payer: Self-pay

## 2017-10-17 ENCOUNTER — Ambulatory Visit: Payer: Medicare Other | Attending: Specialist | Admitting: Physical Therapy

## 2017-10-17 DIAGNOSIS — R2681 Unsteadiness on feet: Secondary | ICD-10-CM

## 2017-10-17 DIAGNOSIS — M545 Low back pain: Secondary | ICD-10-CM | POA: Diagnosis not present

## 2017-10-17 DIAGNOSIS — G8929 Other chronic pain: Secondary | ICD-10-CM | POA: Diagnosis not present

## 2017-10-17 NOTE — Therapy (Signed)
[facility] [place], [place], [address] Phone: [phone]   Fax:  [phone]  Physical Therapy Evaluation  Patient Details  Name: Thomas Vega MRN: [id number] Date of Birth: 1926-09-24 Referring Provider: Basil Dess MD.   Encounter Date: 10/17/2017  PT End of Session - 10/17/17 1147    Visit Number  1    Number of Visits  16    Date for PT Re-Evaluation  01/15/18    PT Start Time  1324    PT Stop Time  1159    PT Time Calculation (min)  36 min    Activity Tolerance  Patient tolerated treatment well    Behavior During Therapy  [facility] for tasks assessed/performed       Past Medical History:  Diagnosis Date  . Abnormal finding of blood chemistry   . Bradycardia 07/01/2014  . CAP (community acquired pneumonia) 06/29/2014  . Heart murmur   . Irregular heart rate   . Melanoma ([place])   . Prostate cancer ([place])   . Thrombocytopenia ([place]) 06/30/2014    Past Surgical History:  Procedure Laterality Date  . BACK SURGERY    . CATARACT EXTRACTION    . KNEE SURGERY    . MELANOMA EXCISION      There were no vitals filed for this visit.   Subjective Assessment - 10/17/17 1215    Subjective  The patient presents to OPPT with c/o balance problems.  He states he now uses his cane much more often has he has fallen 2-3 times over the last 6 months.  He has had a long h/o LBP an cannot fully straighten his spine.      Pertinent History  Spinal stenosis; prostate cancer; back surgery and right total knee replacement.    How long can you walk comfortably?  Short community distances with a standing straight cane.    Patient Stated Goals  Walk better andnot fall.    Currently in Pain?  Yes    Pain Score  5     Pain Orientation  Lower;Mid    Pain Descriptors / Indicators  Aching    Pain Type  Chronic pain    Pain Onset  More than a month ago    Pain Frequency  Constant         [facility] PT Assessment - 10/17/17 0001      Assessment   Medical Diagnosis  Lumbar spinal stenosis; balance problems.    Referring Provider  Basil Dess MD.    Onset Date/Surgical Date  -- Ongoing.      Precautions   Precautions  Fall      Restrictions   Weight Bearing Restrictions  No      Balance Screen   Has the patient fallen in the past 6 months  Yes    How many times?  -- 2 to 3 times.    Has the patient had a decrease in activity level because of a fear of falling?   Yes    Is the patient reluctant to leave their home because of a fear of falling?   No      Home Environment   Living Environment  Private residence      Prior Function   Level of Independence  Independent with household mobility with device      Cognition   Overall Cognitive Status  Within Functional Limits for tasks assessed      Posture/Postural Control   Posture/Postural Control  Postural  limitations    Postural Limitations  Rounded Shoulders;Forward head;Decreased lumbar lordosis;Decreased thoracic kyphosis;Flexed trunk      ROM / Strength   AROM / PROM / Strength  AROM;Strength      AROM   Overall AROM Comments  WFL for bilateral LE's.  Patient standing in 17 degrees of spinal flexion and he can only extensd to -5 degrees.      Strength   Overall Strength Comments  Left hip abduction= 4/5 and right ankle dorsiflexion= 4/5.      Special Tests    Special Tests  -- (+) Romberg test.    Other special tests  -- Absent bilateral LE DTR's.      Transfers   Comments  Sit to stand with armrests used and supervision due to fall risk.      Ambulation/Gait   Gait Pattern  Scissoring;Ataxic;Trunk flexed    Gait Comments  Supervised gait with straight cane.      Standardized Balance Assessment   Standardized Balance Assessment  Berg Balance Test      Berg Balance Test   Sit to Stand  Able to stand  independently using hands    Standing Unsupported  Able to stand 30 seconds unsupported    Sitting with Back Unsupported but Feet Supported on  Floor or Stool  Able to sit safely and securely 2 minutes    Stand to Sit  Controls descent by using hands    Transfers  Able to transfer safely, definite need of hands    Standing Unsupported with Eyes Closed  Unable to keep eyes closed 3 seconds but stays steady    Standing Ubsupported with Feet Together  Needs help to attain position and unable to hold for 15 seconds    From Standing, Reach Forward with Outstretched Arm  Can reach confidently >25 cm (10")    From Standing Position, Pick up Object from Floor  Able to pick up shoe, needs supervision    From Standing Position, Turn to Look Behind Over each Shoulder  Looks behind one side only/other side shows less weight shift    Turn 360 Degrees  Able to turn 360 degrees safely but slowly    Standing Unsupported, Alternately Place Feet on Step/Stool  Able to complete 4 steps without aid or supervision    Standing Unsupported, One Foot in [employer] balance while stepping or standing    Standing on One Leg  Unable to try or needs assist to prevent fall    Total Score  30                Objective measurements completed on examination: See above findings.                PT Short Term Goals - 10/17/17 1234      PT SHORT TERM GOAL #1   Title  Ind with initial HEP.    Time  2    Period  Weeks    Status  New      PT SHORT TERM GOAL #2   Title  Improve Berg score to 33/56.    Time  2    Period  Weeks    Status  New        PT Long Term Goals - 10/17/17 1234      PT LONG TERM GOAL #1   Title  Ind with advanced HEP.    Time  8    Period  Weeks  Status  New      PT LONG TERM GOAL #2   Title  Negative Romberg test.    Time  8    Period  Weeks    Status  New      PT LONG TERM GOAL #3   Title  Improve Berg score to 46/56.             Plan - 10/17/17 1227    Clinical Impression Statement  The patient presents to OPPT with ongoing low back pain and balance problems.  His goal is therapy is to  improve his balance and walk better.  He has some mild LE weakness and his gait is ataxic in nature and he needs supervision at all times.  His Berg balance score was 30/56. Patient will benefit from skilled physical therapy intervention to addres deficits.      History and Personal Factors relevant to plan of care:  Spinal stenosis; prostate cancer; back surgery and right total knee replacement.    Clinical Presentation  Evolving    Clinical Presentation due to:  Balance worsening.    Clinical Decision Making  Moderate    Rehab Potential  Good    PT Frequency  2x / week    PT Duration  8 weeks    PT Treatment/Interventions  ADLs/Self Care Home Management;Balance training;Therapeutic exercise;Therapeutic activities;Gait training;Neuromuscular re-education;Patient/family education    PT Next Visit Plan  Balance program.  Please include LE and core strengthening.    Consulted and Agree with Plan of Care  Patient       Patient will benefit from skilled therapeutic intervention in order to improve the following deficits and impairments:  Abnormal gait, Decreased activity tolerance, Decreased balance, Decreased coordination, Postural dysfunction, Pain, Difficulty walking  Visit Diagnosis: Chronic bilateral low back pain without sciatica - Plan: PT plan of care cert/re-cert  Unsteadiness on feet - Plan: PT plan of care cert/re-cert     Problem List Patient Active Problem List   Diagnosis Date Noted  . Chronic left shoulder pain 04/20/2016  . Spinal stenosis 09/02/2015  . Numbness 09/02/2015  . Bradycardia 07/01/2014  . Thrombocytopenia ([place]) 06/30/2014  . CAP (community acquired pneumonia) 06/29/2014  . Hypoxia 06/29/2014  . HTN (hypertension) 06/29/2014  . Prostate cancer ([place]) 06/29/2014  . Hyponatremia 06/29/2014    Ohanna Gassert, [place] MPT 10/17/2017, 12:37 PM  [facility] [place], [place], [address] Phone: [phone]    Fax:  [phone]  Name: Thomas Vega MRN: [id number] Date of Birth: May 30, 1926

## 2017-10-18 ENCOUNTER — Ambulatory Visit: Payer: Medicare Other | Admitting: Physical Therapy

## 2017-10-18 DIAGNOSIS — R2681 Unsteadiness on feet: Secondary | ICD-10-CM

## 2017-10-18 DIAGNOSIS — M545 Low back pain: Principal | ICD-10-CM

## 2017-10-18 DIAGNOSIS — G8929 Other chronic pain: Secondary | ICD-10-CM | POA: Diagnosis not present

## 2017-10-18 NOTE — Therapy (Signed)
[facility] [place], [place], [address] Phone: [phone]   Fax:  [phone]  Physical Therapy Treatment  Patient Details  Name: Thomas Vega MRN: [id number] Date of Birth: November 25, 1926 Referring Provider: Basil Dess MD.   Encounter Date: 10/18/2017  PT End of Session - 10/18/17 1116    Visit Number  2    Number of Visits  16    Date for PT Re-Evaluation  01/15/18    PT Start Time  6962    PT Stop Time  1114 2 units secondary to late start to PT    PT Time Calculation (min)  38 min    Activity Tolerance  Patient tolerated treatment well    Behavior During Therapy  [facility] for tasks assessed/performed       Past Medical History:  Diagnosis Date  . Abnormal finding of blood chemistry   . Bradycardia 07/01/2014  . CAP (community acquired pneumonia) 06/29/2014  . Heart murmur   . Irregular heart rate   . Melanoma ([place])   . Prostate cancer ([place])   . Thrombocytopenia ([place]) 06/30/2014    Past Surgical History:  Procedure Laterality Date  . BACK SURGERY    . CATARACT EXTRACTION    . KNEE SURGERY    . MELANOMA EXCISION      There were no vitals filed for this visit.  Subjective Assessment - 10/18/17 1132    Subjective  Reports that he bumped into the countertop with his L hip as he was turning from one counter to put item into refrigerator. Reports greater weakness this year and more dependent on SPC.     Pertinent History  Spinal stenosis; prostate cancer; back surgery and right total knee replacement.    How long can you walk comfortably?  Short community distances with a standing straight cane.    Patient Stated Goals  Walk better andnot fall.    Currently in Pain?  Yes From instability accident this morning    Pain Score  -- No pain score provided     Pain Location  Hip    Pain Orientation  Left    Pain Descriptors / Indicators  Sore    Pain Type  Acute pain    Pain Onset  More than a month ago         [facility]  PT Assessment - 10/18/17 0001      Assessment   Medical Diagnosis  Lumbar spinal stenosis; balance problems.                   [facility] Adult PT Treatment/Exercise - 10/18/17 0001      Exercises   Exercises  Knee/Hip;Shoulder      Knee/Hip Exercises: Aerobic   Nustep  L4 x15 min      Knee/Hip Exercises: Machines for Strengthening   Cybex Knee Extension  10# x20 reps    Cybex Knee Flexion  20# x20 reps      Knee/Hip Exercises: Standing   Heel Raises  Both;20 reps B toe raise x20 reps    Hip Abduction  Stengthening;Both;20 reps;Knee straight;Limitations    Abduction Limitations  yellow theraband    Hip Extension  Stengthening;Both;20 reps;Knee straight;Limitations    Extension Limitations  yellow theraband    Rocker Board  3 minutes          Balance Exercises - 10/18/17 1109      Balance Exercises: Standing   Standing Eyes Opened  Narrow base of support (BOS);Foam/compliant  surface;Time x2 min    Tandem Stance  Eyes open;Intermittent upper extremity support x2 min semitandem for equal weightbearing          PT Short Term Goals - 10/17/17 1234      PT SHORT TERM GOAL #1   Title  Ind with initial HEP.    Time  2    Period  Weeks    Status  New      PT SHORT TERM GOAL #2   Title  Improve Berg score to 33/56.    Time  2    Period  Weeks    Status  New        PT Long Term Goals - 10/17/17 1234      PT LONG TERM GOAL #1   Title  Ind with advanced HEP.    Time  8    Period  Weeks    Status  New      PT LONG TERM GOAL #2   Title  Negative Romberg test.    Time  8    Period  Weeks    Status  New      PT LONG TERM GOAL #3   Title  Improve Berg score to 46/56.            Plan - 10/18/17 1123    Clinical Impression Statement  Patient tolerated today's treatment well although he reports greater weakness and instability overall now. Patient reported turning in his kitchen 180 deg which resulted in bumping into countertop to the L with his L  hip. Core activation added into lat pulldown for general stability with encouragement to maintain breathing. Patient reported light headed but denied sitting down. Patient then guided through machine and standing resisted exercises with no complaints. Patient's greatest weakness in leaning more posteriorly or to L with static stance and general instability with ambulation. Patient guided through low level balance activities with intermittant UE support required with eyes open. Patient more dependent upon [facility].    Rehab Potential  Good    PT Frequency  2x / week    PT Duration  8 weeks    PT Treatment/Interventions  ADLs/Self Care Home Management;Balance training;Therapeutic exercise;Therapeutic activities;Gait training;Neuromuscular re-education;Patient/family education    PT Next Visit Plan  Balance program.  Please include LE and core strengthening.    Consulted and Agree with Plan of Care  Patient       Patient will benefit from skilled therapeutic intervention in order to improve the following deficits and impairments:  Abnormal gait, Decreased activity tolerance, Decreased balance, Decreased coordination, Postural dysfunction, Pain, Difficulty walking  Visit Diagnosis: Chronic bilateral low back pain without sciatica  Unsteadiness on feet     Problem List Patient Active Problem List   Diagnosis Date Noted  . Chronic left shoulder pain 04/20/2016  . Spinal stenosis 09/02/2015  . Numbness 09/02/2015  . Bradycardia 07/01/2014  . Thrombocytopenia ([place]) 06/30/2014  . CAP (community acquired pneumonia) 06/29/2014  . Hypoxia 06/29/2014  . HTN (hypertension) 06/29/2014  . Prostate cancer ([place]) 06/29/2014  . Hyponatremia 06/29/2014    Standley Brooking, PTA 10/18/2017, 11:35 AM  [facility] [address] [place], [place], [address] Phone: [phone]   Fax:  [phone]  Name: Thomas Vega MRN: [id number] Date of Birth:  Jul 28, 1926

## 2017-10-24 ENCOUNTER — Ambulatory Visit (HOSPITAL_COMMUNITY)
Admission: RE | Admit: 2017-10-24 | Discharge: 2017-10-24 | Disposition: A | Discharge status: Home / Self Care | Payer: Medicare Other | Source: Ambulatory Visit | Attending: Urology | Admitting: Urology

## 2017-10-24 DIAGNOSIS — R972 Elevated prostate specific antigen [PSA]: Secondary | ICD-10-CM | POA: Diagnosis not present

## 2017-10-24 DIAGNOSIS — C61 Malignant neoplasm of prostate: Secondary | ICD-10-CM | POA: Diagnosis not present

## 2017-10-24 MED ORDER — AXUMIN (FLUCICLOVINE F 18) INJECTION
10.6600 | Freq: Once | INTRAVENOUS | Status: AC
  Administered 2017-10-24: 10.66 via INTRAVENOUS

## 2017-10-25 ENCOUNTER — Ambulatory Visit: Payer: Medicare Other | Admitting: Physical Therapy

## 2017-10-25 DIAGNOSIS — R2681 Unsteadiness on feet: Secondary | ICD-10-CM | POA: Diagnosis not present

## 2017-10-25 DIAGNOSIS — M545 Low back pain: Secondary | ICD-10-CM | POA: Diagnosis not present

## 2017-10-25 DIAGNOSIS — G8929 Other chronic pain: Secondary | ICD-10-CM | POA: Diagnosis not present

## 2017-10-25 NOTE — Therapy (Signed)
[facility] [place], [place], [address] Phone: [phone]   Fax:  [phone]  Physical Therapy Treatment  Patient Details  Name: Lance Huaracha MRN: [id number] Date of Birth: [birth year] Referring Provider: Basil Dess MD.   Encounter Date: 10/25/2017  PT End of Session - 10/25/17 1307    Visit Number  3    Number of Visits  16    Date for PT Re-Evaluation  01/15/18    PT Start Time  1304    PT Stop Time  1350    PT Time Calculation (min)  46 min    Activity Tolerance  Patient tolerated treatment well    Behavior During Therapy  [facility] for tasks assessed/performed       Past Medical History:  Diagnosis Date  . Abnormal finding of blood chemistry   . Bradycardia 07/01/2014  . CAP (community acquired pneumonia) 06/29/2014  . Heart murmur   . Irregular heart rate   . Melanoma ([place])   . Prostate cancer ([place])   . Thrombocytopenia ([place]) 06/30/2014    Past Surgical History:  Procedure Laterality Date  . BACK SURGERY    . CATARACT EXTRACTION    . KNEE SURGERY    . MELANOMA EXCISION      There were no vitals filed for this visit.  Subjective Assessment - 10/25/17 1308    Subjective  Patient reports hamstrings a little sore from last visit but reports no pain otherwise.    Pertinent History  Spinal stenosis; prostate cancer; back surgery and right total knee replacement.    How long can you walk comfortably?  Short community distances with a standing straight cane.    Patient Stated Goals  Walk better andnot fall.    Currently in Pain?  No/denies         [facility] PT Assessment - 10/25/17 0001      Assessment   Medical Diagnosis  Lumbar spinal stenosis; balance problems.                   [facility] Adult PT Treatment/Exercise - 10/25/17 0001      Exercises   Exercises  Knee/Hip;Shoulder      Knee/Hip Exercises: Aerobic   Nustep  L4 x20 min      Knee/Hip Exercises: Standing   Hip Flexion  --    Rocker  Board  4 minutes      Shoulder Exercises: Standing   Row  Strengthening;Both x2 minutes     Row Limitations  Pink XTS      Shoulder Exercises: Stretch   Corner Stretch  3 reps;30 seconds          Balance Exercises - 10/25/17 1510      Balance Exercises: Standing   Standing Eyes Opened  Narrow base of support (BOS);Foam/compliant surface;Time x2 minutes    SLS with Vectors  Solid surface;Intermittent upper extremity assist;Upper extremity assist 2;Time pod touches x2 minutes (1 min each LE)    Step Over Hurdles / Cones  lateral step over colored pods x2 minutes with bilateral UE support    Marching Limitations  heel taps on 4" step x3 minutes, intermittent UE support          PT Short Term Goals - 10/17/17 1234      PT SHORT TERM GOAL #1   Title  Ind with initial HEP.    Time  2    Period  Weeks    Status  New  PT SHORT TERM GOAL #2   Title  Improve Berg score to 33/56.    Time  2    Period  Weeks    Status  New        PT Long Term Goals - 10/17/17 1234      PT LONG TERM GOAL #1   Title  Ind with advanced HEP.    Time  8    Period  Weeks    Status  New      PT LONG TERM GOAL #2   Title  Negative Romberg test.    Time  8    Period  Weeks    Status  New      PT LONG TERM GOAL #3   Title  Improve Berg score to 46/56.            Plan - 10/25/17 1457    Clinical Impression Statement  Patient was able to tolerate treatment well. Patient required contact guard to min A to prevent loss of balance during single leg pod touches. Patient noted with min-mod sway during NBOS balance on airex.     Clinical Presentation  Evolving    Clinical Decision Making  Moderate    Rehab Potential  Good    PT Frequency  2x / week    PT Duration  8 weeks    PT Treatment/Interventions  ADLs/Self Care Home Management;Balance training;Therapeutic exercise;Therapeutic activities;Gait training;Neuromuscular re-education;Patient/family education    PT Next Visit Plan   Balance program.  Please include LE and core strengthening.    Consulted and Agree with Plan of Care  Patient       Patient will benefit from skilled therapeutic intervention in order to improve the following deficits and impairments:  Abnormal gait, Decreased activity tolerance, Decreased balance, Decreased coordination, Postural dysfunction, Pain, Difficulty walking  Visit Diagnosis: Chronic bilateral low back pain without sciatica  Unsteadiness on feet     Problem List Patient Active Problem List   Diagnosis Date Noted  . Chronic left shoulder pain 04/20/2016  . Spinal stenosis 09/02/2015  . Numbness 09/02/2015  . Bradycardia 07/01/2014  . Thrombocytopenia ([place]) 06/30/2014  . CAP (community acquired pneumonia) 06/29/2014  . Hypoxia 06/29/2014  . HTN (hypertension) 06/29/2014  . Prostate cancer ([place]) 06/29/2014  . Hyponatremia 06/29/2014   Gabriela Eves, PT, DPT 10/25/2017, 3:16 PM  [facility] [address] [place], [place], [address] Phone: [phone]   Fax:  [phone]  Name: Julen Rubert MRN: [id number] Date of Birth: 1926-06-25

## 2017-10-26 ENCOUNTER — Encounter: Payer: Self-pay | Admitting: Physical Therapy

## 2017-10-26 ENCOUNTER — Ambulatory Visit: Payer: Medicare Other | Attending: Specialist | Admitting: Physical Therapy

## 2017-10-26 DIAGNOSIS — R2681 Unsteadiness on feet: Secondary | ICD-10-CM

## 2017-10-26 DIAGNOSIS — M545 Low back pain: Secondary | ICD-10-CM | POA: Diagnosis not present

## 2017-10-26 DIAGNOSIS — G8929 Other chronic pain: Secondary | ICD-10-CM | POA: Insufficient documentation

## 2017-10-26 NOTE — Therapy (Signed)
[facility] [place], [place], [address] Phone: [phone]   Fax:  [phone]  Physical Therapy Treatment  Patient Details  Name: Thomas Vega MRN: [id number] Date of Birth: 29-Dec-1926 Referring Provider: Basil Dess MD.   Encounter Date: 10/26/2017  PT End of Session - 10/26/17 1121    Visit Number  4    Number of Visits  16    Date for PT Re-Evaluation  01/15/18    Authorization Type  Progress note every 10th visit, KX modifier at 15th visit    PT Start Time  1115    PT Stop Time  1203    PT Time Calculation (min)  48 min    Activity Tolerance  Patient tolerated treatment well    Behavior During Therapy  [facility] for tasks assessed/performed       Past Medical History:  Diagnosis Date  . Abnormal finding of blood chemistry   . Bradycardia 07/01/2014  . CAP (community acquired pneumonia) 06/29/2014  . Heart murmur   . Irregular heart rate   . Melanoma ([place])   . Prostate cancer ([place])   . Thrombocytopenia ([place]) 06/30/2014    Past Surgical History:  Procedure Laterality Date  . BACK SURGERY    . CATARACT EXTRACTION    . KNEE SURGERY    . MELANOMA EXCISION      There were no vitals filed for this visit.  Subjective Assessment - 10/26/17 1121    Subjective  Patient reports still having hamstring soreness but otherwise doing "alright."    Pertinent History  Spinal stenosis; prostate cancer; back surgery and right total knee replacement.    How long can you walk comfortably?  Short community distances with a standing straight cane.    Patient Stated Goals  Walk better andnot fall.    Pain Score  6     Pain Location  Leg    Pain Orientation  Right;Posterior    Pain Descriptors / Indicators  Sore    Pain Type  Acute pain    Pain Onset  In the past 7 days         [facility] PT Assessment - 10/26/17 0001      Assessment   Medical Diagnosis  Lumbar spinal stenosis; balance problems.                    [facility] Adult PT Treatment/Exercise - 10/26/17 0001      Knee/Hip Exercises: Aerobic   Nustep  L4 x15 min      Knee/Hip Exercises: Standing   Rocker Board  4 minutes          Balance Exercises - 10/26/17 1305      Balance Exercises: Standing   Standing Eyes Opened  Narrow base of support (BOS);Foam/compliant surface;Time 2 minutes     Standing Eyes Closed  Wide (BOA);Solid surface;4 reps;10 secs    Gait with Head Turns  Forward;Other reps (comment);5 reps;1 rep 6x with head turns and stop/go commands    Sidestepping  Upper extremity support;Other reps (comment) 3 minutes    Numbers 1-15  Static;Foam/compliant surface 2 x2 mins level surface; 2 x2 minutes foam     Heel Raises Limitations  30    Toe Raise Limitations  30    Sit to Stand Time  2x5 no UE support, raised plinth          PT Short Term Goals - 10/17/17 1234      PT SHORT TERM  GOAL #1   Title  Ind with initial HEP.    Time  2    Period  Weeks    Status  New      PT SHORT TERM GOAL #2   Title  Improve Berg score to 33/56.    Time  2    Period  Weeks    Status  New        PT Long Term Goals - 10/17/17 1234      PT LONG TERM GOAL #1   Title  Ind with advanced HEP.    Time  8    Period  Weeks    Status  New      PT LONG TERM GOAL #2   Title  Negative Romberg test.    Time  8    Period  Weeks    Status  New      PT LONG TERM GOAL #3   Title  Improve Berg score to 46/56.            Plan - 10/26/17 1257    Clinical Impression Statement  Patient was able to tolerate treatment well despite reports of hamstring soreness. Patient required min A to prevent loss of balance with WBOS eyes closed balance activity. Patient only able to stand for 4 seconds before increase sway and min A. Patient noted with decreased gait speed with head turns but no loss of balance. Patient educated on importance of keeping head straight instead of down to optimize scanning environment.  Patient reported understanding.    Clinical Presentation  Evolving    Clinical Decision Making  Moderate    Rehab Potential  Good    PT Frequency  2x / week    PT Duration  8 weeks    PT Treatment/Interventions  ADLs/Self Care Home Management;Balance training;Therapeutic exercise;Therapeutic activities;Gait training;Neuromuscular re-education;Patient/family education    PT Next Visit Plan  Balance program.  Please include LE and core strengthening.    Consulted and Agree with Plan of Care  Patient       Patient will benefit from skilled therapeutic intervention in order to improve the following deficits and impairments:  Abnormal gait, Decreased activity tolerance, Decreased balance, Decreased coordination, Postural dysfunction, Pain, Difficulty walking  Visit Diagnosis: Chronic bilateral low back pain without sciatica  Unsteadiness on feet     Problem List Patient Active Problem List   Diagnosis Date Noted  . Chronic left shoulder pain 04/20/2016  . Spinal stenosis 09/02/2015  . Numbness 09/02/2015  . Bradycardia 07/01/2014  . Thrombocytopenia ([place]) 06/30/2014  . CAP (community acquired pneumonia) 06/29/2014  . Hypoxia 06/29/2014  . HTN (hypertension) 06/29/2014  . Prostate cancer ([place]) 06/29/2014  . Hyponatremia 06/29/2014   Gabriela Eves, PT, DPT 10/26/2017, 1:10 PM  [facility] [address] [place], [place], [address] Phone: [phone]   Fax:  [phone]  Name: Andoni Busch MRN: [id number] Date of Birth: 1926-11-01

## 2017-10-30 ENCOUNTER — Encounter (INDEPENDENT_AMBULATORY_CARE_PROVIDER_SITE_OTHER): Payer: Medicare Other | Admitting: Ophthalmology

## 2017-10-30 DIAGNOSIS — I10 Essential (primary) hypertension: Secondary | ICD-10-CM | POA: Diagnosis not present

## 2017-10-30 DIAGNOSIS — H353122 Nonexudative age-related macular degeneration, left eye, intermediate dry stage: Secondary | ICD-10-CM

## 2017-10-30 DIAGNOSIS — H43813 Vitreous degeneration, bilateral: Secondary | ICD-10-CM

## 2017-10-30 DIAGNOSIS — H35033 Hypertensive retinopathy, bilateral: Secondary | ICD-10-CM | POA: Diagnosis not present

## 2017-10-30 DIAGNOSIS — H353211 Exudative age-related macular degeneration, right eye, with active choroidal neovascularization: Secondary | ICD-10-CM | POA: Diagnosis not present

## 2017-10-31 ENCOUNTER — Encounter: Payer: Medicare Other | Admitting: Physical Therapy

## 2017-11-01 ENCOUNTER — Ambulatory Visit: Payer: Medicare Other | Admitting: Physical Therapy

## 2017-11-01 DIAGNOSIS — M545 Low back pain, unspecified: Secondary | ICD-10-CM

## 2017-11-01 DIAGNOSIS — R2681 Unsteadiness on feet: Secondary | ICD-10-CM

## 2017-11-01 DIAGNOSIS — G8929 Other chronic pain: Secondary | ICD-10-CM | POA: Diagnosis not present

## 2017-11-01 NOTE — Therapy (Signed)
[facility] [place], [place], [address] Phone: [phone]   Fax:  [phone]  Physical Therapy Treatment  Patient Details  Name: Thomas Vega MRN: [id number] Date of Birth: 03-22-1927 Referring Provider: Basil Dess MD.   Encounter Date: 11/01/2017  PT End of Session - 11/01/17 1304    Visit Number  5    Number of Visits  16    Date for PT Re-Evaluation  01/15/18    Authorization Type  Progress note every 10th visit, KX modifier at 15th visit    PT Start Time  1303    PT Stop Time  1345    PT Time Calculation (min)  42 min    Activity Tolerance  Patient tolerated treatment well    Behavior During Therapy  [facility] for tasks assessed/performed       Past Medical History:  Diagnosis Date  . Abnormal finding of blood chemistry   . Bradycardia 07/01/2014  . CAP (community acquired pneumonia) 06/29/2014  . Heart murmur   . Irregular heart rate   . Melanoma ([place])   . Prostate cancer ([place])   . Thrombocytopenia ([place]) 06/30/2014    Past Surgical History:  Procedure Laterality Date  . BACK SURGERY    . CATARACT EXTRACTION    . KNEE SURGERY    . MELANOMA EXCISION      There were no vitals filed for this visit.  Subjective Assessment - 11/01/17 1304    Subjective  Patient reports still having right leg soreness and that has been affecting his walking.    Pertinent History  Spinal stenosis; prostate cancer; back surgery and right total knee replacement.    How long can you walk comfortably?  Short community distances with a standing straight cane.    Patient Stated Goals  Walk better andnot fall.    Currently in Pain?  Yes    Pain Score  8     Pain Location  Leg    Pain Orientation  Right;Posterior    Pain Descriptors / Indicators  Sore    Pain Type  Acute pain    Pain Onset  In the past 7 days    Pain Frequency  Constant         [facility] PT Assessment - 11/01/17 0001      Assessment   Medical Diagnosis  Lumbar  spinal stenosis; balance problems.                   [facility] Adult PT Treatment/Exercise - 11/01/17 0001      Exercises   Exercises  Lumbar      Lumbar Exercises: Stretches   Passive Hamstring Stretch  Right;2 reps;20 seconds      Lumbar Exercises: Supine   Ab Set  20 reps;5 seconds    Clam  20 reps;3 seconds    Bridge  Compliant;10 reps    Other Supine Lumbar Exercises  hip adduction ball squeeze 5" hold x20      Knee/Hip Exercises: Aerobic   Nustep  Level 2 x10 minutes          Balance Exercises - 11/01/17 1331      Balance Exercises: Standing   Rockerboard  Other time (comment);Anterior/posterior 3 minutes    Sidestepping  Upper extremity support;Other reps (comment) 2 minutes    Marching Limitations  heel taps on 4" step x3 minutes, intermittent UE support    Heel Raises Limitations  30  PT Short Term Goals - 10/17/17 1234      PT SHORT TERM GOAL #1   Title  Ind with initial HEP.    Time  2    Period  Weeks    Status  New      PT SHORT TERM GOAL #2   Title  Improve Berg score to 33/56.    Time  2    Period  Weeks    Status  New        PT Long Term Goals - 10/17/17 1234      PT LONG TERM GOAL #1   Title  Ind with advanced HEP.    Time  8    Period  Weeks    Status  New      PT LONG TERM GOAL #2   Title  Negative Romberg test.    Time  8    Period  Weeks    Status  New      PT LONG TERM GOAL #3   Title  Improve Berg score to 46/56.            Plan - 11/01/17 1448    Clinical Impression Statement  Patient was able to tolerate treatment well with minimal reports of pain with supine exercies. Patient educated to perform hamstring stretch both in supine and standing to help alleviate hamstring pain. Patient was able to demonstrate good balance with CG assist. Patient reported decrease of hamstring and leg pain to 6/10 from 8/10 at end of session.     Clinical Presentation  Evolving    Clinical Decision Making  Moderate     Rehab Potential  Good    PT Frequency  2x / week    PT Duration  8 weeks    PT Treatment/Interventions  ADLs/Self Care Home Management;Balance training;Therapeutic exercise;Therapeutic activities;Gait training;Neuromuscular re-education;Patient/family education    PT Next Visit Plan  Balance program.  Please include LE and core strengthening.    Consulted and Agree with Plan of Care  Patient       Patient will benefit from skilled therapeutic intervention in order to improve the following deficits and impairments:  Abnormal gait, Decreased activity tolerance, Decreased balance, Decreased coordination, Postural dysfunction, Pain, Difficulty walking  Visit Diagnosis: Chronic bilateral low back pain without sciatica  Unsteadiness on feet     Problem List Patient Active Problem List   Diagnosis Date Noted  . Chronic left shoulder pain 04/20/2016  . Spinal stenosis 09/02/2015  . Numbness 09/02/2015  . Bradycardia 07/01/2014  . Thrombocytopenia ([place]) 06/30/2014  . CAP (community acquired pneumonia) 06/29/2014  . Hypoxia 06/29/2014  . HTN (hypertension) 06/29/2014  . Prostate cancer ([place]) 06/29/2014  . Hyponatremia 06/29/2014   Gabriela Eves, PT, DPT 11/01/2017, 2:51 PM  [facility] [address] [place], [place], [address] Phone: [phone]   Fax:  [phone]  Name: Amjad Fikes MRN: [id number] Date of Birth: 05/19/1926

## 2017-11-07 ENCOUNTER — Ambulatory Visit: Payer: Medicare Other | Admitting: Physical Therapy

## 2017-11-07 ENCOUNTER — Encounter: Payer: Self-pay | Admitting: Physical Therapy

## 2017-11-07 DIAGNOSIS — R2681 Unsteadiness on feet: Secondary | ICD-10-CM | POA: Diagnosis not present

## 2017-11-07 DIAGNOSIS — G8929 Other chronic pain: Secondary | ICD-10-CM | POA: Diagnosis not present

## 2017-11-07 DIAGNOSIS — M545 Low back pain: Principal | ICD-10-CM

## 2017-11-07 NOTE — Therapy (Signed)
[facility] [place], [place], [address] Phone: [phone]   Fax:  [phone]  Physical Therapy Treatment  Patient Details  Name: Thomas Vega MRN: [id number] Date of Birth: 14-Feb-1927 Referring Provider: Basil Dess MD.   Encounter Date: 11/07/2017  PT End of Session - 11/07/17 1236    Visit Number  6    Number of Visits  16    Date for PT Re-Evaluation  01/15/18    Authorization Type  Progress note every 10th visit, KX modifier at 15th visit    PT Start Time  1115    PT Stop Time  1159    PT Time Calculation (min)  44 min    Activity Tolerance  Patient tolerated treatment well    Behavior During Therapy  [facility] for tasks assessed/performed       Past Medical History:  Diagnosis Date  . Abnormal finding of blood chemistry   . Bradycardia 07/01/2014  . CAP (community acquired pneumonia) 06/29/2014  . Heart murmur   . Irregular heart rate   . Melanoma ([place])   . Prostate cancer ([place])   . Thrombocytopenia ([place]) 06/30/2014    Past Surgical History:  Procedure Laterality Date  . BACK SURGERY    . CATARACT EXTRACTION    . KNEE SURGERY    . MELANOMA EXCISION      There were no vitals filed for this visit.  Subjective Assessment - 11/07/17 1231    Subjective  Patient reports still having symptoms down R LE. Requested not to perform Nustep today.     Pertinent History  Spinal stenosis; prostate cancer; back surgery and right total knee replacement.    How long can you walk comfortably?  Short community distances with a standing straight cane.    Patient Stated Goals  Walk better andnot fall.    Currently in Pain?  Yes   did not provide pain scale   Pain Location  Leg    Pain Orientation  Right;Posterior    Pain Descriptors / Indicators  Tingling;Sore    Pain Type  Acute pain    Pain Onset  1 to 4 weeks ago         [facility] PT Assessment - 11/07/17 0001      Assessment   Medical Diagnosis  Lumbar spinal  stenosis; balance problems.                   [place] Adult PT Treatment/Exercise - 11/07/17 0001      Exercises   Exercises  Lumbar      Lumbar Exercises: Stretches   Passive Hamstring Stretch  Right;2 reps;30 seconds    Single Knee to Chest Stretch  3 reps;30 seconds;Right;Left    Double Knee to Chest Stretch  3 reps;30 seconds      Lumbar Exercises: Supine   Ab Set  20 reps;5 seconds    Clam  20 reps;3 seconds    Bridge  Compliant;20 reps   2x10   Other Supine Lumbar Exercises  hip adduction ball squeeze 5" hold x20          Balance Exercises - 11/07/17 1227      Balance Exercises: Standing   Rockerboard  Anterior/posterior;Lateral;Intermittent UE support   AP and lateral x3 minutes each   Marching Limitations  marching with 2 UE support x2 minutes    Other Standing Exercises  hip abduction x2 minutes          PT Short Term  Goals - 10/17/17 1234      PT SHORT TERM GOAL #1   Title  Ind with initial HEP.    Time  2    Period  Weeks    Status  New      PT SHORT TERM GOAL #2   Title  Improve Berg score to 33/56.    Time  2    Period  Weeks    Status  New        PT Long Term Goals - 10/17/17 1234      PT LONG TERM GOAL #1   Title  Ind with advanced HEP.    Time  8    Period  Weeks    Status  New      PT LONG TERM GOAL #2   Title  Negative Romberg test.    Time  8    Period  Weeks    Status  New      PT LONG TERM GOAL #3   Title  Improve Berg score to 46/56.            Plan - 11/07/17 1232    Clinical Impression Statement  Exercises were emphasized on supine stretching and strengthening and balance per patient request. Patient was able to tolerate treatement well with no reports of increased pain. Patient noted with decreased nerve irritation with ab set. Patient was able to complete balance exercises with close supervision and no loss of balance. Patient provided with HEP of low level strengthening and stretching as patient will  not be able to come another visit until next week. Patient reported understanding with all exercises.     Clinical Presentation  Evolving    Clinical Decision Making  Moderate    Rehab Potential  Good    PT Frequency  2x / week    PT Duration  8 weeks    PT Next Visit Plan  Balance program.  Please include LE and core strengthening.    Consulted and Agree with Plan of Care  Patient       Patient will benefit from skilled therapeutic intervention in order to improve the following deficits and impairments:  Abnormal gait, Decreased activity tolerance, Decreased balance, Decreased coordination, Postural dysfunction, Pain, Difficulty walking  Visit Diagnosis: Chronic bilateral low back pain without sciatica  Unsteadiness on feet     Problem List Patient Active Problem List   Diagnosis Date Noted  . Chronic left shoulder pain 04/20/2016  . Spinal stenosis 09/02/2015  . Numbness 09/02/2015  . Bradycardia 07/01/2014  . Thrombocytopenia ([place]) 06/30/2014  . CAP (community acquired pneumonia) 06/29/2014  . Hypoxia 06/29/2014  . HTN (hypertension) 06/29/2014  . Prostate cancer ([place]) 06/29/2014  . Hyponatremia 06/29/2014   Gabriela Eves, PT, DPT 11/07/2017, 12:37 PM  [facility] [address] [place], [place], [address] Phone: [phone]   Fax:  [phone]  Name: Thomas Vega MRN: [id number] Date of Birth: [birth year]

## 2017-11-09 DIAGNOSIS — C61 Malignant neoplasm of prostate: Secondary | ICD-10-CM | POA: Diagnosis not present

## 2017-11-09 DIAGNOSIS — R9721 Rising PSA following treatment for malignant neoplasm of prostate: Secondary | ICD-10-CM | POA: Diagnosis not present

## 2017-11-13 ENCOUNTER — Encounter (INDEPENDENT_AMBULATORY_CARE_PROVIDER_SITE_OTHER): Payer: Self-pay | Admitting: Physical Medicine and Rehabilitation

## 2017-11-13 ENCOUNTER — Ambulatory Visit (INDEPENDENT_AMBULATORY_CARE_PROVIDER_SITE_OTHER): Payer: Medicare Other | Admitting: Physical Medicine and Rehabilitation

## 2017-11-13 ENCOUNTER — Encounter: Payer: Medicare Other | Admitting: Physical Therapy

## 2017-11-13 ENCOUNTER — Ambulatory Visit (INDEPENDENT_AMBULATORY_CARE_PROVIDER_SITE_OTHER): Payer: Self-pay

## 2017-11-13 VITALS — BP 98/53 | HR 58

## 2017-11-13 DIAGNOSIS — M5416 Radiculopathy, lumbar region: Secondary | ICD-10-CM | POA: Diagnosis not present

## 2017-11-13 DIAGNOSIS — M961 Postlaminectomy syndrome, not elsewhere classified: Secondary | ICD-10-CM

## 2017-11-13 DIAGNOSIS — M48062 Spinal stenosis, lumbar region with neurogenic claudication: Secondary | ICD-10-CM

## 2017-11-13 MED ORDER — BETAMETHASONE SOD PHOS & ACET 6 (3-3) MG/ML IJ SUSP
12.0000 mg | Freq: Once | INTRAMUSCULAR | Status: AC
  Administered 2017-11-13: 12 mg

## 2017-11-13 NOTE — Progress Notes (Signed)
    Numeric Pain Rating Scale and Functional Assessment Average Pain (7)   In the last MONTH (on 0-10 scale) has pain interfered with the following?  1. General activity like being  able to carry out your everyday physical activities such as walking, climbing stairs, carrying groceries, or moving a chair?  Rating(10)   +Driver, -BT, -Dye Allergies.   

## 2017-11-13 NOTE — Patient Instructions (Signed)

## 2017-11-15 ENCOUNTER — Encounter: Payer: Medicare Other | Admitting: Physical Therapy

## 2017-11-21 ENCOUNTER — Encounter: Payer: Medicare Other | Admitting: Physical Therapy

## 2017-11-21 DIAGNOSIS — D1801 Hemangioma of skin and subcutaneous tissue: Secondary | ICD-10-CM | POA: Diagnosis not present

## 2017-11-21 DIAGNOSIS — D229 Melanocytic nevi, unspecified: Secondary | ICD-10-CM | POA: Diagnosis not present

## 2017-11-21 DIAGNOSIS — Z85820 Personal history of malignant melanoma of skin: Secondary | ICD-10-CM | POA: Diagnosis not present

## 2017-11-21 DIAGNOSIS — L814 Other melanin hyperpigmentation: Secondary | ICD-10-CM | POA: Diagnosis not present

## 2017-11-21 DIAGNOSIS — L821 Other seborrheic keratosis: Secondary | ICD-10-CM | POA: Diagnosis not present

## 2017-11-21 DIAGNOSIS — L578 Other skin changes due to chronic exposure to nonionizing radiation: Secondary | ICD-10-CM | POA: Diagnosis not present

## 2017-11-21 NOTE — Procedures (Signed)
Lumbosacral Transforaminal Epidural Steroid Injection - Sub-Pedicular Approach with Fluoroscopic Guidance  Patient: Thomas Vega      Date of Birth: 22-Aug-1926 MRN: [id number] PCP: Jani Gravel, MD      Visit Date: 11/13/2017   Universal Protocol:    Date/Time: 11/13/2017  Consent Given By: the patient  Position: PRONE  Additional Comments: Vital signs were monitored before and after the procedure. Patient was prepped and draped in the usual sterile fashion. The correct patient, procedure, and site was verified.   Injection Procedure Details:  Procedure Site One Meds Administered:  Meds ordered this encounter  Medications  . betamethasone acetate-betamethasone sodium phosphate (CELESTONE) injection 12 mg    Laterality: Right  Location/Site:  L4-L5  Needle size: 22 G  Needle type: Spinal  Needle Placement: Transforaminal  Findings:    -Comments: Excellent flow of contrast along the nerve and into the epidural space.  Procedure Details: After squaring off the end-plates to get a true AP view, the C-arm was positioned so that an oblique view of the foramen as noted above was visualized. The target area is just inferior to the "nose of the scotty dog" or sub pedicular. The soft tissues overlying this structure were infiltrated with 2-3 ml. of 1% Lidocaine without Epinephrine.  The spinal needle was inserted toward the target using a "trajectory" view along the fluoroscope beam.  Under AP and lateral visualization, the needle was advanced so it did not puncture dura and was located close the 6 O'Clock position of the pedical in AP tracterory. Biplanar projections were used to confirm position. Aspiration was confirmed to be negative for CSF and/or blood. A 1-2 ml. volume of Isovue-250 was injected and flow of contrast was noted at each level. Radiographs were obtained for documentation purposes.   After attaining the desired flow of contrast documented above, a 0.5 to 1.0  ml test dose of 0.25% Marcaine was injected into each respective transforaminal space.  The patient was observed for 90 seconds post injection.  After no sensory deficits were reported, and normal lower extremity motor function was noted,   the above injectate was administered so that equal amounts of the injectate were placed at each foramen (level) into the transforaminal epidural space.   Additional Comments:  The patient tolerated the procedure well Dressing: Band-Aid    Post-procedure details: Patient was observed during the procedure. Post-procedure instructions were reviewed.  Patient left the clinic in stable condition.

## 2017-11-21 NOTE — Progress Notes (Signed)
Thomas Vega - 84 y.o. male MRN [id number]  Date of birth: 29-Nov-1926  Office Visit Note: Visit Date: 11/13/2017 PCP: Jani Gravel, MD Referred by: Jani Gravel, MD  Subjective: Chief Complaint  Patient presents with  . Lower Back - Pain   HPI: Mr. Soulliere is an 84 year old gentleman with multilevel severe stenosis.  In the past L5-S1 interlaminar injections have given him decent relief.  I recently saw him when he was here with his wife who I just saw right for him today.  He still complaining of right radicular pain more of an L5 and S1 distribution.  Prior epidural injection from an interlaminar approach was not as successful as in the past.  He has had prior laminectomy at this level.  I think is best to try a right L4 transforaminal injection to see if he gets some relief with that.  We will see him in follow-up if we need to.   ROS Otherwise per HPI.  Assessment & Plan: Visit Diagnoses:  1. Lumbar radiculopathy   2. Spinal stenosis of lumbar region with neurogenic claudication   3. Post laminectomy syndrome     Plan: No additional findings.   Meds & Orders:  Meds ordered this encounter  Medications  . betamethasone acetate-betamethasone sodium phosphate (CELESTONE) injection 12 mg    Orders Placed This Encounter  Procedures  . XR C-ARM NO REPORT  . Epidural Steroid injection    Follow-up: Return if symptoms worsen or fail to improve.   Procedures: No procedures performed  Lumbosacral Transforaminal Epidural Steroid Injection - Sub-Pedicular Approach with Fluoroscopic Guidance  Patient: Jacorie Ernsberger      Date of Birth: November 23, 1926 MRN: [id number] PCP: Jani Gravel, MD      Visit Date: 11/13/2017   Universal Protocol:    Date/Time: 11/13/2017  Consent Given By: the patient  Position: PRONE  Additional Comments: Vital signs were monitored before and after the procedure. Patient was prepped and draped in the usual sterile fashion. The correct patient,  procedure, and site was verified.   Injection Procedure Details:  Procedure Site One Meds Administered:  Meds ordered this encounter  Medications  . betamethasone acetate-betamethasone sodium phosphate (CELESTONE) injection 12 mg    Laterality: Right  Location/Site:  L4-L5  Needle size: 22 G  Needle type: Spinal  Needle Placement: Transforaminal  Findings:    -Comments: Excellent flow of contrast along the nerve and into the epidural space.  Procedure Details: After squaring off the end-plates to get a true AP view, the C-arm was positioned so that an oblique view of the foramen as noted above was visualized. The target area is just inferior to the "nose of the scotty dog" or sub pedicular. The soft tissues overlying this structure were infiltrated with 2-3 ml. of 1% Lidocaine without Epinephrine.  The spinal needle was inserted toward the target using a "trajectory" view along the fluoroscope beam.  Under AP and lateral visualization, the needle was advanced so it did not puncture dura and was located close the 6 O'Clock position of the pedical in AP tracterory. Biplanar projections were used to confirm position. Aspiration was confirmed to be negative for CSF and/or blood. A 1-2 ml. volume of Isovue-250 was injected and flow of contrast was noted at each level. Radiographs were obtained for documentation purposes.   After attaining the desired flow of contrast documented above, a 0.5 to 1.0 ml test dose of 0.25% Marcaine was injected into each respective transforaminal space.  The patient was  observed for 90 seconds post injection.  After no sensory deficits were reported, and normal lower extremity motor function was noted,   the above injectate was administered so that equal amounts of the injectate were placed at each foramen (level) into the transforaminal epidural space.   Additional Comments:  The patient tolerated the procedure well Dressing: Band-Aid    Post-procedure  details: Patient was observed during the procedure. Post-procedure instructions were reviewed.  Patient left the clinic in stable condition.     Clinical History: MRI LUMBAR SPINE WITHOUT CONTRAST  TECHNIQUE: Multiplanar, multisequence MR imaging of the lumbar spine was performed. No intravenous contrast was administered.  COMPARISON:  10/27/2011  FINDINGS: Segmentation: Conventional anatomy assistant, with the last open disc space designated L5-S1.  Alignment: The vertebral bodies of the lumbar spine are normal in alignment.  Bones: The vertebral bodies of the lumbar spine are normal in size. There is normal bone marrow signal demonstrated throughout the vertebra. The visualized portions of the SI joints are unremarkable.  Conus medullaris: Extends to the T12-L1 level and appears normal. The nerve roots of the cauda equina and the filum terminale are normal.  Paraspinal and other soft tissues: There is no focal abnormality. The imaged intra-abdominal contents are unremarkable.  Disc levels:  Disc spaces: There is mild degenerative disc disease with disc height loss at L5-S1.  T12-L1: No significant disc bulge. No evidence of neural foraminal stenosis. No central canal stenosis.  L1-L2: Mild broad-based disc bulge. No evidence of neural foraminal stenosis. No central canal stenosis.  L2-L3: Mild broad-based disc bulge. Severe bilateral facet arthropathy. Severe spinal stenosis. No evidence of neural foraminal stenosis.  L3-L4: Mild broad-based disc bulge. Severe bilateral facet arthropathy and severe spinal stenosis. No evidence of neural foraminal stenosis.  L4-L5: Mild broad-based disc bulge. Severe bilateral facet arthropathy and severe spinal stenosis. No evidence of neural foraminal stenosis.  L5-S1: Mild broad-based disc bulge with a tiny right paracentral disc protrusion. No evidence of neural foraminal stenosis. No central canal  stenosis.  IMPRESSION: 1. At L2-3 and L3-4 there are mild broad-based disc bulges with severe bilateral facet arthropathy and severe spinal stenosis. 2. At L4-5 there is a mild broad-based disc bulge with severe bilateral facet arthropathy and severe spinal stenosis.   Electronically Signed   By: Kathreen Devoid   On: 07/02/2015 15:18   He reports that he has never smoked. He has never used smokeless tobacco. No results for input(s): HGBA1C, LABURIC in the last 8760 hours.  Objective:  VS:  HT:    WT:   BMI:     BP:(!) 98/53  HR:(!) 58bpm  TEMP: ( )  RESP:95 % Physical Exam  Ortho Exam Imaging: No results found.  Past Medical/Family/Surgical/Social History: Medications & Allergies reviewed per EMR, new medications updated. Patient Active Problem List   Diagnosis Date Noted  . Chronic left shoulder pain 04/20/2016  . Spinal stenosis 09/02/2015  . Numbness 09/02/2015  . Bradycardia 07/01/2014  . Thrombocytopenia ([place]) 06/30/2014  . CAP (community acquired pneumonia) 06/29/2014  . Hypoxia 06/29/2014  . HTN (hypertension) 06/29/2014  . Prostate cancer ([place]) 06/29/2014  . Hyponatremia 06/29/2014   Past Medical History:  Diagnosis Date  . Abnormal finding of blood chemistry   . Bradycardia 07/01/2014  . CAP (community acquired pneumonia) 06/29/2014  . Heart murmur   . Irregular heart rate   . Melanoma ([place])   . Prostate cancer ([place])   . Thrombocytopenia ([place]) 06/30/2014   Family History  Problem Relation Age of Onset  . Heart Problems Mother   . Stroke Father    Past Surgical History:  Procedure Laterality Date  . BACK SURGERY    . CATARACT EXTRACTION    . KNEE SURGERY    . MELANOMA EXCISION     Social History   Occupational History  . Occupation: Retired   Tobacco Use  . Smoking status: Never Smoker  . Smokeless tobacco: Never Used  Substance and Sexual Activity  . Alcohol use: No    Alcohol/week: 0.0 standard drinks  . Drug use: No  . Sexual activity:  Not on file

## 2017-11-22 ENCOUNTER — Encounter: Payer: Medicare Other | Admitting: Physical Therapy

## 2017-11-23 ENCOUNTER — Ambulatory Visit (INDEPENDENT_AMBULATORY_CARE_PROVIDER_SITE_OTHER): Payer: Medicare Other | Admitting: Specialist

## 2017-11-28 ENCOUNTER — Encounter: Payer: Medicare Other | Admitting: Physical Therapy

## 2017-11-29 ENCOUNTER — Encounter: Payer: Medicare Other | Admitting: Physical Therapy

## 2017-12-04 ENCOUNTER — Encounter (INDEPENDENT_AMBULATORY_CARE_PROVIDER_SITE_OTHER): Payer: Medicare Other | Admitting: Ophthalmology

## 2017-12-04 DIAGNOSIS — H353122 Nonexudative age-related macular degeneration, left eye, intermediate dry stage: Secondary | ICD-10-CM

## 2017-12-04 DIAGNOSIS — H353211 Exudative age-related macular degeneration, right eye, with active choroidal neovascularization: Secondary | ICD-10-CM | POA: Diagnosis not present

## 2017-12-04 DIAGNOSIS — I10 Essential (primary) hypertension: Secondary | ICD-10-CM

## 2017-12-04 DIAGNOSIS — H35033 Hypertensive retinopathy, bilateral: Secondary | ICD-10-CM | POA: Diagnosis not present

## 2017-12-04 DIAGNOSIS — H43813 Vitreous degeneration, bilateral: Secondary | ICD-10-CM | POA: Diagnosis not present

## 2017-12-11 ENCOUNTER — Encounter: Payer: Self-pay | Admitting: Physical Therapy

## 2017-12-11 ENCOUNTER — Ambulatory Visit: Payer: Medicare Other | Attending: Specialist | Admitting: Physical Therapy

## 2017-12-11 DIAGNOSIS — R2681 Unsteadiness on feet: Secondary | ICD-10-CM | POA: Insufficient documentation

## 2017-12-11 DIAGNOSIS — M545 Low back pain: Secondary | ICD-10-CM | POA: Diagnosis not present

## 2017-12-11 DIAGNOSIS — G8929 Other chronic pain: Secondary | ICD-10-CM | POA: Diagnosis not present

## 2017-12-11 NOTE — Therapy (Signed)
[facility] [place], [place], [address] Phone: [phone]   Fax:  [phone]  Physical Therapy Treatment  Patient Details  Name: Thomas Vega MRN: [id number] Date of Birth: [birth year] Referring Provider: Basil Dess MD.   Encounter Date: 12/11/2017  PT End of Session - 12/11/17 1305    Visit Number  7    Number of Visits  16    Date for PT Re-Evaluation  01/15/18    Authorization Type  Progress note every 10th visit, KX modifier at 15th visit    PT Start Time  1300    PT Stop Time  1344    PT Time Calculation (min)  44 min    Activity Tolerance  Patient tolerated treatment well    Behavior During Therapy  [facility] for tasks assessed/performed       Past Medical History:  Diagnosis Date  . Abnormal finding of blood chemistry   . Bradycardia 07/01/2014  . CAP (community acquired pneumonia) 06/29/2014  . Heart murmur   . Irregular heart rate   . Melanoma ([place])   . Prostate cancer ([place])   . Thrombocytopenia ([place]) 06/30/2014    Past Surgical History:  Procedure Laterality Date  . BACK SURGERY    . CATARACT EXTRACTION    . KNEE SURGERY    . MELANOMA EXCISION      There were no vitals filed for this visit.      [facility] PT Assessment - 12/11/17 0001      Assessment   Medical Diagnosis  Lumbar spinal stenosis; balance problems.                   [facility] Adult PT Treatment/Exercise - 12/11/17 0001      Exercises   Exercises  Lumbar;Knee/Hip      Lumbar Exercises: Stretches   Figure 4 Stretch  3 reps;Seated   x15 seconds     Lumbar Exercises: Aerobic   Nustep  Level 4 x12 minutes      Lumbar Exercises: Seated   Sit to Stand  20 reps    Other Seated Lumbar Exercises  scapular retractions red theraband x20 followed by horizontal abduction red theraband x20      Knee/Hip Exercises: Seated   Hamstring Curl  Strengthening;Both;2 sets;10 reps    Hamstring Limitations  Red theraband           Balance Exercises - 12/11/17 1402      Balance Exercises: Standing   Standing Eyes Opened  Narrow base of support (BOS);Wide (BOA);Foam/compliant surface;1 rep;Time   1 minute each   Tandem Stance  Eyes open;Foam/compliant surface;Upper extremity support 1;2 reps;Time;1 rep   1 mintue   Rockerboard  Anterior/posterior;Lateral;Intermittent UE support;Other time (comment)   3 minutes each, 6 total   Marching Limitations  marching on 4" step with 1 UE support    Heel Raises Limitations  2x10    Sit to Stand Time  2x10 UE on thighs          PT Short Term Goals - 10/17/17 1234      PT SHORT TERM GOAL #1   Title  Ind with initial HEP.    Time  2    Period  Weeks    Status  New      PT SHORT TERM GOAL #2   Title  Improve Berg score to 33/56.    Time  2    Period  Weeks    Status  New  PT Long Term Goals - 10/17/17 1234      PT LONG TERM GOAL #1   Title  Ind with advanced HEP.    Time  8    Period  Weeks    Status  New      PT LONG TERM GOAL #2   Title  Negative Romberg test.    Time  8    Period  Weeks    Status  New      PT LONG TERM GOAL #3   Title  Improve Berg score to 46/56.            Plan - 12/11/17 1357    Clinical Impression Statement  Patient was able to tolerate treatment well with minimal reports of fatigue. Patient  required intermittent rest breaks secondary to fatigue. Patient required 1x contact guard assist to prevent loss of balance during tandem stance.     Clinical Presentation  Evolving    Clinical Decision Making  Moderate    Rehab Potential  Good    PT Frequency  2x / week    PT Duration  8 weeks    PT Treatment/Interventions  ADLs/Self Care Home Management;Balance training;Therapeutic exercise;Therapeutic activities;Gait training;Neuromuscular re-education;Patient/family education    PT Next Visit Plan  Assess goals. Continue balance, LE strengthening and core strengthening    Consulted and Agree with Plan of Care   Patient       Patient will benefit from skilled therapeutic intervention in order to improve the following deficits and impairments:  Abnormal gait, Decreased activity tolerance, Decreased balance, Decreased coordination, Postural dysfunction, Pain, Difficulty walking  Visit Diagnosis: Chronic bilateral low back pain without sciatica  Unsteadiness on feet     Problem List Patient Active Problem List   Diagnosis Date Noted  . Chronic left shoulder pain 04/20/2016  . Spinal stenosis 09/02/2015  . Numbness 09/02/2015  . Bradycardia 07/01/2014  . Thrombocytopenia ([place]) 06/30/2014  . CAP (community acquired pneumonia) 06/29/2014  . Hypoxia 06/29/2014  . HTN (hypertension) 06/29/2014  . Prostate cancer ([place]) 06/29/2014  . Hyponatremia 06/29/2014   Gabriela Eves, PT, DPT 12/11/2017, 6:31 PM  [facility] [address] [place], [place], [address] Phone: [phone]   Fax:  [phone]  Name: Thomas Vega MRN: [id number] Date of Birth: Dec 13, 1926

## 2017-12-14 ENCOUNTER — Ambulatory Visit: Payer: Medicare Other | Admitting: *Deleted

## 2017-12-14 DIAGNOSIS — M545 Low back pain: Principal | ICD-10-CM

## 2017-12-14 DIAGNOSIS — R2681 Unsteadiness on feet: Secondary | ICD-10-CM

## 2017-12-14 DIAGNOSIS — G8929 Other chronic pain: Secondary | ICD-10-CM

## 2017-12-14 NOTE — Therapy (Signed)
[facility] [place], [place], [address] Phone: [phone]   Fax:  [phone]  Physical Therapy Treatment  Patient Details  Name: Thomas Vega MRN: [id number] Date of Birth: 10/19/1926 Referring Provider: Basil Dess MD.   Encounter Date: 12/14/2017  PT End of Session - 12/14/17 1305    Visit Number  8    Number of Visits  16    Date for PT Re-Evaluation  01/15/18    Authorization Type  Progress note every 10th visit, KX modifier at 15th visit    PT Start Time  1302    PT Stop Time  1353    PT Time Calculation (min)  51 min       Past Medical History:  Diagnosis Date  . Abnormal finding of blood chemistry   . Bradycardia 07/01/2014  . CAP (community acquired pneumonia) 06/29/2014  . Heart murmur   . Irregular heart rate   . Melanoma ([place])   . Prostate cancer ([place])   . Thrombocytopenia ([place]) 06/30/2014    Past Surgical History:  Procedure Laterality Date  . BACK SURGERY    . CATARACT EXTRACTION    . KNEE SURGERY    . MELANOMA EXCISION      There were no vitals filed for this visit.  Subjective Assessment - 12/14/17 1306    Subjective  Pt reports doing ok today, but still with RT LE symptoms    Pertinent History  Spinal stenosis; prostate cancer; back surgery and right total knee replacement.    How long can you walk comfortably?  Short community distances with a standing straight cane.    Patient Stated Goals  Walk better andnot fall.    Currently in Pain?  Yes    Pain Score  7     Pain Location  Leg    Pain Orientation  Right    Pain Descriptors / Indicators  Tingling;Sore    Pain Onset  1 to 4 weeks ago    Pain Frequency  Constant                       [facility] Adult PT Treatment/Exercise - 12/14/17 0001      Exercises   Exercises  Lumbar;Knee/Hip      Lumbar Exercises: Stretches   Figure 4 Stretch  3 reps;Seated   x15 seconds Supine at home     Lumbar Exercises: Aerobic   Nustep  Level 4 x14 minutes 1500 steps      Lumbar Exercises: Seated   Sit to Stand  20 reps   cues to lean forward for balance.  Without hands   Other Seated Lumbar Exercises  scapular retractions red theraband 2 x20          Balance Exercises - 12/14/17 1330      Balance Exercises: Standing   Standing Eyes Opened  Narrow base of support (BOS);Wide (BOA);Foam/compliant surface;1 rep;Time   1 minute each   Tandem Stance  Eyes open;Foam/compliant surface;Upper extremity support 1;2 reps;Time;1 rep   1 mintue   Rockerboard  Anterior/posterior;Lateral;Intermittent UE support;Other time (comment)   3 minutes each, 6 total   Marching Limitations  marching on 4" step with 1 UE support    Heel Raises Limitations  2x10          PT Short Term Goals - 10/17/17 1234      PT SHORT TERM GOAL #1   Title  Ind with initial HEP.  Time  2    Period  Weeks    Status  New      PT SHORT TERM GOAL #2   Title  Improve Berg score to 33/56.    Time  2    Period  Weeks    Status  New        PT Long Term Goals - 10/17/17 1234      PT LONG TERM GOAL #1   Title  Ind with advanced HEP.    Time  8    Period  Weeks    Status  New      PT LONG TERM GOAL #2   Title  Negative Romberg test.    Time  8    Period  Weeks    Status  New      PT LONG TERM GOAL #3   Title  Improve Berg score to 46/56.            Plan - 12/14/17 1353    Clinical Impression Statement  Pt arrived today doing fair with some RT LE symptoms, but had decreased symptoms after Rx. He was able to complete all therex and balance act's with mainly fatigue. Pt needs SBA or CGA with all balance exs. He was able to perform sit to stand without using the back of his knees against the mat after cues.    Clinical Presentation  Evolving    Rehab Potential  Good    PT Frequency  2x / week    PT Duration  8 weeks    PT Treatment/Interventions  ADLs/Self Care Home Management;Balance training;Therapeutic  exercise;Therapeutic activities;Gait training;Neuromuscular re-education;Patient/family education    PT Next Visit Plan  Assess goals. Continue balance, LE strengthening and core strengthening    Consulted and Agree with Plan of Care  Patient       Patient will benefit from skilled therapeutic intervention in order to improve the following deficits and impairments:  Abnormal gait, Decreased activity tolerance, Decreased balance, Decreased coordination, Postural dysfunction, Pain, Difficulty walking  Visit Diagnosis: Chronic bilateral low back pain without sciatica  Unsteadiness on feet     Problem List Patient Active Problem List   Diagnosis Date Noted  . Chronic left shoulder pain 04/20/2016  . Spinal stenosis 09/02/2015  . Numbness 09/02/2015  . Bradycardia 07/01/2014  . Thrombocytopenia ([place]) 06/30/2014  . CAP (community acquired pneumonia) 06/29/2014  . Hypoxia 06/29/2014  . HTN (hypertension) 06/29/2014  . Prostate cancer ([place]) 06/29/2014  . Hyponatremia 06/29/2014    Thomas Vega,CHRIS, PTA 12/14/2017, 1:59 PM  [facility] [place], [place], [address] Phone: [phone]   Fax:  [phone]  Name: Thomas Vega MRN: [id number] Date of Birth: 1926-08-25

## 2017-12-19 ENCOUNTER — Encounter: Payer: Medicare Other | Admitting: Physical Therapy

## 2017-12-21 ENCOUNTER — Ambulatory Visit: Payer: Medicare Other | Admitting: Physical Therapy

## 2017-12-21 ENCOUNTER — Encounter: Payer: Self-pay | Admitting: Physical Therapy

## 2017-12-21 DIAGNOSIS — G8929 Other chronic pain: Secondary | ICD-10-CM | POA: Diagnosis not present

## 2017-12-21 DIAGNOSIS — R2681 Unsteadiness on feet: Secondary | ICD-10-CM

## 2017-12-21 DIAGNOSIS — M545 Low back pain, unspecified: Secondary | ICD-10-CM

## 2017-12-21 NOTE — Therapy (Signed)
[facility] [place], [place], [address] Phone: [phone]   Fax:  [phone]  Physical Therapy Treatment  Patient Details  Name: Thomas Vega MRN: [id number] Date of Birth: [birth year] Referring Provider (PT): Basil Dess MD.   Encounter Date: 12/21/2017  PT End of Session - 12/21/17 1349    Visit Number  9    Number of Visits  16    Date for PT Re-Evaluation  01/15/18    Authorization Type  Progress note every 10th visit, KX modifier at 15th visit    PT Start Time  1301    PT Stop Time  1344    PT Time Calculation (min)  43 min    Activity Tolerance  Patient tolerated treatment well    Behavior During Therapy  [facility] for tasks assessed/performed       Past Medical History:  Diagnosis Date  . Abnormal finding of blood chemistry   . Bradycardia 07/01/2014  . CAP (community acquired pneumonia) 06/29/2014  . Heart murmur   . Irregular heart rate   . Melanoma ([place])   . Prostate cancer ([place])   . Thrombocytopenia ([place]) 06/30/2014    Past Surgical History:  Procedure Laterality Date  . BACK SURGERY    . CATARACT EXTRACTION    . KNEE SURGERY    . MELANOMA EXCISION      There were no vitals filed for this visit.  Subjective Assessment - 12/21/17 1304    Subjective  Patient arrived with no new complaints and did well after last treatment    Pertinent History  Spinal stenosis; prostate cancer; back surgery and right total knee replacement.    How long can you walk comfortably?  Short community distances with a standing straight cane.    Patient Stated Goals  Walk better andnot fall.    Currently in Pain?  Yes    Pain Score  7     Pain Location  Leg    Pain Orientation  Right    Pain Descriptors / Indicators  Sore    Pain Type  Acute pain    Pain Onset  1 to 4 weeks ago    Pain Frequency  Constant    Aggravating Factors   prolong standing    Pain Relieving Factors  at rest         [facility] PT Assessment -  12/21/17 0001      Berg Balance Test   Sit to Stand  Able to stand without using hands and stabilize independently    Standing Unsupported  Able to stand safely 2 minutes    Sitting with Back Unsupported but Feet Supported on Floor or Stool  Able to sit safely and securely 2 minutes    Stand to Sit  Sits safely with minimal use of hands    Transfers  Able to transfer safely, minor use of hands    Standing Unsupported with Eyes Closed  Unable to keep eyes closed 3 seconds but stays steady    Standing Ubsupported with Feet Together  Able to place feet together independently and stand for 1 minute with supervision    From Standing, Reach Forward with Outstretched Arm  Can reach confidently >25 cm (10")    From Standing Position, Pick up Object from Floor  Able to pick up shoe, needs supervision    From Standing Position, Turn to Look Behind Over each Shoulder  Looks behind one side only/other side shows less weight shift  Turn 360 Degrees  Able to turn 360 degrees safely but slowly    Standing Unsupported, Alternately Place Feet on Step/Stool  Able to complete 4 steps without aid or supervision    Standing Unsupported, One Foot in [employer] balance while stepping or standing    Standing on One Leg  Unable to try or needs assist to prevent fall    Total Score  38                   [facility] Adult PT Treatment/Exercise - 12/21/17 0001      Ambulation/Gait   Ambulation/Gait  Yes    Ambulation/Gait Assistance  6: Modified independent (Device/Increase time);5: Supervision    Ambulation Distance (Feet)  75 Feet    Assistive device  Small based quad cane;Straight cane    Gait Pattern  Step-to pattern    Ambulation Surface  Level;Indoor    Gait Comments  patient able to perform gait with good form after education      Exercises   Exercises  Lumbar;Knee/Hip      Lumbar Exercises: Aerobic   Nustep  Level 4 x15 min UE/LE, monitored      Lumbar Exercises: Seated   Other Seated  Lumbar Exercises  scapular retractions red theraband 2 x20    Other Seated Lumbar Exercises  seated draw ins with reach outs and D1/D2 2x10 each          Balance Exercises - 12/21/17 1330      Balance Exercises: Standing   Standing Eyes Opened  Narrow base of support (BOS);Wide (BOA);Foam/compliant surface;Solid surface;Time   2-69min each   Standing Eyes Closed  Limitations;10 secs   unable   Tandem Stance  Eyes open;2 reps    SLS  Limitations    Rockerboard  Anterior/posterior   67min   Sit to Stand Time  x10        PT Education - 12/21/17 1321    Education Details  HEP    Person(s) Educated  Patient    Methods  Explanation;Demonstration;Handout    Comprehension  Verbalized understanding;Returned demonstration       PT Short Term Goals - 12/21/17 1309      PT SHORT TERM GOAL #1   Title  Ind with initial HEP.    Time  2    Period  Weeks    Status  Achieved      PT SHORT TERM GOAL #2   Title  Improve Berg score to 33/56.    Time  2    Period  Weeks    Status  Achieved   38/56 12/21/17       PT Long Term Goals - 12/21/17 1350      PT LONG TERM GOAL #1   Title  Ind with advanced HEP.    Time  8    Period  Weeks    Status  On-going      PT LONG TERM GOAL #2   Title  Negative Romberg test.    Time  8    Period  Weeks    Status  On-going   positive romberg today 12/21/17     PT LONG TERM GOAL #3   Title  Improve Berg score to 46/56.    Time  8    Period  Weeks    Status  On-going   38/56 12/21/17           Plan - 12/21/17 1351    Clinical  Impression Statement  Patient tolerated treatment well today. Patient able to progress balance exericses today and improve BERG score to 38/56. HEP given today. Patient has difficulty with any activity with eys closed or balance with small BOS at this time. Patient reported using cane some of the time and fell backward last week. Educated patient on use of cane at all times for safety. Goals ongoing.     Rehab  Potential  Good    PT Frequency  2x / week    PT Duration  8 weeks    PT Treatment/Interventions  ADLs/Self Care Home Management;Balance training;Therapeutic exercise;Therapeutic activities;Gait training;Neuromuscular re-education;Patient/family education    PT Next Visit Plan  Continue balance, LE strengthening and core strengthening    Consulted and Agree with Plan of Care  Patient       Patient will benefit from skilled therapeutic intervention in order to improve the following deficits and impairments:  Abnormal gait, Decreased activity tolerance, Decreased balance, Decreased coordination, Postural dysfunction, Pain, Difficulty walking  Visit Diagnosis: Chronic bilateral low back pain without sciatica  Unsteadiness on feet     Problem List Patient Active Problem List   Diagnosis Date Noted  . Chronic left shoulder pain 04/20/2016  . Spinal stenosis 09/02/2015  . Numbness 09/02/2015  . Bradycardia 07/01/2014  . Thrombocytopenia ([place]) 06/30/2014  . CAP (community acquired pneumonia) 06/29/2014  . Hypoxia 06/29/2014  . HTN (hypertension) 06/29/2014  . Prostate cancer ([place]) 06/29/2014  . Hyponatremia 06/29/2014    Melaya Hoselton P, PTA 12/21/2017, 1:57 PM  [facility] [place], [place], [address] Phone: [phone]   Fax:  [phone]  Name: Thomas Vega MRN: [id number] Date of Birth: Dec 19, 1926

## 2017-12-21 NOTE — Patient Instructions (Signed)
Pelvic Tilt: Posterior - Legs Bent (Supine)  Tighten stomach and flatten back by rolling pelvis down. Hold _10___ seconds. Relax. Repeat _10-30___ times per set. Do __2__ sets per session. Do _2___ sessions per day.    Toe-Up (Ankle Plantar Flexion and Dorsiflexion)   Holding a stable object, rise up on toes. Hold ____ seconds. Then rock back on heels and Hold 2____ seconds. Repeat _5-10___ times. Do _1-2___ sessions per day.    Bridging   Slowly raise buttocks from floor, keeping stomach tight. Repeat _10___ times per set. Do __2__ sets per session. Do __2__ sessions per day.         Scapular Retraction: Bilateral  Facing anchor, pull arms back, bringing shoulder blades together. Repeat _30___ times per set. Do __2-3__ sets per session. Do _2___ sessions per day.    Half Squat to Chair   Stand with feet shoulder width apart. Push buttocks backward and lower slowly, sitting in chair lightly and returning to standing position. Complete _2_ sets of 10_ repetitions. Perform __2-3_ sessions per day.

## 2017-12-26 ENCOUNTER — Ambulatory Visit: Payer: Medicare Other | Attending: Specialist | Admitting: *Deleted

## 2017-12-26 DIAGNOSIS — M545 Low back pain: Secondary | ICD-10-CM | POA: Insufficient documentation

## 2017-12-26 DIAGNOSIS — G8929 Other chronic pain: Secondary | ICD-10-CM | POA: Diagnosis not present

## 2017-12-26 DIAGNOSIS — M25612 Stiffness of left shoulder, not elsewhere classified: Secondary | ICD-10-CM | POA: Diagnosis not present

## 2017-12-26 DIAGNOSIS — M6281 Muscle weakness (generalized): Secondary | ICD-10-CM | POA: Diagnosis not present

## 2017-12-26 DIAGNOSIS — M25512 Pain in left shoulder: Secondary | ICD-10-CM | POA: Insufficient documentation

## 2017-12-26 DIAGNOSIS — R2681 Unsteadiness on feet: Secondary | ICD-10-CM

## 2017-12-26 NOTE — Therapy (Signed)
[facility] [place], [place], [address] Phone: [phone]   Fax:  [phone]  Physical Therapy Treatment   Progress Note Reporting Period 10/17/17  to 12/26/17  See note below for Objective Data and Assessment of Progress/Goals.       Patient Details  Name: Thomas Vega MRN: [id number] Date of Birth: 26-Aug-1926 Referring Provider (PT): Basil Dess MD.   Encounter Date: 12/26/2017  PT End of Session - 12/26/17 1309    Visit Number  10    Number of Visits  16    Date for PT Re-Evaluation  01/15/18    Authorization Type  Progress note every 10th visit, KX modifier at 15th visit    PT Start Time  1301    PT Stop Time  1351    PT Time Calculation (min)  50 min       Past Medical History:  Diagnosis Date  . Abnormal finding of blood chemistry   . Bradycardia 07/01/2014  . CAP (community acquired pneumonia) 06/29/2014  . Heart murmur   . Irregular heart rate   . Melanoma ([place])   . Prostate cancer ([place])   . Thrombocytopenia ([place]) 06/30/2014    Past Surgical History:  Procedure Laterality Date  . BACK SURGERY    . CATARACT EXTRACTION    . KNEE SURGERY    . MELANOMA EXCISION      There were no vitals filed for this visit.                    [place] Adult PT Treatment/Exercise - 12/26/17 0001      Exercises   Exercises  Lumbar;Knee/Hip      Lumbar Exercises: Aerobic   Nustep  Level 4 x15 min UE/LE, monitored      Lumbar Exercises: Seated   Sit to Stand  10 reps    Other Seated Lumbar Exercises  scapular retractions red theraband 2 x20    Other Seated Lumbar Exercises  seated draw ins with reach outs and D1/D2 2x10 each          Balance Exercises - 12/26/17 1318      Balance Exercises: Standing   Standing Eyes Opened  Narrow base of support (BOS);Wide (BOA);Foam/compliant surface;Solid surface;Time   2-19min each  with UE reaches   Standing Eyes Closed  Wide (BOA);5 reps   5 secs    Rockerboard  Anterior/posterior   69min   Partial Tandem Stance  Eyes open;Intermittent upper extremity support;5 reps   with RT forward and then LT   Sit to Stand Time  x10          PT Short Term Goals - 12/21/17 1309      PT SHORT TERM GOAL #1   Title  Ind with initial HEP.    Time  2    Period  Weeks    Status  Achieved      PT SHORT TERM GOAL #2   Title  Improve Berg score to 33/56.    Time  2    Period  Weeks    Status  Achieved   38/56 12/21/17       PT Long Term Goals - 12/21/17 1350      PT LONG TERM GOAL #1   Title  Ind with advanced HEP.    Time  8    Period  Weeks    Status  On-going      PT LONG TERM GOAL #2  Title  Negative Romberg test.    Time  8    Period  Weeks    Status  On-going   positive romberg today 12/21/17     PT LONG TERM GOAL #3   Title  Improve Berg score to 46/56.    Time  8    Period  Weeks    Status  On-going   38/56 12/21/17           Plan - 12/26/17 1311    Clinical Impression Statement  Pt arrived today doing fairly well. He was able to perform therex for LE strengthening and balance activities without complaints. Tandem Act.'s were challenging as well as standing with eyes closed was also a challenge. Berg score has improved to 38/56 at this point. Continue with skilled PT for progression.     Clinical Presentation  Evolving    Rehab Potential  Good    PT Frequency  2x / week    PT Duration  8 weeks    PT Treatment/Interventions  ADLs/Self Care Home Management;Balance training;Therapeutic exercise;Therapeutic activities;Gait training;Neuromuscular re-education;Patient/family education    PT Next Visit Plan  Continue balance, LE strengthening and core strengthening    Consulted and Agree with Plan of Care  Patient       Patient will benefit from skilled therapeutic intervention in order to improve the following deficits and impairments:  Abnormal gait, Decreased activity tolerance, Decreased balance, Decreased  coordination, Postural dysfunction, Pain, Difficulty walking  Visit Diagnosis: Chronic bilateral low back pain without sciatica  Unsteadiness on feet  Stiffness of left shoulder, not elsewhere classified  Muscle weakness (generalized)  Acute pain of left shoulder     Problem List Patient Active Problem List   Diagnosis Date Noted  . Chronic left shoulder pain 04/20/2016  . Spinal stenosis 09/02/2015  . Numbness 09/02/2015  . Bradycardia 07/01/2014  . Thrombocytopenia ([place]) 06/30/2014  . CAP (community acquired pneumonia) 06/29/2014  . Hypoxia 06/29/2014  . HTN (hypertension) 06/29/2014  . Prostate cancer ([place]) 06/29/2014  . Hyponatremia 06/29/2014    Thomas Vega,Thomas Vega, Thomas Vega 12/26/2017, 2:02 PM  [facility] [place], [place], [address] Phone: [phone]   Fax:  [phone]  Name: Thomas Vega MRN: [id number] Date of Birth: 03/22/1927

## 2017-12-28 ENCOUNTER — Ambulatory Visit: Payer: Medicare Other | Admitting: Physical Therapy

## 2017-12-28 ENCOUNTER — Encounter: Payer: Self-pay | Admitting: Physical Therapy

## 2017-12-28 DIAGNOSIS — R2681 Unsteadiness on feet: Secondary | ICD-10-CM

## 2017-12-28 DIAGNOSIS — M25512 Pain in left shoulder: Secondary | ICD-10-CM | POA: Diagnosis not present

## 2017-12-28 DIAGNOSIS — G8929 Other chronic pain: Secondary | ICD-10-CM

## 2017-12-28 DIAGNOSIS — M25612 Stiffness of left shoulder, not elsewhere classified: Secondary | ICD-10-CM | POA: Diagnosis not present

## 2017-12-28 DIAGNOSIS — M545 Low back pain: Principal | ICD-10-CM

## 2017-12-28 DIAGNOSIS — M6281 Muscle weakness (generalized): Secondary | ICD-10-CM | POA: Diagnosis not present

## 2017-12-28 NOTE — Therapy (Signed)
[facility] [place], [place], [address] Phone: [phone]   Fax:  [phone]  Physical Therapy Treatment  Patient Details  Name: Thomas Vega MRN: [id number] Date of Birth: 1926/12/31 Referring Provider (PT): Basil Dess MD.   Encounter Date: 12/28/2017  PT End of Session - 12/28/17 1345    Visit Number  11    Number of Visits  16    Date for PT Re-Evaluation  01/15/18    Authorization Type  Progress note every 10th visit, KX modifier at 15th visit    PT Start Time  1300    PT Stop Time  1344    PT Time Calculation (min)  44 min    Activity Tolerance  Patient tolerated treatment well    Behavior During Therapy  [facility] for tasks assessed/performed       Past Medical History:  Diagnosis Date  . Abnormal finding of blood chemistry   . Bradycardia 07/01/2014  . CAP (community acquired pneumonia) 06/29/2014  . Heart murmur   . Irregular heart rate   . Melanoma ([place])   . Prostate cancer ([place])   . Thrombocytopenia ([place]) 06/30/2014    Past Surgical History:  Procedure Laterality Date  . BACK SURGERY    . CATARACT EXTRACTION    . KNEE SURGERY    . MELANOMA EXCISION      There were no vitals filed for this visit.  Subjective Assessment - 12/28/17 1304    Subjective  Patient arrived and reported doing well today, some ongoing soreness in leg yet not as bad today, no falls reported    Pertinent History  Spinal stenosis; prostate cancer; back surgery and right total knee replacement.    How long can you walk comfortably?  Short community distances with a standing straight cane.    Patient Stated Goals  Walk better andnot fall.    Currently in Pain?  Yes    Pain Score  5     Pain Location  Leg    Pain Orientation  Right    Pain Descriptors / Indicators  Discomfort    Pain Type  Chronic pain    Pain Onset  More than a month ago    Pain Frequency  Constant    Aggravating Factors   prolong standing or walking    Pain  Relieving Factors  at rest                       [facility] Adult PT Treatment/Exercise - 12/28/17 0001      Lumbar Exercises: Aerobic   Nustep  Level 4 x15 min UE/LE, monitored      Lumbar Exercises: Seated   Other Seated Lumbar Exercises  scapular retractions red theraband while sitting on bosu on mat table2 x20    Other Seated Lumbar Exercises  seated marching on bosu on matt table with marching          Balance Exercises - 12/28/17 1337      Balance Exercises: Standing   Standing Eyes Opened  Narrow base of support (BOS);Wide (BOA);Foam/compliant surface;Solid surface;Time    Standing Eyes Closed  Wide (BOA);Solid surface;1 rep;Limitations   unable to stand more than 4 seconds   Rockerboard  Anterior/posterior   30mn   Retro Gait  5 reps;4 reps;1 rep    Sidestepping  Foam/compliant support;5 reps;3 reps    Sit to Stand Time  x10          PT  Short Term Goals - 12/21/17 1309      PT SHORT TERM GOAL #1   Title  Ind with initial HEP.    Time  2    Period  Weeks    Status  Achieved      PT SHORT TERM GOAL #2   Title  Improve Berg score to 33/56.    Time  2    Period  Weeks    Status  Achieved   38/56 12/21/17       PT Long Term Goals - 12/28/17 1316      PT LONG TERM GOAL #1   Title  Ind with advanced HEP.    Baseline  Met 12/28/17    Time  8    Period  Weeks    Status  Achieved   12/28/17     PT LONG TERM GOAL #2   Title  Negative Romberg test.    Time  8    Period  Weeks    Status  On-going   positive Romberg test today 12/28/17     PT LONG TERM GOAL #3   Title  Improve Berg score to 46/56.    Time  8    Period  Weeks    Status  On-going   38/56 12/21/17           Plan - 12/28/17 1345    Clinical Impression Statement  Patient tolerated treatment well today. Patient reported less discomfort in LE yet ongoing. Patient able to progress with balance activities today yet required SBA/CGA at all times for safety. Patient unable to  stand with eyas closed more than 4 seconds. Patient reported no falls and progressing toward goals.     Rehab Potential  Good    PT Frequency  2x / week    PT Duration  8 weeks    PT Treatment/Interventions  ADLs/Self Care Home Management;Balance training;Therapeutic exercise;Therapeutic activities;Gait training;Neuromuscular re-education;Patient/family education    PT Next Visit Plan  Continue balance, LE strengthening and core strengthening    Consulted and Agree with Plan of Care  Patient       Patient will benefit from skilled therapeutic intervention in order to improve the following deficits and impairments:  Abnormal gait, Decreased activity tolerance, Decreased balance, Decreased coordination, Postural dysfunction, Pain, Difficulty walking  Visit Diagnosis: Chronic bilateral low back pain without sciatica  Unsteadiness on feet     Problem List Patient Active Problem List   Diagnosis Date Noted  . Chronic left shoulder pain 04/20/2016  . Spinal stenosis 09/02/2015  . Numbness 09/02/2015  . Bradycardia 07/01/2014  . Thrombocytopenia ([place]) 06/30/2014  . CAP (community acquired pneumonia) 06/29/2014  . Hypoxia 06/29/2014  . HTN (hypertension) 06/29/2014  . Prostate cancer ([place]) 06/29/2014  . Hyponatremia 06/29/2014    Lubertha Leite P, PTA 12/28/2017, 1:50 PM  [facility] [address] [place], [place], [address] Phone: [phone]   Fax:  [phone]  Name: Thomas Vega MRN: [id number] Date of Birth: 1926/10/04

## 2018-01-01 ENCOUNTER — Encounter (INDEPENDENT_AMBULATORY_CARE_PROVIDER_SITE_OTHER): Payer: Medicare Other | Admitting: Ophthalmology

## 2018-01-01 DIAGNOSIS — H35033 Hypertensive retinopathy, bilateral: Secondary | ICD-10-CM | POA: Diagnosis not present

## 2018-01-01 DIAGNOSIS — H43813 Vitreous degeneration, bilateral: Secondary | ICD-10-CM

## 2018-01-01 DIAGNOSIS — I10 Essential (primary) hypertension: Secondary | ICD-10-CM | POA: Diagnosis not present

## 2018-01-01 DIAGNOSIS — H353122 Nonexudative age-related macular degeneration, left eye, intermediate dry stage: Secondary | ICD-10-CM | POA: Diagnosis not present

## 2018-01-01 DIAGNOSIS — H353211 Exudative age-related macular degeneration, right eye, with active choroidal neovascularization: Secondary | ICD-10-CM | POA: Diagnosis not present

## 2018-01-03 ENCOUNTER — Encounter: Payer: Self-pay | Admitting: Physical Therapy

## 2018-01-03 ENCOUNTER — Ambulatory Visit: Payer: Medicare Other | Admitting: Physical Therapy

## 2018-01-03 DIAGNOSIS — G8929 Other chronic pain: Secondary | ICD-10-CM

## 2018-01-03 DIAGNOSIS — R2681 Unsteadiness on feet: Secondary | ICD-10-CM

## 2018-01-03 DIAGNOSIS — M545 Low back pain, unspecified: Secondary | ICD-10-CM

## 2018-01-03 DIAGNOSIS — M25512 Pain in left shoulder: Secondary | ICD-10-CM | POA: Diagnosis not present

## 2018-01-03 DIAGNOSIS — M25612 Stiffness of left shoulder, not elsewhere classified: Secondary | ICD-10-CM | POA: Diagnosis not present

## 2018-01-03 DIAGNOSIS — M6281 Muscle weakness (generalized): Secondary | ICD-10-CM | POA: Diagnosis not present

## 2018-01-03 NOTE — Therapy (Signed)
[facility] [place], [place], [address] Phone: [phone]   Fax:  [phone]  Physical Therapy Treatment  Patient Details  Name: Thomas Vega MRN: [id number] Date of Birth: 03/23/1927 Referring Provider (PT): Basil Dess MD.   Encounter Date: 01/03/2018  PT End of Session - 01/03/18 1128    Visit Number  12    Number of Visits  16    Date for PT Re-Evaluation  01/15/18    Authorization Type  Progress note every 10th visit, KX modifier at 15th visit    PT Start Time  1115    PT Stop Time  1200    PT Time Calculation (min)  45 min    Activity Tolerance  Patient tolerated treatment well    Behavior During Therapy  [facility] for tasks assessed/performed       Past Medical History:  Diagnosis Date  . Abnormal finding of blood chemistry   . Bradycardia 07/01/2014  . CAP (community acquired pneumonia) 06/29/2014  . Heart murmur   . Irregular heart rate   . Melanoma ([place])   . Prostate cancer ([place])   . Thrombocytopenia ([employer]) 06/30/2014    Past Surgical History:  Procedure Laterality Date  . BACK SURGERY    . CATARACT EXTRACTION    . KNEE SURGERY    . MELANOMA EXCISION      There were no vitals filed for this visit.  Subjective Assessment - 01/03/18 1123    Subjective  Reports feeling "okay" still having some ongoing pain in right posterior thigh.    Pertinent History  Spinal stenosis; prostate cancer; back surgery and right total knee replacement.    How long can you walk comfortably?  Short community distances with a standing straight cane.    Patient Stated Goals  Walk better andnot fall.    Currently in Pain?  Yes    Pain Score  5     Pain Location  Leg    Pain Orientation  Right    Pain Descriptors / Indicators  Discomfort    Pain Type  Chronic pain    Pain Onset  More than a month ago    Pain Frequency  Constant         [facility] PT Assessment - 01/03/18 0001      Assessment   Medical Diagnosis  Lumbar  spinal stenosis; balance problems.                   [place] Adult PT Treatment/Exercise - 01/03/18 0001      Exercises   Exercises  Lumbar;Knee/Hip      Lumbar Exercises: Aerobic   Nustep  Level 5 x15 UE/LE      Lumbar Exercises: Seated   Other Seated Lumbar Exercises  scapular retractions red theraband x20      Knee/Hip Exercises: Standing   Heel Raises  Both;20 reps    Hip Abduction  Stengthening;Both;20 reps;Knee straight;Limitations      Knee/Hip Exercises: Seated   Clamshell with TheraBand  Red   20   Hamstring Curl  Strengthening;Both;2 sets;10 reps    Hamstring Limitations  Red theraband          Balance Exercises - 01/03/18 1134      Balance Exercises: Standing   Standing Eyes Opened  Narrow base of support (BOS);Wide (BOA);Foam/compliant surface;Solid surface;Time    Rockerboard  Anterior/posterior;UE support   5 mins   Marching Limitations  marching on 6" step with 1 UE support  Sit to Stand Time  x10          PT Short Term Goals - 12/21/17 1309      PT SHORT TERM GOAL #1   Title  Ind with initial HEP.    Time  2    Period  Weeks    Status  Achieved      PT SHORT TERM GOAL #2   Title  Improve Berg score to 33/56.    Time  2    Period  Weeks    Status  Achieved   38/56 12/21/17       PT Long Term Goals - 12/28/17 1316      PT LONG TERM GOAL #1   Title  Ind with advanced HEP.    Baseline  Met 12/28/17    Time  8    Period  Weeks    Status  Achieved   12/28/17     PT LONG TERM GOAL #2   Title  Negative Romberg test.    Time  8    Period  Weeks    Status  On-going   positive Romberg test today 12/28/17     PT LONG TERM GOAL #3   Title  Improve Berg score to 46/56.    Time  8    Period  Weeks    Status  On-going   38/56 12/21/17           Plan - 01/03/18 1128    Clinical Impression Statement  Patient was able to tolerate treatment fairly. Patient required multiple CG assists to min assists for sit to stands.  Patient required CG assist to min A as patient was unsteady on his feet while walking around the clinic. Patient stated he felt more stable after today's session.     Clinical Presentation  Evolving    Clinical Decision Making  Moderate    Rehab Potential  Good    PT Frequency  2x / week    PT Duration  8 weeks    PT Treatment/Interventions  ADLs/Self Care Home Management;Balance training;Therapeutic exercise;Therapeutic activities;Gait training;Neuromuscular re-education;Patient/family education    PT Next Visit Plan  Continue balance, LE strengthening and core strengthening    Consulted and Agree with Plan of Care  Patient       Patient will benefit from skilled therapeutic intervention in order to improve the following deficits and impairments:  Abnormal gait, Decreased activity tolerance, Decreased balance, Decreased coordination, Postural dysfunction, Pain, Difficulty walking  Visit Diagnosis: Chronic bilateral low back pain without sciatica  Unsteadiness on feet     Problem List Patient Active Problem List   Diagnosis Date Noted  . Chronic left shoulder pain 04/20/2016  . Spinal stenosis 09/02/2015  . Numbness 09/02/2015  . Bradycardia 07/01/2014  . Thrombocytopenia ([place]) 06/30/2014  . CAP (community acquired pneumonia) 06/29/2014  . Hypoxia 06/29/2014  . HTN (hypertension) 06/29/2014  . Prostate cancer ([place]) 06/29/2014  . Hyponatremia 06/29/2014   Gabriela Eves, PT, DPT 01/03/2018, 12:40 PM  [facility] [address] [place], [place], [address] Phone: [phone]   Fax:  [phone]  Name: Thomas Vega MRN: [id number] Date of Birth: 21-Mar-1927

## 2018-01-05 ENCOUNTER — Encounter: Payer: Medicare Other | Admitting: Physical Therapy

## 2018-01-09 ENCOUNTER — Ambulatory Visit: Payer: Medicare Other | Admitting: Physical Therapy

## 2018-01-09 ENCOUNTER — Encounter: Payer: Self-pay | Admitting: Physical Therapy

## 2018-01-09 DIAGNOSIS — M25612 Stiffness of left shoulder, not elsewhere classified: Secondary | ICD-10-CM | POA: Diagnosis not present

## 2018-01-09 DIAGNOSIS — M25512 Pain in left shoulder: Secondary | ICD-10-CM | POA: Diagnosis not present

## 2018-01-09 DIAGNOSIS — G8929 Other chronic pain: Secondary | ICD-10-CM

## 2018-01-09 DIAGNOSIS — R2681 Unsteadiness on feet: Secondary | ICD-10-CM

## 2018-01-09 DIAGNOSIS — M545 Low back pain, unspecified: Secondary | ICD-10-CM

## 2018-01-09 DIAGNOSIS — M6281 Muscle weakness (generalized): Secondary | ICD-10-CM | POA: Diagnosis not present

## 2018-01-09 NOTE — Therapy (Signed)
[facility] [place], [place], [address] Phone: [phone]   Fax:  [phone]  Physical Therapy Treatment  Patient Details  Name: Thomas Vega MRN: [id number] Date of Birth: 06-29-1926 Referring Provider (PT): Basil Dess MD.   Encounter Date: 01/09/2018  PT End of Session - 01/09/18 1128    Visit Number  13    Number of Visits  16    Date for PT Re-Evaluation  01/15/18    Authorization Type  Progress note every 10th visit, KX modifier at 15th visit    PT Start Time  1118    PT Stop Time  1201    PT Time Calculation (min)  43 min    Activity Tolerance  Patient tolerated treatment well    Behavior During Therapy  [facility] for tasks assessed/performed       Past Medical History:  Diagnosis Date  . Abnormal finding of blood chemistry   . Bradycardia 07/01/2014  . CAP (community acquired pneumonia) 06/29/2014  . Heart murmur   . Irregular heart rate   . Melanoma ([place])   . Prostate cancer ([place])   . Thrombocytopenia ([place]) 06/30/2014    Past Surgical History:  Procedure Laterality Date  . BACK SURGERY    . CATARACT EXTRACTION    . KNEE SURGERY    . MELANOMA EXCISION      There were no vitals filed for this visit.  Subjective Assessment - 01/09/18 1126    Subjective  Patient reported feeling okay but did a lot yesterday causing pain in his back. Stated he was moving heavy objects in his basement.     Pertinent History  Spinal stenosis; prostate cancer; back surgery and right total knee replacement.    How long can you walk comfortably?  Short community distances with a standing straight cane.    Patient Stated Goals  Walk better andnot fall.    Currently in Pain?  Yes    Pain Score  5     Pain Location  Back    Pain Descriptors / Indicators  Discomfort    Pain Type  Chronic pain    Pain Onset  More than a month ago    Pain Frequency  Constant         [facility] PT Assessment - 01/09/18 0001      Assessment   Medical Diagnosis  Lumbar spinal stenosis; balance problems.                   [place] Adult PT Treatment/Exercise - 01/09/18 0001      Exercises   Exercises  Lumbar;Knee/Hip      Lumbar Exercises: Aerobic   Nustep  Level 5 x15 UE/LE      Knee/Hip Exercises: Seated   Clamshell with TheraBand  Red   3x10   Marching  Strengthening;Both;2 sets;10 reps    Sit to Sand  2 sets;10 reps;with UE support   L UE support         Balance Exercises - 01/09/18 1226      Balance Exercises: Standing   Standing Eyes Opened  Narrow base of support (BOS);Wide (BOA);Foam/compliant surface;Solid surface;Time;3 reps    Standing Eyes Closed  Wide (BOA);Solid surface;1 rep;Limitations    SLS with Vectors  Solid surface;Upper extremity assist 1;2 reps;Cognitive challenge    Retro Gait  5 reps;Upper extremity support    Step Over Hurdles / Cones  fwd stepping over colored pods x6 reps followed by lateral stepping over  colored pods x6 reps; UE support          PT Short Term Goals - 12/21/17 1309      PT SHORT TERM GOAL #1   Title  Ind with initial HEP.    Time  2    Period  Weeks    Status  Achieved      PT SHORT TERM GOAL #2   Title  Improve Berg score to 33/56.    Time  2    Period  Weeks    Status  Achieved   38/56 12/21/17       PT Long Term Goals - 12/28/17 1316      PT LONG TERM GOAL #1   Title  Ind with advanced HEP.    Baseline  Met 12/28/17    Time  8    Period  Weeks    Status  Achieved   12/28/17     PT LONG TERM GOAL #2   Title  Negative Romberg test.    Time  8    Period  Weeks    Status  On-going   positive Romberg test today 12/28/17     PT LONG TERM GOAL #3   Title  Improve Berg score to 46/56.    Time  8    Period  Weeks    Status  On-going   38/56 12/21/17           Plan - 01/09/18 1129    Clinical Impression Statement  Patient arrives ambulating with wife's walker as he left his home without his cane. Patient provided with straight  cane to ambulate in the clinic. Patient was able to tolerate treatment fairly.     Clinical Presentation  Evolving    Clinical Decision Making  Moderate    Rehab Potential  Good    PT Frequency  2x / week    PT Duration  8 weeks    PT Treatment/Interventions  ADLs/Self Care Home Management;Balance training;Therapeutic exercise;Therapeutic activities;Gait training;Neuromuscular re-education;Patient/family education    PT Next Visit Plan  Continue balance, LE strengthening and core strengthening    Consulted and Agree with Plan of Care  Patient       Patient will benefit from skilled therapeutic intervention in order to improve the following deficits and impairments:  Abnormal gait, Decreased activity tolerance, Decreased balance, Decreased coordination, Postural dysfunction, Pain, Difficulty walking  Visit Diagnosis: Chronic bilateral low back pain without sciatica  Unsteadiness on feet     Problem List Patient Active Problem List   Diagnosis Date Noted  . Chronic left shoulder pain 04/20/2016  . Spinal stenosis 09/02/2015  . Numbness 09/02/2015  . Bradycardia 07/01/2014  . Thrombocytopenia ([place]) 06/30/2014  . CAP (community acquired pneumonia) 06/29/2014  . Hypoxia 06/29/2014  . HTN (hypertension) 06/29/2014  . Prostate cancer ([place]) 06/29/2014  . Hyponatremia 06/29/2014   Gabriela Eves, PT, DPT 01/09/2018, 12:29 PM  [facility] [address] [place], [place], [address] Phone: [phone]   Fax:  [phone]  Name: Ercel Pepitone MRN: [id number] Date of Birth: [birth year]

## 2018-01-11 ENCOUNTER — Encounter: Payer: Self-pay | Admitting: Physical Therapy

## 2018-01-11 ENCOUNTER — Ambulatory Visit: Payer: Medicare Other | Admitting: Physical Therapy

## 2018-01-11 DIAGNOSIS — M6281 Muscle weakness (generalized): Secondary | ICD-10-CM | POA: Diagnosis not present

## 2018-01-11 DIAGNOSIS — M25612 Stiffness of left shoulder, not elsewhere classified: Secondary | ICD-10-CM | POA: Diagnosis not present

## 2018-01-11 DIAGNOSIS — R2681 Unsteadiness on feet: Secondary | ICD-10-CM

## 2018-01-11 DIAGNOSIS — G8929 Other chronic pain: Secondary | ICD-10-CM | POA: Diagnosis not present

## 2018-01-11 DIAGNOSIS — M545 Low back pain: Secondary | ICD-10-CM | POA: Diagnosis not present

## 2018-01-11 DIAGNOSIS — M25512 Pain in left shoulder: Secondary | ICD-10-CM | POA: Diagnosis not present

## 2018-01-11 NOTE — Therapy (Signed)
[facility] [place], [place], [address] Phone: [phone]   Fax:  [phone]  Physical Therapy Treatment  Patient Details  Name: Thomas Vega MRN: [id number] Date of Birth: [birth year] Referring Provider (PT): Basil Dess MD.   Encounter Date: 01/11/2018  PT End of Session - 01/11/18 1118    Visit Number  14    Number of Visits  16    Date for PT Re-Evaluation  01/15/18    Authorization Type  Progress note every 10th visit, KX modifier at 15th visit    PT Start Time  1115    PT Stop Time  1200    PT Time Calculation (min)  45 min    Activity Tolerance  Patient tolerated treatment well    Behavior During Therapy  [facility] for tasks assessed/performed       Past Medical History:  Diagnosis Date  . Abnormal finding of blood chemistry   . Bradycardia 07/01/2014  . CAP (community acquired pneumonia) 06/29/2014  . Heart murmur   . Irregular heart rate   . Melanoma ([place])   . Prostate cancer ([place])   . Thrombocytopenia ([place]) 06/30/2014    Past Surgical History:  Procedure Laterality Date  . BACK SURGERY    . CATARACT EXTRACTION    . KNEE SURGERY    . MELANOMA EXCISION      There were no vitals filed for this visit.  Subjective Assessment - 01/11/18 1117    Subjective  Patient reported feeling better.     Pertinent History  Spinal stenosis; prostate cancer; back surgery and right total knee replacement.    How long can you walk comfortably?  Short community distances with a standing straight cane.    Patient Stated Goals  Walk better andnot fall.    Pain Score  3     Pain Location  Back    Pain Orientation  Right    Pain Descriptors / Indicators  Discomfort    Pain Onset  More than a month ago    Pain Frequency  Constant         [facility] PT Assessment - 01/11/18 0001      Assessment   Medical Diagnosis  Lumbar spinal stenosis; balance problems.                   [place] Adult PT Treatment/Exercise -  01/11/18 0001      Exercises   Exercises  Lumbar;Knee/Hip      Lumbar Exercises: Aerobic   Nustep  Level 5 x15 UE/LE      Lumbar Exercises: Seated   Other Seated Lumbar Exercises  scapular retractions red theraband x20 followed by D2, press outs with 2# medicine ball    Other Seated Lumbar Exercises  --      Lumbar Exercises: Supine   Clam  20 reps;3 seconds    Bridge  Compliant;20 reps      Knee/Hip Exercises: Stretches   Passive Hamstring Stretch  Right;3 reps;20 seconds          Balance Exercises - 01/11/18 1124      Balance Exercises: Standing   Tandem Stance  Eyes open;2 reps;30 secs;Intermittent upper extremity support    SLS with Vectors  Solid surface;Upper extremity assist 1;2 reps;Cognitive challenge   color pod touches   Sidestepping  Foam/compliant support;Upper extremity support;5 reps    Cone Rotation  Intermittent upper extremity assist;Right turn;Left turn;Solid surface    Marching Limitations  marching two finger  assist x20          PT Short Term Goals - 12/21/17 1309      PT SHORT TERM GOAL #1   Title  Ind with initial HEP.    Time  2    Period  Weeks    Status  Achieved      PT SHORT TERM GOAL #2   Title  Improve Berg score to 33/56.    Time  2    Period  Weeks    Status  Achieved   38/56 12/21/17       PT Long Term Goals - 12/28/17 1316      PT LONG TERM GOAL #1   Title  Ind with advanced HEP.    Baseline  Met 12/28/17    Time  8    Period  Weeks    Status  Achieved   12/28/17     PT LONG TERM GOAL #2   Title  Negative Romberg test.    Time  8    Period  Weeks    Status  On-going   positive Romberg test today 12/28/17     PT LONG TERM GOAL #3   Title  Improve Berg score to 46/56.    Time  8    Period  Weeks    Status  On-going   38/56 12/21/17           Plan - 01/11/18 1208    Clinical Impression Statement  Patient was able to tolerate treatment fairly well. Patient still requires close supervision and min A to  maintain upright balance. Patient was able to reach out of base of support but required a forward step to prevent a fall.    Clinical Presentation  Evolving    Clinical Decision Making  Moderate    Rehab Potential  Good    PT Frequency  2x / week    PT Duration  8 weeks    PT Treatment/Interventions  ADLs/Self Care Home Management;Balance training;Therapeutic exercise;Therapeutic activities;Gait training;Neuromuscular re-education;Patient/family education    PT Next Visit Plan  Continue balance, LE strengthening and core strengthening    Consulted and Agree with Plan of Care  Patient       Patient will benefit from skilled therapeutic intervention in order to improve the following deficits and impairments:  Abnormal gait, Decreased activity tolerance, Decreased balance, Decreased coordination, Postural dysfunction, Pain, Difficulty walking  Visit Diagnosis: Chronic bilateral low back pain without sciatica  Unsteadiness on feet     Problem List Patient Active Problem List   Diagnosis Date Noted  . Chronic left shoulder pain 04/20/2016  . Spinal stenosis 09/02/2015  . Numbness 09/02/2015  . Bradycardia 07/01/2014  . Thrombocytopenia ([place]) 06/30/2014  . CAP (community acquired pneumonia) 06/29/2014  . Hypoxia 06/29/2014  . HTN (hypertension) 06/29/2014  . Prostate cancer ([place]) 06/29/2014  . Hyponatremia 06/29/2014   Gabriela Eves, PT, DPT 01/11/2018, 12:12 PM  [facility] [place], [place], [address] Phone: [phone]   Fax:  [phone]  Name: Melesio Madara MRN: [id number] Date of Birth: 01/05/1927

## 2018-01-16 ENCOUNTER — Ambulatory Visit: Payer: Medicare Other | Admitting: *Deleted

## 2018-01-16 DIAGNOSIS — R2681 Unsteadiness on feet: Secondary | ICD-10-CM | POA: Diagnosis not present

## 2018-01-16 DIAGNOSIS — M545 Low back pain, unspecified: Secondary | ICD-10-CM

## 2018-01-16 DIAGNOSIS — M25512 Pain in left shoulder: Secondary | ICD-10-CM

## 2018-01-16 DIAGNOSIS — M25612 Stiffness of left shoulder, not elsewhere classified: Secondary | ICD-10-CM | POA: Diagnosis not present

## 2018-01-16 DIAGNOSIS — G8929 Other chronic pain: Secondary | ICD-10-CM | POA: Diagnosis not present

## 2018-01-16 DIAGNOSIS — M6281 Muscle weakness (generalized): Secondary | ICD-10-CM

## 2018-01-16 NOTE — Therapy (Signed)
[facility] [place], [place], [address] Phone: [phone]   Fax:  [phone]  Physical Therapy Treatment  Patient Details  Name: Thomas Vega MRN: [id number] Date of Birth: January 28, 1927 Referring Provider (PT): Basil Dess MD.   Encounter Date: 01/16/2018  PT End of Session - 01/16/18 1456    Visit Number  15    Number of Visits  16    Date for PT Re-Evaluation  01/15/18    Authorization Type  Progress note every 10th visit, KX modifier at 15th visit    PT Start Time  1345    PT Stop Time  1433    PT Time Calculation (min)  48 min       Past Medical History:  Diagnosis Date  . Abnormal finding of blood chemistry   . Bradycardia 07/01/2014  . CAP (community acquired pneumonia) 06/29/2014  . Heart murmur   . Irregular heart rate   . Melanoma ([place])   . Prostate cancer ([place])   . Thrombocytopenia ([place]) 06/30/2014    Past Surgical History:  Procedure Laterality Date  . BACK SURGERY    . CATARACT EXTRACTION    . KNEE SURGERY    . MELANOMA EXCISION      There were no vitals filed for this visit.  Subjective Assessment - 01/16/18 1359    Subjective  Doing a little better at home with decreased pain in my legs    Pertinent History  Spinal stenosis; prostate cancer; back surgery and right total knee replacement.    How long can you walk comfortably?  Short community distances with a standing straight cane.    Patient Stated Goals  Walk better andnot fall.    Currently in Pain?  Yes    Pain Score  3     Pain Location  Back    Pain Orientation  Right    Pain Descriptors / Indicators  Discomfort    Pain Type  Chronic pain    Pain Onset  More than a month ago                       [facility] Adult PT Treatment/Exercise - 01/16/18 0001      Exercises   Exercises  Lumbar;Knee/Hip      Lumbar Exercises: Aerobic   Nustep  Level 4 x15 UE/LE      Lumbar Exercises: Seated   Other Seated Lumbar Exercises   scapular retractions red theraband x30 followed by D2, press outs with 2# medicine ball      Lumbar Exercises: Supine   Clam  20 reps;3 seconds;10 reps   red band     Knee/Hip Exercises: Stretches   Passive Hamstring Stretch  3 reps;20 seconds;Both      Knee/Hip Exercises: Standing   Rocker Board  3 minutes   balance  with CGA         Balance Exercises - 01/16/18 1424      Balance Exercises: Standing   Tandem Stance  Eyes open;30 secs;Intermittent upper extremity support;5 reps   looking close and away   SLS with Vectors  Solid surface;Upper extremity assist 1;Cognitive challenge;3 reps   color pod touches 3sets each LE   Step Ups  6 inch   toe taps 3x 20 with CGA   Sidestepping  Upper extremity support;5 reps;Foam/compliant support   over blue balance bar   CGA         PT Short Term Goals -  12/21/17 1309      PT SHORT TERM GOAL #1   Title  Ind with initial HEP.    Time  2    Period  Weeks    Status  Achieved      PT SHORT TERM GOAL #2   Title  Improve Berg score to 33/56.    Time  2    Period  Weeks    Status  Achieved   38/56 12/21/17       PT Long Term Goals - 12/28/17 1316      PT LONG TERM GOAL #1   Title  Ind with advanced HEP.    Baseline  Met 12/28/17    Time  8    Period  Weeks    Status  Achieved   12/28/17     PT LONG TERM GOAL #2   Title  Negative Romberg test.    Time  8    Period  Weeks    Status  On-going   positive Romberg test today 12/28/17     PT LONG TERM GOAL #3   Title  Improve Berg score to 46/56.    Time  8    Period  Weeks    Status  On-going   38/56 12/21/17           Plan - 01/16/18 1501    Clinical Impression Statement  Pt arrived today doing fairly well and reports that his legs are not bothering as much since startingPT. He was able to complete all core Exs and balance act.'s without complaints. He was challenged the most with toe tap ex and also with side stepovers balance bar. He did well with static exs  today and continues to progress overall.    Rehab Potential  Good    PT Frequency  2x / week    PT Duration  8 weeks    PT Treatment/Interventions  ADLs/Self Care Home Management;Balance training;Therapeutic exercise;Therapeutic activities;Gait training;Neuromuscular re-education;Patient/family education    PT Next Visit Plan  Continue balance, LE strengthening and core strengthening    Consulted and Agree with Plan of Care  Patient       Patient will benefit from skilled therapeutic intervention in order to improve the following deficits and impairments:  Abnormal gait, Decreased activity tolerance, Decreased balance, Decreased coordination, Postural dysfunction, Pain, Difficulty walking  Visit Diagnosis: Chronic bilateral low back pain without sciatica  Unsteadiness on feet  Stiffness of left shoulder, not elsewhere classified  Muscle weakness (generalized)  Acute pain of left shoulder     Problem List Patient Active Problem List   Diagnosis Date Noted  . Chronic left shoulder pain 04/20/2016  . Spinal stenosis 09/02/2015  . Numbness 09/02/2015  . Bradycardia 07/01/2014  . Thrombocytopenia ([place]) 06/30/2014  . CAP (community acquired pneumonia) 06/29/2014  . Hypoxia 06/29/2014  . HTN (hypertension) 06/29/2014  . Prostate cancer ([place]) 06/29/2014  . Hyponatremia 06/29/2014    RAMSEUR,CHRIS, PTA 01/16/2018, 3:06 PM  [facility] [place], [place], [address] Phone: [phone]   Fax:  [phone]  Name: Thomas Vega MRN: [id number] Date of Birth: [birth year]

## 2018-01-18 ENCOUNTER — Encounter: Payer: Self-pay | Admitting: Physical Therapy

## 2018-01-18 ENCOUNTER — Ambulatory Visit: Payer: Medicare Other | Admitting: Physical Therapy

## 2018-01-18 DIAGNOSIS — G8929 Other chronic pain: Secondary | ICD-10-CM

## 2018-01-18 DIAGNOSIS — M545 Low back pain: Secondary | ICD-10-CM | POA: Diagnosis not present

## 2018-01-18 DIAGNOSIS — R2681 Unsteadiness on feet: Secondary | ICD-10-CM

## 2018-01-18 DIAGNOSIS — M25612 Stiffness of left shoulder, not elsewhere classified: Secondary | ICD-10-CM | POA: Diagnosis not present

## 2018-01-18 DIAGNOSIS — M6281 Muscle weakness (generalized): Secondary | ICD-10-CM | POA: Diagnosis not present

## 2018-01-18 DIAGNOSIS — M25512 Pain in left shoulder: Secondary | ICD-10-CM | POA: Diagnosis not present

## 2018-01-18 NOTE — Therapy (Signed)
[facility] [place], [place], [address] Phone: [phone]   Fax:  [phone]  Physical Therapy Treatment  Patient Details  Name: Thomas Vega MRN: [id number] Date of Birth: 1927/02/22 Referring Provider (PT): Basil Dess MD.   Encounter Date: 01/18/2018  PT End of Session - 01/18/18 1457    Visit Number  16    Number of Visits  16    Date for PT Re-Evaluation  01/15/18    Authorization Type  Progress note every 10th visit, KX modifier at 15th visit    PT Start Time  1432    PT Stop Time  1513    PT Time Calculation (min)  41 min    Activity Tolerance  Patient tolerated treatment well    Behavior During Therapy  [facility] for tasks assessed/performed       Past Medical History:  Diagnosis Date  . Abnormal finding of blood chemistry   . Bradycardia 07/01/2014  . CAP (community acquired pneumonia) 06/29/2014  . Heart murmur   . Irregular heart rate   . Melanoma ([place])   . Prostate cancer ([place])   . Thrombocytopenia ([place]) 06/30/2014    Past Surgical History:  Procedure Laterality Date  . BACK SURGERY    . CATARACT EXTRACTION    . KNEE SURGERY    . MELANOMA EXCISION      There were no vitals filed for this visit.  Subjective Assessment - 01/18/18 1442    Subjective  Patient arrived with no new complaints and doing improved overall    Pertinent History  Spinal stenosis; prostate cancer; back surgery and right total knee replacement.    How long can you walk comfortably?  Short community distances with a standing straight cane.    Patient Stated Goals  Walk better andnot fall.    Currently in Pain?  Yes    Pain Score  3     Pain Location  Back    Pain Orientation  Right    Pain Descriptors / Indicators  Discomfort    Pain Type  Chronic pain    Pain Onset  More than a month ago    Pain Frequency  Constant    Aggravating Factors   prolong standing or walking    Pain Relieving Factors  at rest         [facility] PT  Assessment - 01/18/18 0001      Berg Balance Test   Sit to Stand  Able to stand without using hands and stabilize independently    Standing Unsupported  Able to stand safely 2 minutes    Sitting with Back Unsupported but Feet Supported on Floor or Stool  Able to sit safely and securely 2 minutes    Stand to Sit  Sits safely with minimal use of hands    Transfers  Able to transfer safely, minor use of hands    Standing Unsupported with Eyes Closed  Unable to keep eyes closed 3 seconds but stays steady    Standing Ubsupported with Feet Together  Able to place feet together independently and stand for 1 minute with supervision    From Standing, Reach Forward with Outstretched Arm  Can reach confidently >25 cm (10")    From Standing Position, Pick up Object from Floor  Able to pick up shoe, needs supervision    From Standing Position, Turn to Look Behind Over each Shoulder  Looks behind one side only/other side shows less weight shift  Turn 360 Degrees  Able to turn 360 degrees safely but slowly    Standing Unsupported, Alternately Place Feet on Step/Stool  Able to complete 4 steps without aid or supervision    Standing Unsupported, One Foot in [employer] balance while stepping or standing    Standing on One Leg  Unable to try or needs assist to prevent fall    Total Score  38                   [facility] Adult PT Treatment/Exercise - 01/18/18 0001      Lumbar Exercises: Aerobic   Nustep  Level 4 x16 UE/LE      Lumbar Exercises: Seated   Other Seated Lumbar Exercises  scapular retractions red theraband x30 followed by D2, press outs with 2# medicine ball          Balance Exercises - 01/18/18 1503      Balance Exercises: Standing   Sidestepping  Upper extremity support;5 reps;2 reps    Marching Limitations  2x10    Heel Raises Limitations  2x10    Toe Raise Limitations  2x10    Sit to Stand Time  2x10        PT Education - 01/18/18 1510    Education Details   advanced HEP    Person(s) Educated  Patient    Methods  Explanation;Demonstration;Handout    Comprehension  Verbalized understanding;Returned demonstration       PT Short Term Goals - 12/21/17 1309      PT SHORT TERM GOAL #1   Title  Ind with initial HEP.    Time  2    Period  Weeks    Status  Achieved      PT SHORT TERM GOAL #2   Title  Improve Berg score to 33/56.    Time  2    Period  Weeks    Status  Achieved   38/56 12/21/17       PT Long Term Goals - 01/18/18 1455      PT LONG TERM GOAL #1   Title  Ind with advanced HEP.    Baseline  Met 12/28/17    Time  8    Period  Weeks    Status  Achieved      PT LONG TERM GOAL #2   Title  Negative Romberg test.    Time  8    Period  Weeks    Status  Not Met   positive Romberg 01/18/18     PT LONG TERM GOAL #3   Title  Improve Berg score to 46/56.    Time  8    Period  Weeks    Status  Not Met   38/56           Plan - 01/18/18 1459    Clinical Impression Statement  Patient DC today to HEP. Patient unable to meet all goals due to ongoing balance deficts. Patient educated on ongoing use of assistive device and daily HEP.     Rehab Potential  Good    PT Frequency  2x / week    PT Duration  8 weeks    PT Treatment/Interventions  ADLs/Self Care Home Management;Balance training;Therapeutic exercise;Therapeutic activities;Gait training;Neuromuscular re-education;Patient/family education    PT Next Visit Plan  DC    Consulted and Agree with Plan of Care  Patient       Patient will benefit from skilled therapeutic intervention in order to  improve the following deficits and impairments:  Abnormal gait, Decreased activity tolerance, Decreased balance, Decreased coordination, Postural dysfunction, Pain, Difficulty walking  Visit Diagnosis: Chronic bilateral low back pain without sciatica  Unsteadiness on feet     Problem List Patient Active Problem List   Diagnosis Date Noted  . Chronic left shoulder pain  04/20/2016  . Spinal stenosis 09/02/2015  . Numbness 09/02/2015  . Bradycardia 07/01/2014  . Thrombocytopenia ([place]) 06/30/2014  . CAP (community acquired pneumonia) 06/29/2014  . Hypoxia 06/29/2014  . HTN (hypertension) 06/29/2014  . Prostate cancer ([place]) 06/29/2014  . Hyponatremia 06/29/2014    Ladean Raya, PTA 01/18/18 3:14 PM  [facility] [place], [place], [address] Phone: [phone]   Fax:  [phone]  Name: Peretz Thieme MRN: [id number] Date of Birth: 18-Jun-1926  PHYSICAL THERAPY DISCHARGE SUMMARY  Visits from Start of Care: 16.  Current functional level related to goals / functional outcomes: See above.   Remaining deficits: Patient with continued balance problems and a Berg score of 38/56.   Education / Equipment: HEP. Plan: Patient agrees to discharge.  Patient goals were partially met. Patient is being discharged due to                                                     ?????         Mali Applegate MPT

## 2018-01-18 NOTE — Patient Instructions (Signed)
  Half Squat to Chair   Stand with feet shoulder width apart. Push buttocks backward and lower slowly, sitting in chair lightly and returning to standing position. Complete _2_ sets of 10_ repetitions. Perform __2-3_ sessions per day.     Toe-Up (Ankle Plantar Flexion and Dorsiflexion)   Holding a stable object, rise up on toes. Hold ____ seconds. Then rock back on heels and Hold 2____ seconds. Repeat _5-10___ times. Do _1-2___ sessions per day.   High Stepping   Using support, lift knees, taking high steps. Repeat __5-10__ times. Do __1-2__ sessions per day.   Walk to the Side   Step to the side with stronger leg and follow with involved leg. Then return. Hold chair if necessary. Repeat __5-10__ times. Do _1-2___ sessions per day.    Bridging   Slowly raise buttocks from floor, keeping stomach tight. Repeat _10___ times per set. Do __2__ sets per session. Do __2__ sessions per day.  Scapular Retraction: Bilateral   Facing anchor, pull arms back, bringing shoulder blades together. Repeat _30___ times per set. Do __1-2__ sets per session. Do _1-2___ sessions per day.

## 2018-01-29 ENCOUNTER — Encounter (INDEPENDENT_AMBULATORY_CARE_PROVIDER_SITE_OTHER): Payer: Medicare Other | Admitting: Ophthalmology

## 2018-01-29 DIAGNOSIS — H43813 Vitreous degeneration, bilateral: Secondary | ICD-10-CM

## 2018-01-29 DIAGNOSIS — H353122 Nonexudative age-related macular degeneration, left eye, intermediate dry stage: Secondary | ICD-10-CM | POA: Diagnosis not present

## 2018-01-29 DIAGNOSIS — H353211 Exudative age-related macular degeneration, right eye, with active choroidal neovascularization: Secondary | ICD-10-CM

## 2018-01-29 DIAGNOSIS — H35033 Hypertensive retinopathy, bilateral: Secondary | ICD-10-CM | POA: Diagnosis not present

## 2018-01-29 DIAGNOSIS — I10 Essential (primary) hypertension: Secondary | ICD-10-CM | POA: Diagnosis not present

## 2018-02-12 DIAGNOSIS — C61 Malignant neoplasm of prostate: Secondary | ICD-10-CM | POA: Diagnosis not present

## 2018-02-13 ENCOUNTER — Other Ambulatory Visit: Payer: Self-pay

## 2018-02-14 DIAGNOSIS — R972 Elevated prostate specific antigen [PSA]: Secondary | ICD-10-CM | POA: Diagnosis not present

## 2018-02-14 DIAGNOSIS — N403 Nodular prostate with lower urinary tract symptoms: Secondary | ICD-10-CM | POA: Diagnosis not present

## 2018-02-14 DIAGNOSIS — R3912 Poor urinary stream: Secondary | ICD-10-CM | POA: Diagnosis not present

## 2018-02-14 DIAGNOSIS — C61 Malignant neoplasm of prostate: Secondary | ICD-10-CM | POA: Diagnosis not present

## 2018-02-14 DIAGNOSIS — I10 Essential (primary) hypertension: Secondary | ICD-10-CM | POA: Diagnosis not present

## 2018-02-19 IMAGING — MR MR LUMBAR SPINE W/O CM
4 of 5 series · 30 of 48 positions shown · non-contrast
Comparison: 10/27/2011

CLINICAL DATA: Low back pain when standing.

EXAM:
MRI LUMBAR SPINE WITHOUT CONTRAST
TECHNIQUE: Multiplanar, multisequence MR imaging of the lumbar spine was
performed. No intravenous contrast was administered.

[Series 2: T2 · sagittal · 4.0mm · 0.44mm/px · 5 of 14 slices shown (1 of 2)]
[im 1/14]
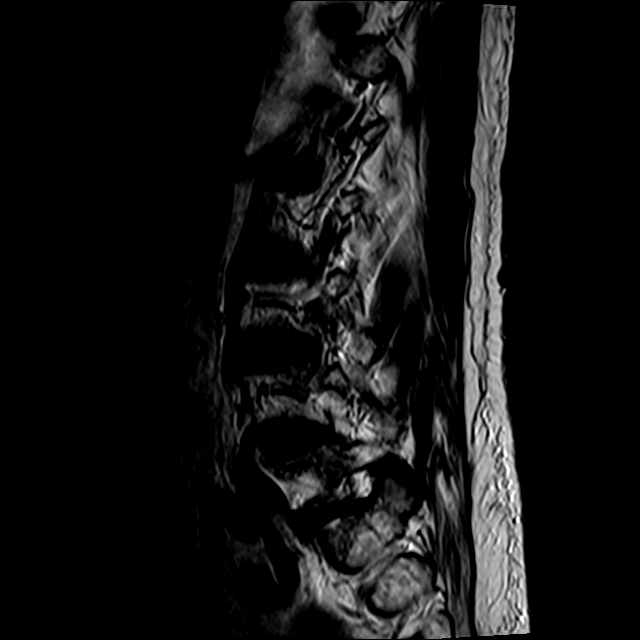
[im 4/14]
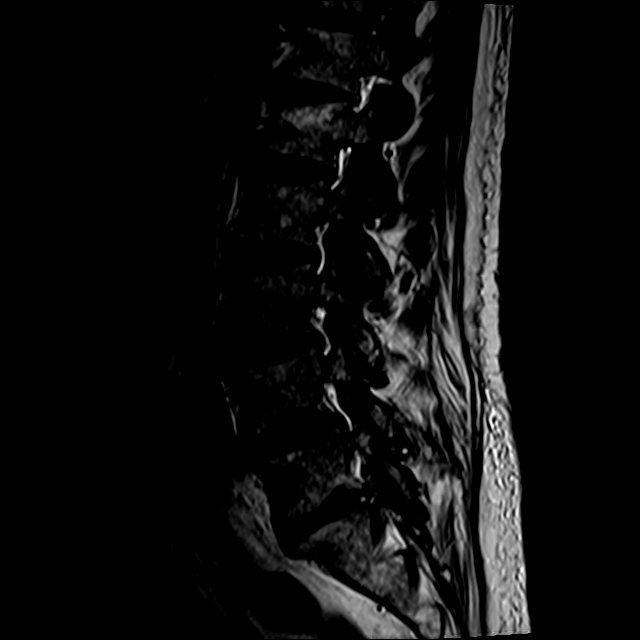
[im 7/14]
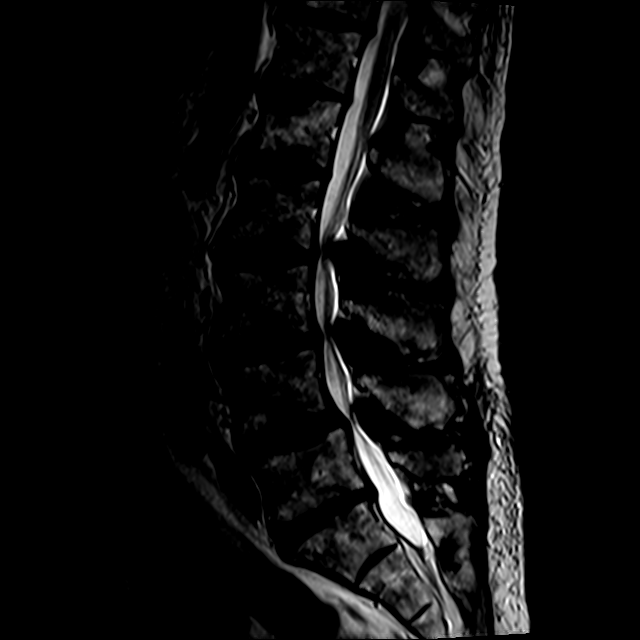
[im 10/14]
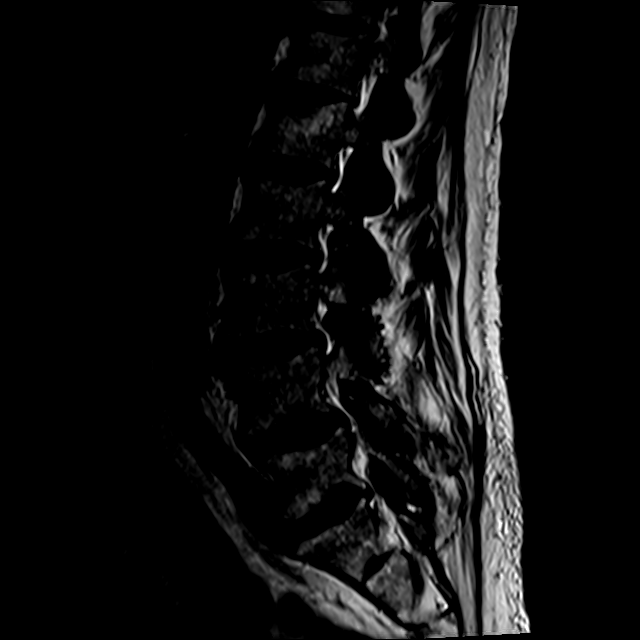
[im 14/14]
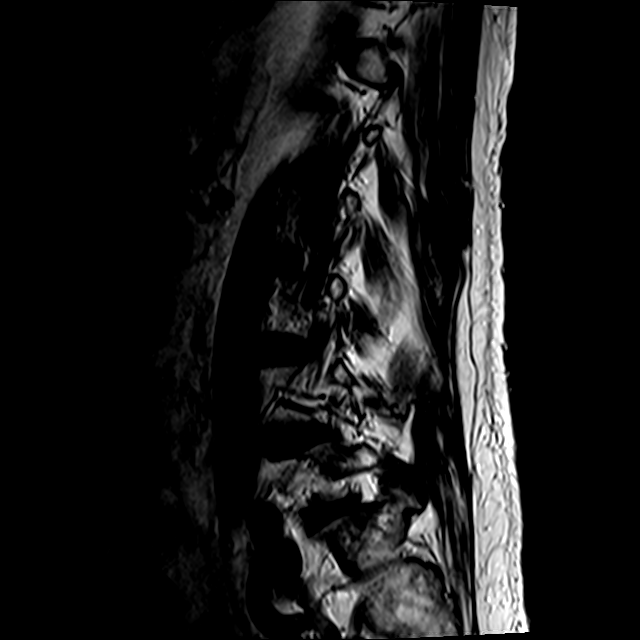

[Series 3: T1 · sagittal · 4.0mm · 0.55mm/px · 5 of 14 slices shown (1 of 2)]
[im 1/14]
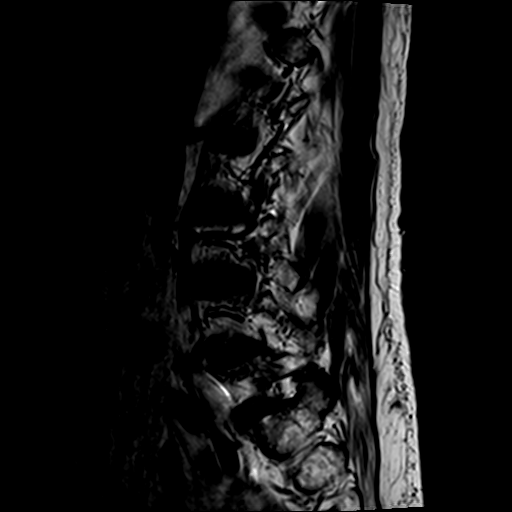
[im 4/14]
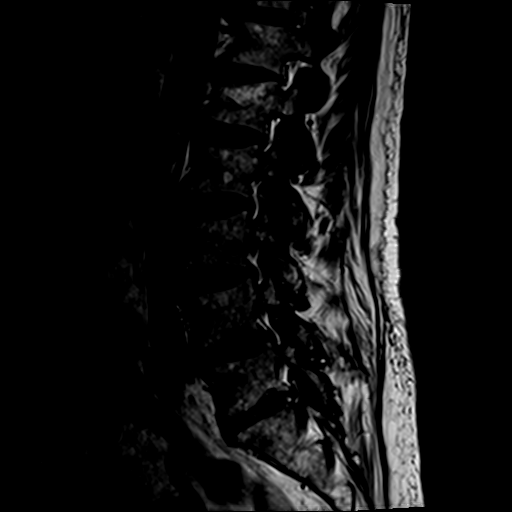
[im 7/14]
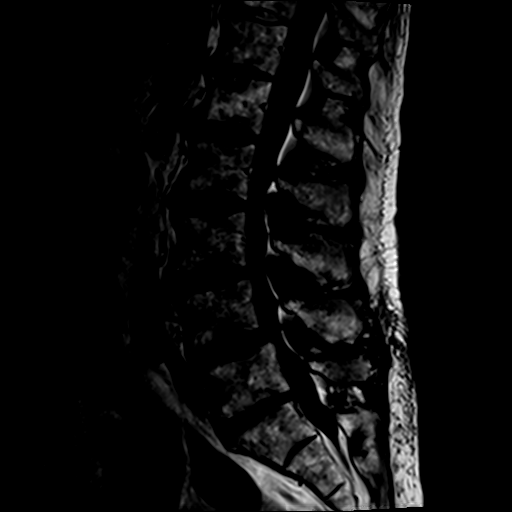
[im 10/14]
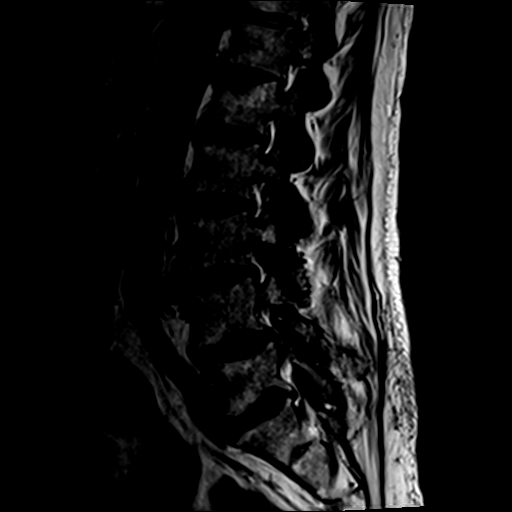
[im 14/14]
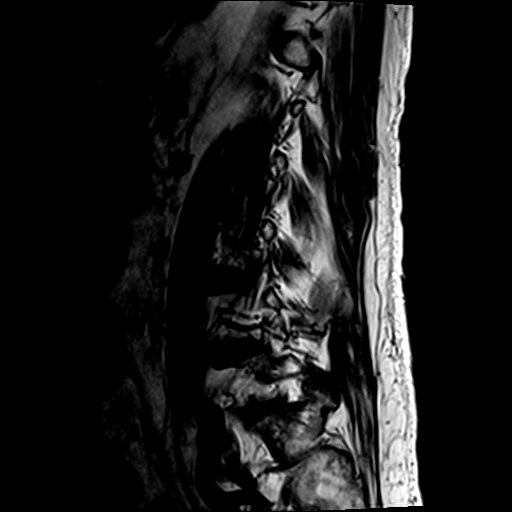

[Series 4: T2 · axial · 4.0mm · 0.78mm/px · z∈[-91,+89]mm · 10 of 39 slices shown (2 of 2)]
[im 3/39]
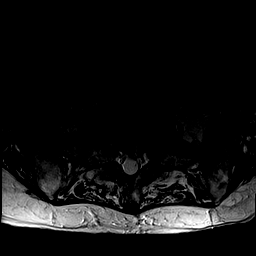
[im 6/39]
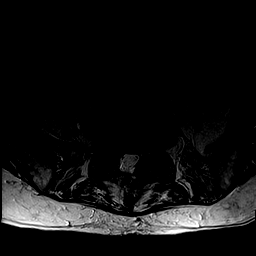
[im 8/39]
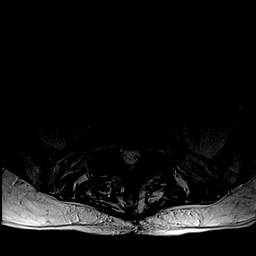
[im 13/39]
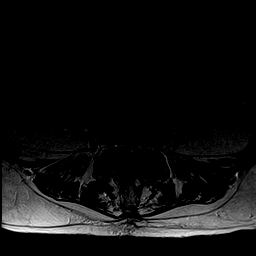
[im 18/39]
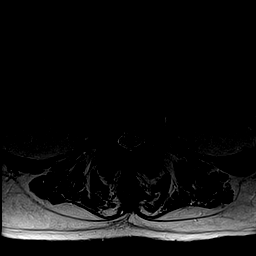
[im 21/39]
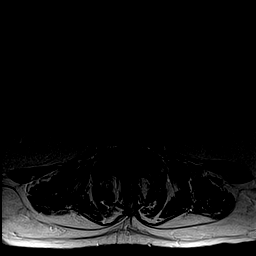
[im 23/39]
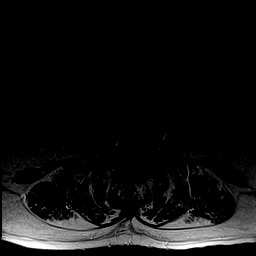
[im 28/39]
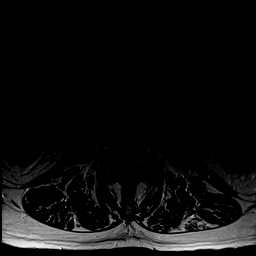
[im 33/39]
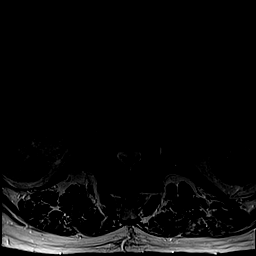
[im 39/39]
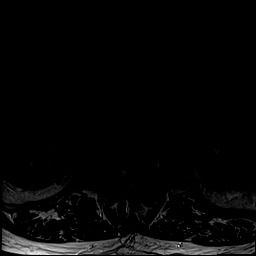

[Series 6: T1 · axial · 4.0mm · 0.78mm/px · z∈[-91,+89]mm · 10 of 39 slices shown (2 of 2)]
[im 3/39]
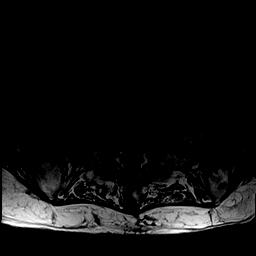
[im 6/39]
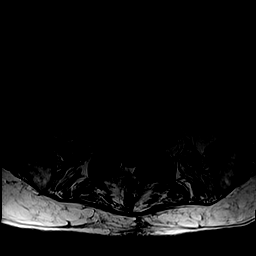
[im 8/39]
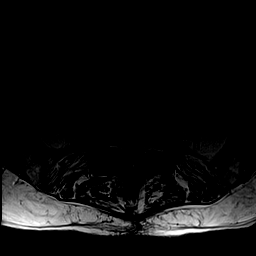
[im 13/39]
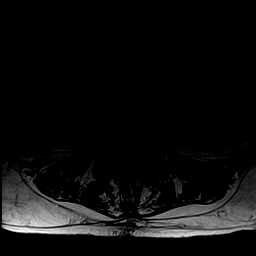
[im 18/39]
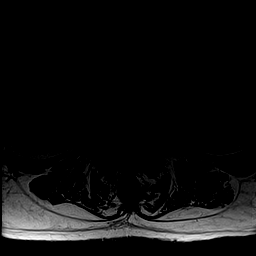
[im 21/39]
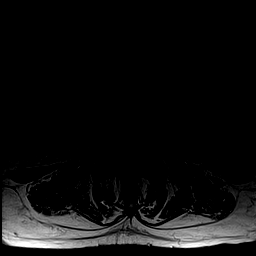
[im 23/39]
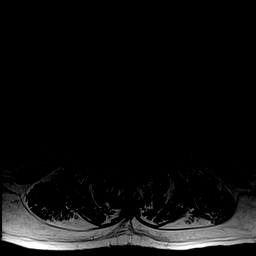
[im 28/39]
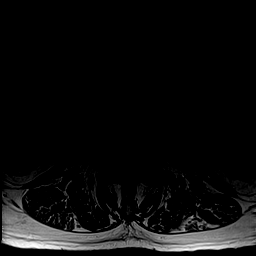
[im 33/39]
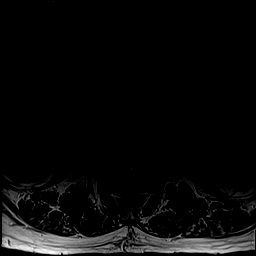
[im 39/39]
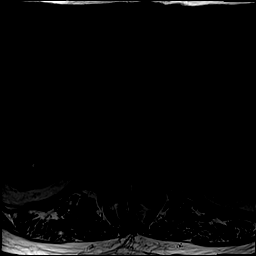

[30 of 48 positions shown; findings below may reference images not displayed]

FINDINGS: Segmentation: Conventional anatomy assistant, with the last open
disc space designated L5-S1.

Alignment: The vertebral bodies of the lumbar spine are normal in
alignment.

Bones: The vertebral bodies of the lumbar spine are normal in size.
There is normal bone marrow signal demonstrated throughout the
vertebra. The visualized portions of the SI joints are unremarkable.

Conus medullaris: Extends to the T12-L1 level and appears normal.
The nerve roots of the cauda equina and the filum terminale are
normal.

Paraspinal and other soft tissues: There is no focal abnormality.
The imaged intra-abdominal contents are unremarkable.

Disc levels:

Disc spaces: There is mild degenerative disc disease with disc
height loss at L5-S1.

T12-L1: No significant disc bulge. No evidence of neural foraminal
stenosis. No central canal stenosis.

L1-L2: Mild broad-based disc bulge. No evidence of neural foraminal
stenosis. No central canal stenosis.

L2-L3: Mild broad-based disc bulge. Severe bilateral facet
arthropathy. Severe spinal stenosis. No evidence of neural foraminal
stenosis.

L3-L4: Mild broad-based disc bulge. Severe bilateral facet
arthropathy and severe spinal stenosis. No evidence of neural
foraminal stenosis.

L4-L5: Mild broad-based disc bulge. Severe bilateral facet
arthropathy and severe spinal stenosis. No evidence of neural
foraminal stenosis.

L5-S1: Mild broad-based disc bulge with a tiny right paracentral
disc protrusion. No evidence of neural foraminal stenosis. No
central canal stenosis.
IMPRESSION: 1. At L2-3 and L3-4 there are mild broad-based disc bulges with
severe bilateral facet arthropathy and severe spinal stenosis.
2. At L4-5 there is a mild broad-based disc bulge with severe
bilateral facet arthropathy and severe spinal stenosis.

## 2018-02-26 ENCOUNTER — Encounter (INDEPENDENT_AMBULATORY_CARE_PROVIDER_SITE_OTHER): Payer: Medicare Other | Admitting: Ophthalmology

## 2018-02-26 DIAGNOSIS — H353122 Nonexudative age-related macular degeneration, left eye, intermediate dry stage: Secondary | ICD-10-CM

## 2018-02-26 DIAGNOSIS — I10 Essential (primary) hypertension: Secondary | ICD-10-CM | POA: Diagnosis not present

## 2018-02-26 DIAGNOSIS — H43813 Vitreous degeneration, bilateral: Secondary | ICD-10-CM

## 2018-02-26 DIAGNOSIS — H353211 Exudative age-related macular degeneration, right eye, with active choroidal neovascularization: Secondary | ICD-10-CM

## 2018-02-26 DIAGNOSIS — H35033 Hypertensive retinopathy, bilateral: Secondary | ICD-10-CM | POA: Diagnosis not present

## 2018-03-03 DIAGNOSIS — Z23 Encounter for immunization: Secondary | ICD-10-CM | POA: Diagnosis not present

## 2018-03-15 DIAGNOSIS — E559 Vitamin D deficiency, unspecified: Secondary | ICD-10-CM | POA: Diagnosis not present

## 2018-03-15 DIAGNOSIS — I10 Essential (primary) hypertension: Secondary | ICD-10-CM | POA: Diagnosis not present

## 2018-04-02 ENCOUNTER — Ambulatory Visit (INDEPENDENT_AMBULATORY_CARE_PROVIDER_SITE_OTHER): Payer: Medicare Other | Admitting: Family Medicine

## 2018-04-02 ENCOUNTER — Encounter (INDEPENDENT_AMBULATORY_CARE_PROVIDER_SITE_OTHER): Payer: Medicare Other | Admitting: Ophthalmology

## 2018-04-03 ENCOUNTER — Ambulatory Visit (INDEPENDENT_AMBULATORY_CARE_PROVIDER_SITE_OTHER): Payer: Medicare Other | Admitting: Surgery

## 2018-04-03 ENCOUNTER — Telehealth (INDEPENDENT_AMBULATORY_CARE_PROVIDER_SITE_OTHER): Payer: Self-pay | Admitting: Surgery

## 2018-04-03 NOTE — Telephone Encounter (Signed)
Returned call to patient left message to call back concerning rescheduling an appointment

## 2018-04-05 DIAGNOSIS — I10 Essential (primary) hypertension: Secondary | ICD-10-CM | POA: Diagnosis not present

## 2018-04-05 DIAGNOSIS — M545 Low back pain: Secondary | ICD-10-CM | POA: Diagnosis not present

## 2018-04-05 DIAGNOSIS — S79911A Unspecified injury of right hip, initial encounter: Secondary | ICD-10-CM | POA: Diagnosis not present

## 2018-04-05 DIAGNOSIS — M549 Dorsalgia, unspecified: Secondary | ICD-10-CM | POA: Diagnosis not present

## 2018-04-05 DIAGNOSIS — M25551 Pain in right hip: Secondary | ICD-10-CM | POA: Diagnosis not present

## 2018-04-05 DIAGNOSIS — M79671 Pain in right foot: Secondary | ICD-10-CM | POA: Diagnosis not present

## 2018-04-05 DIAGNOSIS — D649 Anemia, unspecified: Secondary | ICD-10-CM | POA: Diagnosis not present

## 2018-04-05 DIAGNOSIS — S3992XA Unspecified injury of lower back, initial encounter: Secondary | ICD-10-CM | POA: Diagnosis not present

## 2018-04-10 ENCOUNTER — Ambulatory Visit (INDEPENDENT_AMBULATORY_CARE_PROVIDER_SITE_OTHER): Payer: Self-pay

## 2018-04-10 ENCOUNTER — Ambulatory Visit (INDEPENDENT_AMBULATORY_CARE_PROVIDER_SITE_OTHER): Payer: Medicare Other | Admitting: Family Medicine

## 2018-04-10 ENCOUNTER — Encounter (INDEPENDENT_AMBULATORY_CARE_PROVIDER_SITE_OTHER): Payer: Self-pay | Admitting: Family Medicine

## 2018-04-10 DIAGNOSIS — M25551 Pain in right hip: Secondary | ICD-10-CM | POA: Diagnosis not present

## 2018-04-10 NOTE — Progress Notes (Signed)
I saw and examined the patient with Dr. Okey Dupre and agree with assessment and plan as outlined.  Probable aggravation of spinal stenosis with right leg pain and weakness.  Unable to safely drive himself to outpatient PT right now so we will attempt to arrange home health PT.  He has not had very good response to epidural injections in the past but he did respond well to physical therapy.  We were unable to see his recent x-rays and he did not want to have them repeated today.  If he fails to improve we will definitely try to obtain his x-rays for review.

## 2018-04-10 NOTE — Progress Notes (Signed)
  Thomas Vega - 85 y.o. male MRN [id number]  Date of birth: 1926/04/03    SUBJECTIVE:      Chief Complaint:/ HPI:  Thomas Vega is an 85 year old male who presents with right lower extremity pain.  He states his pain began approximately 2 weeks ago after series of falls.  He fell initially on New Year's Eve then again 2 days later.  He reports falling onto the right side.  Since that time, he states his right leg pain has been worsening.  His pain begins in the right buttock region and radiates down the back of his leg to the foot.  He also reports bilateral low back pain but notes that this is a chronic problem.  Since a fall he does feel that he has had some weakness in the right leg.  He denies any numbness or tingling.  Previously he was ambulating with a cane but has since been using a walker.  His pain seems to be worse in the morning and with extension of the lumbar spine.  Improved with flexion.  He also gets relief with alternating Aleve and Tylenol.  He denies any new bowel or bladder symptoms.   ROS:     See HPI  PERTINENT  PMH / PSH FH / / SH:  Past Medical, Surgical, Social, and Family History Reviewed & Updated in the EMR.  Pertinent findings include:  History of spinal stenosis  OBJECTIVE: There were no vitals taken for this visit.  Physical Exam:  Vital signs are reviewed.  GEN: Alert and oriented, NAD Pulm: Breathing unlabored PSY: normal mood, congruent affect  MSK: Lumbar spine: - Inspection: no gross deformity or asymmetry, swelling or ecchymosis - Palpation: Mild bilateral lumbosacral region tenderness.  No focal spinous process tenderness or tenderness over bony landmarks. - ROM: Limited range of motion of the lumbar spine in flexion and extension.  Does note increased pain with extension pain - Strength: On the right 4/5 strength with hip flexion, knee extension, and dorsiflexion of the foot and 5/5 strength with plantarflexion.  5/5 strength with testing of the left  leg. - Neuro: sensation intact in the L4-S1 nerve root distribution b/l - Special testing: Negative straight leg raise bilaterally  Right hip:  - Inspection: No gross deformity, no swelling, erythema, or ecchymosis - Palpation: No TTP, specifically none over greater trochanter - ROM: Normal range of motion on Flexion, extension, abduction, internal and external rotation without pain - Strength: See lumbar exam above - Neuro/vasc: NV intact distally - Special Tests: Negative logroll    ASSESSMENT & PLAN:  1.  Right lower extremity pain- suspect this is due to worsening of his spinal stenosis.  The pain he describes very consistent with neuropathic pain.  Of note, patient did have previous pelvic x-rays performed by his PCP.  These are not available for review in our system.  After discussion with the patient, he declined further imaging at this time.  Based on his exam today, there is little concern for acute fracture and may consider further imaging if his symptoms fail to improve - Referral for home physical therapy - Gabapentin 100 mg nightly -Continue Aleve and Tylenol as needed.  Cautioned as to limiting use of Aleve if possible. - Follow-up 6 weeks as needed

## 2018-04-17 DIAGNOSIS — Z9181 History of falling: Secondary | ICD-10-CM | POA: Diagnosis not present

## 2018-04-17 DIAGNOSIS — Z7982 Long term (current) use of aspirin: Secondary | ICD-10-CM | POA: Diagnosis not present

## 2018-04-17 DIAGNOSIS — M48062 Spinal stenosis, lumbar region with neurogenic claudication: Secondary | ICD-10-CM | POA: Diagnosis not present

## 2018-04-18 ENCOUNTER — Telehealth (INDEPENDENT_AMBULATORY_CARE_PROVIDER_SITE_OTHER): Payer: Self-pay | Admitting: Family Medicine

## 2018-04-18 DIAGNOSIS — I10 Essential (primary) hypertension: Secondary | ICD-10-CM | POA: Diagnosis not present

## 2018-04-18 DIAGNOSIS — R739 Hyperglycemia, unspecified: Secondary | ICD-10-CM | POA: Diagnosis not present

## 2018-04-18 DIAGNOSIS — D649 Anemia, unspecified: Secondary | ICD-10-CM | POA: Diagnosis not present

## 2018-04-18 NOTE — Telephone Encounter (Signed)
Ok to add home health aid.

## 2018-04-18 NOTE — Telephone Encounter (Signed)
I called Thomas Vega and gave verbal authorization for adding both home health aide and OT.

## 2018-04-18 NOTE — Telephone Encounter (Signed)
Thomas Vega with [facility] called needing verbal orders to add a Home Health Aid and OT to the order. The number to contact Magda Paganini is [phone]

## 2018-04-18 NOTE — Telephone Encounter (Signed)
Please advise 

## 2018-04-19 ENCOUNTER — Telehealth (INDEPENDENT_AMBULATORY_CARE_PROVIDER_SITE_OTHER): Payer: Self-pay | Admitting: Specialist

## 2018-04-19 DIAGNOSIS — Z7982 Long term (current) use of aspirin: Secondary | ICD-10-CM | POA: Diagnosis not present

## 2018-04-19 DIAGNOSIS — Z9181 History of falling: Secondary | ICD-10-CM | POA: Diagnosis not present

## 2018-04-19 DIAGNOSIS — M48062 Spinal stenosis, lumbar region with neurogenic claudication: Secondary | ICD-10-CM | POA: Diagnosis not present

## 2018-04-19 NOTE — Telephone Encounter (Signed)
Patient called asked if the X-rays were received by [facility] for him to review. Patient said Dr Julianne Rice office sent them out Tuesday. The number to contact patient is [phone]

## 2018-04-20 NOTE — Telephone Encounter (Signed)
Have not seen them yet---pt has been advised

## 2018-04-23 ENCOUNTER — Telehealth (INDEPENDENT_AMBULATORY_CARE_PROVIDER_SITE_OTHER): Payer: Self-pay | Admitting: Family Medicine

## 2018-04-23 DIAGNOSIS — Z7982 Long term (current) use of aspirin: Secondary | ICD-10-CM | POA: Diagnosis not present

## 2018-04-23 DIAGNOSIS — M48062 Spinal stenosis, lumbar region with neurogenic claudication: Secondary | ICD-10-CM | POA: Diagnosis not present

## 2018-04-23 DIAGNOSIS — Z9181 History of falling: Secondary | ICD-10-CM | POA: Diagnosis not present

## 2018-04-23 NOTE — Telephone Encounter (Signed)
Called patient per voicemail message from Garlan Fair (PT Assistant) with [facility] concerning CD and X-rays of patient's right hip

## 2018-04-24 ENCOUNTER — Telehealth (INDEPENDENT_AMBULATORY_CARE_PROVIDER_SITE_OTHER): Payer: Self-pay | Admitting: Specialist

## 2018-04-24 NOTE — Telephone Encounter (Signed)
Returned call to patient  Left message on voicemail stating CD was received from Dr. Julianne Rice office

## 2018-04-25 DIAGNOSIS — Z7982 Long term (current) use of aspirin: Secondary | ICD-10-CM | POA: Diagnosis not present

## 2018-04-25 DIAGNOSIS — Z9181 History of falling: Secondary | ICD-10-CM | POA: Diagnosis not present

## 2018-04-25 DIAGNOSIS — M48062 Spinal stenosis, lumbar region with neurogenic claudication: Secondary | ICD-10-CM | POA: Diagnosis not present

## 2018-04-26 DIAGNOSIS — Z7982 Long term (current) use of aspirin: Secondary | ICD-10-CM | POA: Diagnosis not present

## 2018-04-26 DIAGNOSIS — M48062 Spinal stenosis, lumbar region with neurogenic claudication: Secondary | ICD-10-CM | POA: Diagnosis not present

## 2018-04-26 DIAGNOSIS — Z9181 History of falling: Secondary | ICD-10-CM | POA: Diagnosis not present

## 2018-04-27 DIAGNOSIS — M48062 Spinal stenosis, lumbar region with neurogenic claudication: Secondary | ICD-10-CM | POA: Diagnosis not present

## 2018-04-27 DIAGNOSIS — Z9181 History of falling: Secondary | ICD-10-CM | POA: Diagnosis not present

## 2018-04-27 DIAGNOSIS — Z7982 Long term (current) use of aspirin: Secondary | ICD-10-CM | POA: Diagnosis not present

## 2018-04-30 DIAGNOSIS — Z9181 History of falling: Secondary | ICD-10-CM | POA: Diagnosis not present

## 2018-04-30 DIAGNOSIS — M48062 Spinal stenosis, lumbar region with neurogenic claudication: Secondary | ICD-10-CM | POA: Diagnosis not present

## 2018-04-30 DIAGNOSIS — Z7982 Long term (current) use of aspirin: Secondary | ICD-10-CM | POA: Diagnosis not present

## 2018-05-01 DIAGNOSIS — Z9181 History of falling: Secondary | ICD-10-CM | POA: Diagnosis not present

## 2018-05-01 DIAGNOSIS — Z7982 Long term (current) use of aspirin: Secondary | ICD-10-CM | POA: Diagnosis not present

## 2018-05-01 DIAGNOSIS — M48062 Spinal stenosis, lumbar region with neurogenic claudication: Secondary | ICD-10-CM | POA: Diagnosis not present

## 2018-05-02 ENCOUNTER — Telehealth (INDEPENDENT_AMBULATORY_CARE_PROVIDER_SITE_OTHER): Payer: Self-pay | Admitting: Specialist

## 2018-05-02 NOTE — Telephone Encounter (Signed)
Patient called asked if the CD was reviewed by Dr Louanne Skye yet? Patient asked when can he schedule an appointment? The number to contact patient is [phone]

## 2018-05-02 NOTE — Telephone Encounter (Signed)
Go ahead and sched appt for next available

## 2018-05-03 DIAGNOSIS — M48062 Spinal stenosis, lumbar region with neurogenic claudication: Secondary | ICD-10-CM | POA: Diagnosis not present

## 2018-05-03 DIAGNOSIS — Z7982 Long term (current) use of aspirin: Secondary | ICD-10-CM | POA: Diagnosis not present

## 2018-05-03 DIAGNOSIS — Z9181 History of falling: Secondary | ICD-10-CM | POA: Diagnosis not present

## 2018-05-03 NOTE — Telephone Encounter (Signed)
Patient is scheduled 05/10/2018 with Jeneen Rinks at 3:15pm

## 2018-05-07 DIAGNOSIS — Z9181 History of falling: Secondary | ICD-10-CM | POA: Diagnosis not present

## 2018-05-07 DIAGNOSIS — M48062 Spinal stenosis, lumbar region with neurogenic claudication: Secondary | ICD-10-CM | POA: Diagnosis not present

## 2018-05-07 DIAGNOSIS — Z7982 Long term (current) use of aspirin: Secondary | ICD-10-CM | POA: Diagnosis not present

## 2018-05-08 DIAGNOSIS — Z9181 History of falling: Secondary | ICD-10-CM | POA: Diagnosis not present

## 2018-05-08 DIAGNOSIS — M48062 Spinal stenosis, lumbar region with neurogenic claudication: Secondary | ICD-10-CM | POA: Diagnosis not present

## 2018-05-08 DIAGNOSIS — Z7982 Long term (current) use of aspirin: Secondary | ICD-10-CM | POA: Diagnosis not present

## 2018-05-09 DIAGNOSIS — Z9181 History of falling: Secondary | ICD-10-CM | POA: Diagnosis not present

## 2018-05-09 DIAGNOSIS — Z7982 Long term (current) use of aspirin: Secondary | ICD-10-CM | POA: Diagnosis not present

## 2018-05-09 DIAGNOSIS — M48062 Spinal stenosis, lumbar region with neurogenic claudication: Secondary | ICD-10-CM | POA: Diagnosis not present

## 2018-05-10 ENCOUNTER — Encounter (INDEPENDENT_AMBULATORY_CARE_PROVIDER_SITE_OTHER): Payer: Self-pay | Admitting: Surgery

## 2018-05-10 ENCOUNTER — Ambulatory Visit (INDEPENDENT_AMBULATORY_CARE_PROVIDER_SITE_OTHER): Payer: Medicare Other | Admitting: Surgery

## 2018-05-10 ENCOUNTER — Telehealth (INDEPENDENT_AMBULATORY_CARE_PROVIDER_SITE_OTHER): Payer: Self-pay

## 2018-05-10 ENCOUNTER — Ambulatory Visit (INDEPENDENT_AMBULATORY_CARE_PROVIDER_SITE_OTHER): Payer: Self-pay

## 2018-05-10 ENCOUNTER — Ambulatory Visit (HOSPITAL_COMMUNITY)
Admission: RE | Admit: 2018-05-10 | Discharge: 2018-05-10 | Disposition: A | Discharge status: Home / Self Care | Payer: Medicare Other | Source: Ambulatory Visit | Attending: Surgery | Admitting: Surgery

## 2018-05-10 DIAGNOSIS — M545 Low back pain: Principal | ICD-10-CM

## 2018-05-10 DIAGNOSIS — M4316 Spondylolisthesis, lumbar region: Secondary | ICD-10-CM

## 2018-05-10 DIAGNOSIS — G8929 Other chronic pain: Secondary | ICD-10-CM

## 2018-05-10 DIAGNOSIS — M7989 Other specified soft tissue disorders: Secondary | ICD-10-CM | POA: Insufficient documentation

## 2018-05-10 NOTE — Progress Notes (Signed)
Right lower extremity venous duplex. Refer to "CV Proc" under chart review to view preliminary results.  05/10/2018 5:07 PM Maudry Mayhew, MHA, RVT, RDCS, RDMS

## 2018-05-10 NOTE — Progress Notes (Signed)
Office Visit Note   Patient: Thomas Vega           Date of Birth: October 28, 1926           MRN: [id number] Visit Date: 05/10/2018              Requested by: Thomas Gravel, MD [address] [place], [place] [address] PCP: Thomas Gravel, MD   Assessment & Plan: Visit Diagnoses:  1. Chronic right-sided low back pain without sciatica   2. Spondylolisthesis of lumbar region   3. Calf swelling     Plan: With patient's worsening lumbar symptoms I will schedule MRI and compared to previous study that was done April 2017.  Patient states that his symptoms have been getting worse since that last study.  He will need to follow-up with Thomas. Louanne Vega after completion to discuss results and further treatment options.  With patient's right calf pain and swelling I sent him for venous Doppler to rule out DVT.  Will await callback report.  Follow-Up Instructions: Return in about 2 weeks (around 05/24/2018) for with Thomas Vega to review mri and doppler. .   Orders:  Orders Placed This Encounter  Procedures  . XR Lumbar Spine 2-3 Views  . MR Lumbar Spine w/o contrast   No orders of the defined types were placed in this encounter.     Procedures: No procedures performed   Clinical Data: No additional findings.   Subjective: Chief Complaint  Patient presents with  . Spine - Follow-up    HPI 85 year old white male comes in with worsening low back pain and right greater than left lower extremity radiculopathy.  Patient has known history of multilevel lumbar stenosis.  Last lumbar MRI performed July 02, 2015 showed:  CLINICAL DATA:  Low back pain when standing.  EXAM: MRI LUMBAR SPINE WITHOUT CONTRAST  TECHNIQUE: Multiplanar, multisequence MR imaging of the lumbar spine was performed. No intravenous contrast was administered.  COMPARISON:  10/27/2011  FINDINGS: Segmentation: Conventional anatomy assistant, with the last open disc space designated L5-S1.  Alignment: The  vertebral bodies of the lumbar spine are normal in alignment.  Bones: The vertebral bodies of the lumbar spine are normal in size. There is normal bone marrow signal demonstrated throughout the vertebra. The visualized portions of the SI joints are unremarkable.  Conus medullaris: Extends to the T12-L1 level and appears normal. The nerve roots of the cauda equina and the filum terminale are normal.  Paraspinal and other soft tissues: There is no focal abnormality. The imaged intra-abdominal contents are unremarkable.  Disc levels:  Disc spaces: There is mild degenerative disc disease with disc height loss at L5-S1.  T12-L1: No significant disc bulge. No evidence of neural foraminal stenosis. No central canal stenosis.  L1-L2: Mild broad-based disc bulge. No evidence of neural foraminal stenosis. No central canal stenosis.  L2-L3: Mild broad-based disc bulge. Severe bilateral facet arthropathy. Severe spinal stenosis. No evidence of neural foraminal stenosis.  L3-L4: Mild broad-based disc bulge. Severe bilateral facet arthropathy and severe spinal stenosis. No evidence of neural foraminal stenosis.  L4-L5: Mild broad-based disc bulge. Severe bilateral facet arthropathy and severe spinal stenosis. No evidence of neural foraminal stenosis.  L5-S1: Mild broad-based disc bulge with a tiny right paracentral disc protrusion. No evidence of neural foraminal stenosis. No central canal stenosis.  IMPRESSION: 1. At L2-3 and L3-4 there are mild broad-based disc bulges with severe bilateral facet arthropathy and severe spinal stenosis. 2. At L4-5 there is a mild  broad-based disc bulge with severe bilateral facet arthropathy and severe spinal stenosis.   Electronically Signed   By: Thomas Vega   On: 07/02/2015 15:18  States that pain radiates down the right greater than left lower extremity.  Describes having neurogenic claudication symptoms.  Thinks that pain  worsened since fall several weeks ago.  Has some pain into the right hip and also swelling right lower extremity.  No complaints of chest pain shortness of breath.  He has had multiple lumbar ESI's without any real improvement and he is requesting another injection today with Thomas. Ernestina Vega.   Review of Systems No current cardiac pulmonary GI GU issues  Objective: Vital Signs: There were no vitals taken for this visit.  Physical Exam HENT:     Head: Normocephalic.  Pulmonary:     Effort: No respiratory distress.  Musculoskeletal:     Comments: Has lumbar paraspinal tenderness.  Negative logroll bilateral hips.  Negative straight leg raise.  2-3+ pretibial edema on the right.  Mild to moderate right calf tenderness.  Skin:    General: Skin is warm and dry.  Neurological:     General: No focal deficit present.     Mental Status: He is alert.  Psychiatric:        Mood and Affect: Mood normal.     Ortho Exam  Specialty Comments:  No specialty comments available.  Imaging: No results found.   PMFS History: Patient Active Problem List   Diagnosis Date Noted  . Chronic left shoulder pain 04/20/2016  . Spinal stenosis 09/02/2015  . Numbness 09/02/2015  . Bradycardia 07/01/2014  . Thrombocytopenia ([place]) 06/30/2014  . CAP (community acquired pneumonia) 06/29/2014  . Hypoxia 06/29/2014  . HTN (hypertension) 06/29/2014  . Prostate cancer ([place]) 06/29/2014  . Hyponatremia 06/29/2014   Past Medical History:  Diagnosis Date  . Abnormal finding of blood chemistry   . Bradycardia 07/01/2014  . CAP (community acquired pneumonia) 06/29/2014  . Heart murmur   . Irregular heart rate   . Melanoma ([place])   . Prostate cancer ([place])   . Thrombocytopenia ([place]) 06/30/2014    Family History  Problem Relation Age of Onset  . Heart Problems Mother   . Stroke Father     Past Surgical History:  Procedure Laterality Date  . BACK SURGERY    . CATARACT EXTRACTION    . KNEE SURGERY    . MELANOMA  EXCISION     Social History   Occupational History  . Occupation: Retired   Tobacco Use  . Smoking status: Never Smoker  . Smokeless tobacco: Never Used  Substance and Sexual Activity  . Alcohol use: No    Alcohol/week: 0.0 standard drinks  . Drug use: No  . Sexual activity: Not on file

## 2018-05-10 NOTE — Telephone Encounter (Signed)
Thomas Vega with [facility] called stating that patient was Negative for DVT right leg.

## 2018-05-10 NOTE — Telephone Encounter (Signed)
Called and left a VM advising patient to call the office back for Doppler results.

## 2018-05-11 ENCOUNTER — Telehealth (INDEPENDENT_AMBULATORY_CARE_PROVIDER_SITE_OTHER): Payer: Self-pay | Admitting: Specialist

## 2018-05-11 ENCOUNTER — Encounter (HOSPITAL_COMMUNITY): Payer: Medicare Other

## 2018-05-11 DIAGNOSIS — M48062 Spinal stenosis, lumbar region with neurogenic claudication: Secondary | ICD-10-CM | POA: Diagnosis not present

## 2018-05-11 DIAGNOSIS — Z9181 History of falling: Secondary | ICD-10-CM | POA: Diagnosis not present

## 2018-05-11 DIAGNOSIS — Z7982 Long term (current) use of aspirin: Secondary | ICD-10-CM | POA: Diagnosis not present

## 2018-05-11 NOTE — Telephone Encounter (Signed)
Called spoke with patient to notify him of his results of his doppler. Per Benjiman Core, PA . His results were neg. Pt stated that someone had called him earlier today to let him know about his results. Pt  Also stated that his has been scheduled for his MRI For 05/19/2018 @ [facility].

## 2018-05-11 NOTE — Telephone Encounter (Signed)
Did you try to call this patient for anything yesterday?

## 2018-05-11 NOTE — Telephone Encounter (Signed)
Patient left a message stating that he missed a call from our office.  I did not see a message in his chart that someone had called him.  [phone].  Thank you.

## 2018-05-11 NOTE — Telephone Encounter (Signed)
Did you try to call patient yesterday afternoon?  Looks like he saw Thomas Vega and was sent for doppler

## 2018-05-11 NOTE — Telephone Encounter (Signed)
No, unless one the ladies up front called him to schedule a follow up

## 2018-05-14 ENCOUNTER — Telehealth (INDEPENDENT_AMBULATORY_CARE_PROVIDER_SITE_OTHER): Payer: Self-pay | Admitting: Physical Medicine and Rehabilitation

## 2018-05-14 DIAGNOSIS — Z9181 History of falling: Secondary | ICD-10-CM | POA: Diagnosis not present

## 2018-05-14 DIAGNOSIS — Z7982 Long term (current) use of aspirin: Secondary | ICD-10-CM | POA: Diagnosis not present

## 2018-05-14 DIAGNOSIS — M48062 Spinal stenosis, lumbar region with neurogenic claudication: Secondary | ICD-10-CM | POA: Diagnosis not present

## 2018-05-15 DIAGNOSIS — M48062 Spinal stenosis, lumbar region with neurogenic claudication: Secondary | ICD-10-CM | POA: Diagnosis not present

## 2018-05-15 DIAGNOSIS — Z7982 Long term (current) use of aspirin: Secondary | ICD-10-CM | POA: Diagnosis not present

## 2018-05-15 DIAGNOSIS — Z9181 History of falling: Secondary | ICD-10-CM | POA: Diagnosis not present

## 2018-05-15 NOTE — Telephone Encounter (Signed)
Left message for patient to call back to discuss/ schedule. 

## 2018-05-15 NOTE — Telephone Encounter (Signed)
Yes ok 

## 2018-05-15 NOTE — Telephone Encounter (Signed)
FYI- patient has an MRI scheduled for 2/22 and an OV with Dr. Louanne Skye on 3/6. I advised him to wait to see what Dr. Louanne Skye recommends for an injection. He is upset that I cannot go ahead and schedule an injection, and I advised him that per previous notes past injections had not really helped and we needed to see what the MRI showed before repeating injections that are not helping. He stated that this injection was for a fall he had. I advised him that we would not do an injection following a fall without seeing him first or seeing results of imaging studies and that his best option is to follow up with Dr. Louanne Skye on 3/6.

## 2018-05-16 ENCOUNTER — Emergency Department (HOSPITAL_COMMUNITY)
Admission: EM | Admit: 2018-05-16 | Discharge: 2018-05-16 | Disposition: A | Discharge status: Home / Self Care | Payer: Medicare Other | Attending: Emergency Medicine | Admitting: Emergency Medicine

## 2018-05-16 ENCOUNTER — Encounter (HOSPITAL_COMMUNITY): Payer: Self-pay

## 2018-05-16 ENCOUNTER — Other Ambulatory Visit: Payer: Self-pay

## 2018-05-16 ENCOUNTER — Emergency Department (HOSPITAL_COMMUNITY): Payer: Medicare Other

## 2018-05-16 DIAGNOSIS — Z79899 Other long term (current) drug therapy: Secondary | ICD-10-CM | POA: Insufficient documentation

## 2018-05-16 DIAGNOSIS — Z7982 Long term (current) use of aspirin: Secondary | ICD-10-CM | POA: Insufficient documentation

## 2018-05-16 DIAGNOSIS — I10 Essential (primary) hypertension: Secondary | ICD-10-CM | POA: Insufficient documentation

## 2018-05-16 DIAGNOSIS — W01198A Fall on same level from slipping, tripping and stumbling with subsequent striking against other object, initial encounter: Secondary | ICD-10-CM | POA: Diagnosis not present

## 2018-05-16 DIAGNOSIS — Y92000 Kitchen of unspecified non-institutional (private) residence as  the place of occurrence of the external cause: Secondary | ICD-10-CM | POA: Insufficient documentation

## 2018-05-16 DIAGNOSIS — R58 Hemorrhage, not elsewhere classified: Secondary | ICD-10-CM | POA: Diagnosis not present

## 2018-05-16 DIAGNOSIS — W19XXXA Unspecified fall, initial encounter: Secondary | ICD-10-CM

## 2018-05-16 DIAGNOSIS — Y999 Unspecified external cause status: Secondary | ICD-10-CM | POA: Insufficient documentation

## 2018-05-16 DIAGNOSIS — S0191XA Laceration without foreign body of unspecified part of head, initial encounter: Secondary | ICD-10-CM | POA: Diagnosis not present

## 2018-05-16 DIAGNOSIS — S0990XA Unspecified injury of head, initial encounter: Secondary | ICD-10-CM | POA: Diagnosis present

## 2018-05-16 DIAGNOSIS — S0181XA Laceration without foreign body of other part of head, initial encounter: Secondary | ICD-10-CM | POA: Diagnosis not present

## 2018-05-16 DIAGNOSIS — Z8546 Personal history of malignant neoplasm of prostate: Secondary | ICD-10-CM | POA: Diagnosis not present

## 2018-05-16 DIAGNOSIS — Y9301 Activity, walking, marching and hiking: Secondary | ICD-10-CM | POA: Diagnosis not present

## 2018-05-16 DIAGNOSIS — S199XXA Unspecified injury of neck, initial encounter: Secondary | ICD-10-CM | POA: Diagnosis not present

## 2018-05-16 DIAGNOSIS — I499 Cardiac arrhythmia, unspecified: Secondary | ICD-10-CM | POA: Diagnosis not present

## 2018-05-16 MED ORDER — LIDOCAINE-EPINEPHRINE (PF) 2 %-1:200000 IJ SOLN
20.0000 mL | Freq: Once | INTRAMUSCULAR | Status: DC
  Filled 2018-05-16: qty 20

## 2018-05-16 NOTE — ED Provider Notes (Signed)
[facility] EMERGENCY DEPARTMENT Provider Note   CSN: [id number] Arrival date & time: 05/16/18  1151    History   Chief Complaint Chief Complaint  Patient presents with  . Fall    HPI Thomas Vega is a 85 y.o. male.     Accidental trip and fall striking the central forehead prior to admission.  No loss of consciousness or neurological deficits.  Verity of injury was moderate.  Palpation makes pain worse.     Past Medical History:  Diagnosis Date  . Abnormal finding of blood chemistry   . Bradycardia 07/01/2014  . CAP (community acquired pneumonia) 06/29/2014  . Heart murmur   . Irregular heart rate   . Melanoma ([place])   . Prostate cancer ([place])   . Thrombocytopenia ([place]) 06/30/2014    Patient Active Problem List   Diagnosis Date Noted  . Chronic left shoulder pain 04/20/2016  . Spinal stenosis 09/02/2015  . Numbness 09/02/2015  . Bradycardia 07/01/2014  . Thrombocytopenia ([place]) 06/30/2014  . CAP (community acquired pneumonia) 06/29/2014  . Hypoxia 06/29/2014  . HTN (hypertension) 06/29/2014  . Prostate cancer ([place]) 06/29/2014  . Hyponatremia 06/29/2014    Past Surgical History:  Procedure Laterality Date  . BACK SURGERY    . CATARACT EXTRACTION    . KNEE SURGERY    . MELANOMA EXCISION          Home Medications    Prior to Admission medications   Medication Sig Start Date End Date Taking? Authorizing Provider  aspirin EC 81 MG tablet Take 81 mg by mouth daily.    [provider]  DOXAZOSIN MESYLATE PO Take by mouth.    [provider]  guaiFENesin-dextromethorphan (ROBITUSSIN DM) 100-10 MG/5ML syrup Take 5 mLs by mouth every 4 (four) hours as needed for cough. 07/01/14   Rexene Alberts, MD  lisinopril (PRINIVIL,ZESTRIL) 5 MG tablet Take 2.5 mg by mouth daily.    [provider]  Multiple Vitamin (MULTIVITAMIN WITH MINERALS) TABS tablet Take 1 tablet by mouth daily.    [provider]  naproxen sodium (ANAPROX) 220 MG  tablet Take 220 mg by mouth 2 (two) times daily with a meal.    [provider]  OVER THE COUNTER MEDICATION Take 1 tablet by mouth daily as needed (antihistamine. Allergies.).     [provider]  sodium chloride (OCEAN) 0.65 % nasal spray Place 2 sprays into the nose 2 (two) times daily as needed for congestion.    [provider]    Family History Family History  Problem Relation Age of Onset  . Heart Problems Mother   . Stroke Father     Social History Social History   Tobacco Use  . Smoking status: Never Smoker  . Smokeless tobacco: Never Used  Substance Use Topics  . Alcohol use: No    Alcohol/week: 0.0 standard drinks  . Drug use: No     Allergies   Patient has no known allergies.   Review of Systems Review of Systems  All other systems reviewed and are negative.    Physical Exam Updated Vital Signs BP (!) 180/106 (BP Location: Right Arm)   Pulse 68   Temp 98.3 F (36.8 C) (Oral)   Resp 18   Ht 6\' 3"  (1.905 m)   Wt 88.5 kg   SpO2 98%   BMI 24.37 kg/m   Physical Exam Vitals signs and nursing note reviewed.  Constitutional:      Appearance: He is well-developed.  HENT:     Head: Normocephalic.     Comments: 5.0 cm curvilinear laceration on central forehead/scalp Eyes:     Conjunctiva/sclera: Conjunctivae normal.  Neck:     Musculoskeletal: Neck supple.  Cardiovascular:     Rate and Rhythm: Normal rate and regular rhythm.  Pulmonary:     Effort: Pulmonary effort is normal.     Breath sounds: Normal breath sounds.  Abdominal:     General: Bowel sounds are normal.     Palpations: Abdomen is soft.  Musculoskeletal: Normal range of motion.  Skin:    General: Skin is warm and dry.  Neurological:     Mental Status: He is alert and oriented to person, place, and time.  Psychiatric:        Behavior: Behavior normal.      ED Treatments / Results  Labs (all labs ordered are listed, but only abnormal results are  displayed) Labs Reviewed - No data to display  EKG None  Radiology Ct Head Wo Contrast  Result Date: 05/16/2018 CLINICAL DATA:  Fall EXAM: CT HEAD WITHOUT CONTRAST CT CERVICAL SPINE WITHOUT CONTRAST TECHNIQUE: Multidetector CT imaging of the head and cervical spine was performed following the standard protocol without intravenous contrast. Multiplanar CT image reconstructions of the cervical spine were also generated. COMPARISON:  None. FINDINGS: CT HEAD FINDINGS Brain: No evidence of acute infarction, hemorrhage, hydrocephalus, extra-axial collection or mass lesion/mass effect. Vascular: No hyperdense vessel or unexpected calcification. Skull: Normal. Negative for fracture or focal lesion. Sinuses/Orbits: No acute finding. Other: Soft tissue laceration of the left forehead. CT CERVICAL SPINE FINDINGS Alignment: Normal. Skull base and vertebrae: No acute fracture. No primary bone lesion or focal pathologic process. Soft tissues and spinal canal: No prevertebral fluid or swelling. No visible canal hematoma. Disc levels: Mild multilevel disc space height loss. Moderate multilevel facet degenerative change. Upper chest: Negative. Other: None. IMPRESSION: 1.  No acute intracranial pathology. 2.  No fracture or static subluxation of the cervical spine. Electronically Signed   By: Eddie Candle M.D.   On: 05/16/2018 13:40   Ct Cervical Spine Wo Contrast  Result Date: 05/16/2018 CLINICAL DATA:  Fall EXAM: CT HEAD WITHOUT CONTRAST CT CERVICAL SPINE WITHOUT CONTRAST TECHNIQUE: Multidetector CT imaging of the head and cervical spine was performed following the standard protocol without intravenous contrast. Multiplanar CT image reconstructions of the cervical spine were also generated. COMPARISON:  None. FINDINGS: CT HEAD FINDINGS Brain: No evidence of acute infarction, hemorrhage, hydrocephalus, extra-axial collection or mass lesion/mass effect. Vascular: No hyperdense vessel or unexpected calcification. Skull:  Normal. Negative for fracture or focal lesion. Sinuses/Orbits: No acute finding. Other: Soft tissue laceration of the left forehead. CT CERVICAL SPINE FINDINGS Alignment: Normal. Skull base and vertebrae: No acute fracture. No primary bone lesion or focal pathologic process. Soft tissues and spinal canal: No prevertebral fluid or swelling. No visible canal hematoma. Disc levels: Mild multilevel disc space height loss. Moderate multilevel facet degenerative change. Upper chest: Negative. Other: None. IMPRESSION: 1.  No acute intracranial pathology. 2.  No fracture or static subluxation of the cervical spine. Electronically Signed   By: Eddie Candle M.D.   On: 05/16/2018 13:40    Procedures .Marland KitchenLaceration Repair Date/Time: 05/16/2018 3:10 PM Performed by: Nat Christen, MD Authorized by: Nat Christen, MD   Consent:    Consent obtained:  Verbal   Consent given by:  Patient   Risks discussed:  Pain and poor wound healing Anesthesia (see MAR for exact dosages):  Anesthesia method:  Local infiltration   Local anesthetic:  Lidocaine 2% WITH epi Laceration details:    Location:  Face   Wound length (cm): 5.0. Repair type:    Repair type:  Complex Pre-procedure details:    Preparation:  Patient was prepped and draped in usual sterile fashion Exploration:    Limited defect created (wound extended): no     Hemostasis achieved with:  Epinephrine   Wound exploration: wound explored through full range of motion     Contaminated: no   Treatment:    Area cleansed with:  Saline   Amount of cleaning:  Standard   Irrigation solution:  Sterile saline   Irrigation method:  Syringe   Visualized foreign bodies/material removed: no     Debridement:  None   Scar revision: no   Subcutaneous repair:    Suture size:  4-0   Suture material:  Vicryl   Number of subcutaneous sutures: 2. Skin repair:    Repair method:  Sutures   Suture size:  3-0   Suture material:  Nylon   Suture technique:  Running  locked   Number of sutures: 16. Approximation:    Approximation:  Loose Post-procedure details:    Dressing:  Antibiotic ointment and bulky dressing   Patient tolerance of procedure:  Tolerated well, no immediate complications   (including critical care time)  Medications Ordered in ED Medications  lidocaine-EPINEPHrine (XYLOCAINE W/EPI) 2 %-1:200000 (PF) injection 20 mL (has no administration in time range)     Initial Impression / Assessment and Plan / ED Course  I have reviewed the triage vital signs and the nursing notes.  Pertinent labs & imaging results that were available during my care of the patient were reviewed by me and considered in my medical decision making (see chart for details).        Accidental trip and fall striking the central forehead/scalp.  CT head and CT cervical spine negative.  2 layer laceration repair per examiner.  No neuro deficits at discharge.  Final Clinical Impressions(s) / ED Diagnoses   Final diagnoses:  Fall, initial encounter  Laceration of head without foreign body, unspecified part of head, initial encounter    ED Discharge Orders    None       Nat Christen, MD 05/16/18 1512

## 2018-05-16 NOTE — Discharge Instructions (Addendum)
Sutures out in approximately 10 days.  Keep initial dressing on for at least 48 hours.  Then can make a simple dressing.  Try to keep clean and dry.

## 2018-05-16 NOTE — Telephone Encounter (Signed)
This is absolutely correct and what you told him.  Typically we would do a repeat injection if there was more than 50% relief with prior injection and no other changes or trauma.  I am not sure that he understands this and I know he is in a great deal of pain and that is why he wants an injection but if he really is getting follow-up with Dr. Louanne Skye and an MRI I think the wisest choice would be to see what that shows before we attempt any other injection that may not help.

## 2018-05-16 NOTE — ED Notes (Signed)
Cresent shape laceration to left forehead. Bleeding controlled. C Collar in place.Pt alert and oriented

## 2018-05-16 NOTE — ED Triage Notes (Signed)
Pt tripped over a rug in kitchen and fell hitting head on counter.  Laceration over left eye.  Bleeding controlled.  Denies any LOC.  Pt in c collar.  Pt c/o r sided back pain and buttocks pain from a fall 1 month ago.

## 2018-05-17 DIAGNOSIS — Z9181 History of falling: Secondary | ICD-10-CM | POA: Diagnosis not present

## 2018-05-17 DIAGNOSIS — M48062 Spinal stenosis, lumbar region with neurogenic claudication: Secondary | ICD-10-CM | POA: Diagnosis not present

## 2018-05-17 DIAGNOSIS — Z7982 Long term (current) use of aspirin: Secondary | ICD-10-CM | POA: Diagnosis not present

## 2018-05-18 DIAGNOSIS — Z9181 History of falling: Secondary | ICD-10-CM | POA: Diagnosis not present

## 2018-05-18 DIAGNOSIS — M48062 Spinal stenosis, lumbar region with neurogenic claudication: Secondary | ICD-10-CM | POA: Diagnosis not present

## 2018-05-18 DIAGNOSIS — Z7982 Long term (current) use of aspirin: Secondary | ICD-10-CM | POA: Diagnosis not present

## 2018-05-19 ENCOUNTER — Ambulatory Visit
Admission: RE | Admit: 2018-05-19 | Discharge: 2018-05-19 | Disposition: A | Discharge status: Home / Self Care | Payer: Medicare Other | Source: Ambulatory Visit | Attending: Surgery | Admitting: Surgery

## 2018-05-19 DIAGNOSIS — S32050A Wedge compression fracture of fifth lumbar vertebra, initial encounter for closed fracture: Secondary | ICD-10-CM | POA: Diagnosis not present

## 2018-05-19 DIAGNOSIS — M4316 Spondylolisthesis, lumbar region: Secondary | ICD-10-CM

## 2018-05-21 DIAGNOSIS — M48062 Spinal stenosis, lumbar region with neurogenic claudication: Secondary | ICD-10-CM | POA: Diagnosis not present

## 2018-05-21 DIAGNOSIS — Z7982 Long term (current) use of aspirin: Secondary | ICD-10-CM | POA: Diagnosis not present

## 2018-05-21 DIAGNOSIS — Z9181 History of falling: Secondary | ICD-10-CM | POA: Diagnosis not present

## 2018-05-22 ENCOUNTER — Telehealth (INDEPENDENT_AMBULATORY_CARE_PROVIDER_SITE_OTHER): Payer: Self-pay | Admitting: Specialist

## 2018-05-22 DIAGNOSIS — Z9181 History of falling: Secondary | ICD-10-CM | POA: Diagnosis not present

## 2018-05-22 DIAGNOSIS — Z7982 Long term (current) use of aspirin: Secondary | ICD-10-CM | POA: Diagnosis not present

## 2018-05-22 DIAGNOSIS — M48062 Spinal stenosis, lumbar region with neurogenic claudication: Secondary | ICD-10-CM | POA: Diagnosis not present

## 2018-05-22 NOTE — Telephone Encounter (Signed)
Patient would like to be placed on the cancellation list if Dr. Louanne Skye has any cancellations before March 6.  CB#[phone].  Thank you.

## 2018-05-22 NOTE — Telephone Encounter (Signed)
I put him on the cancellation list 

## 2018-05-23 DIAGNOSIS — Z7982 Long term (current) use of aspirin: Secondary | ICD-10-CM | POA: Diagnosis not present

## 2018-05-23 DIAGNOSIS — Z9181 History of falling: Secondary | ICD-10-CM | POA: Diagnosis not present

## 2018-05-23 DIAGNOSIS — M48062 Spinal stenosis, lumbar region with neurogenic claudication: Secondary | ICD-10-CM | POA: Diagnosis not present

## 2018-05-24 ENCOUNTER — Encounter (INDEPENDENT_AMBULATORY_CARE_PROVIDER_SITE_OTHER): Payer: Medicare Other | Admitting: Ophthalmology

## 2018-05-24 DIAGNOSIS — M48062 Spinal stenosis, lumbar region with neurogenic claudication: Secondary | ICD-10-CM | POA: Diagnosis not present

## 2018-05-24 DIAGNOSIS — Z7982 Long term (current) use of aspirin: Secondary | ICD-10-CM | POA: Diagnosis not present

## 2018-05-24 DIAGNOSIS — Z9181 History of falling: Secondary | ICD-10-CM | POA: Diagnosis not present

## 2018-05-28 ENCOUNTER — Ambulatory Visit (INDEPENDENT_AMBULATORY_CARE_PROVIDER_SITE_OTHER): Payer: Medicare Other | Admitting: Specialist

## 2018-05-28 ENCOUNTER — Encounter (INDEPENDENT_AMBULATORY_CARE_PROVIDER_SITE_OTHER): Payer: Self-pay | Admitting: Specialist

## 2018-05-28 VITALS — BP 131/47 | HR 76 | Ht 74.0 in | Wt 195.0 lb

## 2018-05-28 DIAGNOSIS — M21371 Foot drop, right foot: Secondary | ICD-10-CM | POA: Diagnosis not present

## 2018-05-28 DIAGNOSIS — Z8781 Personal history of (healed) traumatic fracture: Secondary | ICD-10-CM

## 2018-05-28 DIAGNOSIS — M79604 Pain in right leg: Secondary | ICD-10-CM | POA: Diagnosis not present

## 2018-05-28 DIAGNOSIS — R2689 Other abnormalities of gait and mobility: Secondary | ICD-10-CM

## 2018-05-28 DIAGNOSIS — S32050A Wedge compression fracture of fifth lumbar vertebra, initial encounter for closed fracture: Secondary | ICD-10-CM

## 2018-05-28 DIAGNOSIS — I10 Essential (primary) hypertension: Secondary | ICD-10-CM | POA: Diagnosis not present

## 2018-05-28 DIAGNOSIS — R296 Repeated falls: Secondary | ICD-10-CM | POA: Diagnosis not present

## 2018-05-28 DIAGNOSIS — M48062 Spinal stenosis, lumbar region with neurogenic claudication: Secondary | ICD-10-CM

## 2018-05-28 NOTE — Patient Instructions (Signed)
Plan: Avoid bending, stooping and avoid lifting weights greater than 10 lbs. Avoid prolong standing and walking. Avoid frequent bending and stooping  No lifting greater than 10 lbs. May use ice or moist heat for pain. Weight loss is of benefit. Handicap license is approved. Dr. Romona Curls secretary/Assistant will call to arrange for epidural steroid injection   Home health for PT to work on balance and coordination, assess for a lift chair. Fall Prevention and Home Safety Falls cause injuries and can affect all age groups. It is possible to use preventive measures to significantly decrease the likelihood of falls. There are many simple measures which can make your home safer and prevent falls. OUTDOORS  Repair cracks and edges of walkways and driveways.  Remove high doorway thresholds.  Trim shrubbery on the main path into your home.  Have good outside lighting.  Clear walkways of tools, rocks, debris, and clutter.  Check that handrails are not broken and are securely fastened. Both sides of steps should have handrails.  Have leaves, snow, and ice cleared regularly.  Use sand or salt on walkways during winter months.  In the garage, clean up grease or oil spills. BATHROOM  Install night lights.  Install grab bars by the toilet and in the tub and shower.  Use non-skid mats or decals in the tub or shower.  Place a plastic non-slip stool in the shower to sit on, if needed.  Keep floors dry and clean up all water on the floor immediately.  Remove soap buildup in the tub or shower on a regular basis.  Secure bath mats with non-slip, double-sided rug tape.  Remove throw rugs and tripping hazards from the floors. BEDROOMS  Install night lights.  Make sure a bedside light is easy to reach.  Do not use oversized bedding.  Keep a telephone by your bedside.  Have a firm chair with side arms to use for getting dressed.  Remove throw rugs and tripping hazards from the  floor. KITCHEN  Keep handles on pots and pans turned toward the center of the stove. Use back burners when possible.  Clean up spills quickly and allow time for drying.  Avoid walking on wet floors.  Avoid hot utensils and knives.  Position shelves so they are not too high or low.  Place commonly used objects within easy reach.  If necessary, use a sturdy step stool with a grab bar when reaching.  Keep electrical cables out of the way.  Do not use floor polish or wax that makes floors slippery. If you must use wax, use non-skid floor wax.  Remove throw rugs and tripping hazards from the floor. STAIRWAYS  Never leave objects on stairs.  Place handrails on both sides of stairways and use them. Fix any loose handrails. Make sure handrails on both sides of the stairways are as long as the stairs.  Check carpeting to make sure it is firmly attached along stairs. Make repairs to worn or loose carpet promptly.  Avoid placing throw rugs at the top or bottom of stairways, or properly secure the rug with carpet tape to prevent slippage. Get rid of throw rugs, if possible.  Have an electrician put in a light switch at the top and bottom of the stairs. OTHER FALL PREVENTION TIPS  Wear low-heel or rubber-soled shoes that are supportive and fit well. Wear closed toe shoes.  When using a stepladder, make sure it is fully opened and both spreaders are firmly locked. Do not climb a  closed stepladder.  Add color or contrast paint or tape to grab bars and handrails in your home. Place contrasting color strips on first and last steps.  Learn and use mobility aids as needed. Install an electrical emergency response system.  Turn on lights to avoid dark areas. Replace light bulbs that burn out immediately. Get light switches that glow.  Arrange furniture to create clear pathways. Keep furniture in the same place.  Firmly attach carpet with non-skid or double-sided tape.  Eliminate uneven  floor surfaces.  Select a carpet pattern that does not visually hide the edge of steps.  Be aware of all pets. OTHER HOME SAFETY TIPS  Set the water temperature for 120 F (48.8 C).  Keep emergency numbers on or near the telephone.  Keep smoke detectors on every level of the home and near sleeping areas. Document Released: 03/04/2002 Document Revised: 09/13/2011 Document Reviewed: 06/03/2011 [facility] Patient Information 2014 [place].

## 2018-05-28 NOTE — Progress Notes (Signed)
Office Visit Note   Patient: Thomas Vega           Date of Birth: 22-Jan-1927           MRN: [id number] Visit Date: 05/28/2018              Requested by: Jani Gravel, [address] [place], [place] [address] PCP: Jani Gravel, MD   Assessment & Plan: Visit Diagnoses:  1. Spinal stenosis of lumbar region with neurogenic claudication   2. Foot drop, right   3. Closed compression fracture of L5 lumbar vertebra, initial encounter (HCC)   4. Balance disorder     Plan: Avoid bending, stooping and avoid lifting weights greater than 10 lbs. Avoid prolong standing and walking. Avoid frequent bending and stooping  No lifting greater than 10 lbs. May use ice or moist heat for pain. Weight loss is of benefit. Handicap license is approved. Dr. Romona Curls secretary/Assistant will call to arrange for epidural steroid injection  Home health for PT to work on balance and coordination, assess for a lift chair. Fall Prevention and Home Safety Falls cause injuries and can affect all age groups. It is possible to use preventive measures to significantly decrease the likelihood of falls. There are many simple measures which can make your home safer and prevent falls. OUTDOORS  Repair cracks and edges of walkways and driveways.  Remove high doorway thresholds.  Trim shrubbery on the main path into your home.  Have good outside lighting.  Clear walkways of tools, rocks, debris, and clutter.  Check that handrails are not broken and are securely fastened. Both sides of steps should have handrails.  Have leaves, snow, and ice cleared regularly.  Use sand or salt on walkways during winter months.  In the garage, clean up grease or oil spills. BATHROOM  Install night lights.  Install grab bars by the toilet and in the tub and shower.  Use non-skid mats or decals in the tub or shower.  Place a plastic non-slip stool in the shower to sit on, if needed.  Keep floors dry and  clean up all water on the floor immediately.  Remove soap buildup in the tub or shower on a regular basis.  Secure bath mats with non-slip, double-sided rug tape.  Remove throw rugs and tripping hazards from the floors. BEDROOMS  Install night lights.  Make sure a bedside light is easy to reach.  Do not use oversized bedding.  Keep a telephone by your bedside.  Have a firm chair with side arms to use for getting dressed.  Remove throw rugs and tripping hazards from the floor. KITCHEN  Keep handles on pots and pans turned toward the center of the stove. Use back burners when possible.  Clean up spills quickly and allow time for drying.  Avoid walking on wet floors.  Avoid hot utensils and knives.  Position shelves so they are not too high or low.  Place commonly used objects within easy reach.  If necessary, use a sturdy step stool with a grab bar when reaching.  Keep electrical cables out of the way.  Do not use floor polish or wax that makes floors slippery. If you must use wax, use non-skid floor wax.  Remove throw rugs and tripping hazards from the floor. STAIRWAYS  Never leave objects on stairs.  Place handrails on both sides of stairways and use them. Fix any loose handrails. Make sure handrails on both sides of the stairways are as  long as the stairs.  Check carpeting to make sure it is firmly attached along stairs. Make repairs to worn or loose carpet promptly.  Avoid placing throw rugs at the top or bottom of stairways, or properly secure the rug with carpet tape to prevent slippage. Get rid of throw rugs, if possible.  Have an electrician put in a light switch at the top and bottom of the stairs. OTHER FALL PREVENTION TIPS  Wear low-heel or rubber-soled shoes that are supportive and fit well. Wear closed toe shoes.  When using a stepladder, make sure it is fully opened and both spreaders are firmly locked. Do not climb a closed stepladder.  Add color  or contrast paint or tape to grab bars and handrails in your home. Place contrasting color strips on first and last steps.  Learn and use mobility aids as needed. Install an electrical emergency response system.  Turn on lights to avoid dark areas. Replace light bulbs that burn out immediately. Get light switches that glow.  Arrange furniture to create clear pathways. Keep furniture in the same place.  Firmly attach carpet with non-skid or double-sided tape.  Eliminate uneven floor surfaces.  Select a carpet pattern that does not visually hide the edge of steps.  Be aware of all pets. OTHER HOME SAFETY TIPS  Set the water temperature for 120 F (48.8 C).  Keep emergency numbers on or near the telephone.  Keep smoke detectors on every level of the home and near sleeping areas. Document Released: 03/04/2002 Document Revised: 09/13/2011 Document Reviewed: 06/03/2011 [facility] Patient Information 2014 [place].  Follow-Up Instructions: Return in about 4 weeks (around 06/25/2018).   Orders:  Orders Placed This Encounter  Procedures  . Ambulatory referral to Physical Medicine Rehab  . Ambulatory referral to Home Health   No orders of the defined types were placed in this encounter.     Procedures: No procedures performed   Clinical Data: No additional findings.   Subjective: Chief Complaint  Patient presents with  . Lower Back - Follow-up    MRI Review    85 year old male with history of lumbar spinal stenosis that is severe. He has been falling and has fallen at least 5 times in the past one month. He had a 89th year birthday last week. The last fall required 17 stitches in the forehead and bruising. He is having pain right greater trochanter down the right leg into the right foot. He hit hard on the right side on his rear end. He is using tiger balm to decrease pain. He had ESI in the past, the last injection was this past August. He has severe  Lumbar spinal  stenosis, also complains of numbness in his fingertips and his legs. No diabetes Has peripheral neuropathy. Has seen a neurologist, recommended using a walker. Has prostate  Cancer and takes doxyzocine. Taking 5 mg BID to help with prostate. No bowel issues, occasional constipation. Can not walk very far because he tires out.    Review of Systems  Constitutional: Negative.   HENT: Negative.   Eyes: Negative.   Respiratory: Negative.   Cardiovascular: Negative.   Gastrointestinal: Negative.   Endocrine: Negative.   Genitourinary: Negative.   Musculoskeletal: Negative.   Skin: Negative.   Allergic/Immunologic: Negative.   Neurological: Negative.   Hematological: Negative.   Psychiatric/Behavioral: Negative.      Objective: Vital Signs: BP (!) 131/47 (BP Location: Left Arm, Patient Position: Sitting)   Pulse 76   Ht  6\' 2"  (1.88 m)   Wt 195 lb (88.5 kg)   BMI 25.04 kg/m   Physical Exam Constitutional:      Appearance: He is well-developed.  HENT:     Head: Normocephalic and atraumatic.  Eyes:     Pupils: Pupils are equal, round, and reactive to light.  Neck:     Musculoskeletal: Normal range of motion and neck supple.  Pulmonary:     Effort: Pulmonary effort is normal.     Breath sounds: Normal breath sounds.  Abdominal:     General: Bowel sounds are normal.     Palpations: Abdomen is soft.  Skin:    General: Skin is warm and dry.  Neurological:     Mental Status: He is alert and oriented to person, place, and time.  Psychiatric:        Behavior: Behavior normal.        Thought Content: Thought content normal.        Judgment: Judgment normal.     Back Exam   Tenderness  The patient is experiencing tenderness in the lumbar.  Range of Motion  Extension:  20 abnormal  Flexion: 0  Lateral bend right: abnormal  Lateral bend left: abnormal  Rotation right: abnormal  Rotation left: abnormal   Muscle Strength  Right Quadriceps:  5/5  Left Quadriceps:  5/5    Right Hamstrings:  5/5  Left Hamstrings:  5/5   Tests  Straight leg raise right: negative Straight leg raise left: negative  Reflexes  Patellar:  1/4 normal Achilles: abnormal Babinski's sign: normal   Other  Toe walk: abnormal Heel walk: abnormal Sensation: decreased Gait: varus thrust  Erythema: no back redness Scars: absent  Comments:  Right foot DF 3/5, left 5/5 knee extension strength is normal. Left leg normal strength.       Specialty Comments:  No specialty comments available.  Imaging: No results found.   PMFS History: Patient Active Problem List   Diagnosis Date Noted  . Chronic left shoulder pain 04/20/2016  . Spinal stenosis 09/02/2015  . Numbness 09/02/2015  . Bradycardia 07/01/2014  . Thrombocytopenia ([place]) 06/30/2014  . CAP (community acquired pneumonia) 06/29/2014  . Hypoxia 06/29/2014  . HTN (hypertension) 06/29/2014  . Prostate cancer ([place]) 06/29/2014  . Hyponatremia 06/29/2014   Past Medical History:  Diagnosis Date  . Abnormal finding of blood chemistry   . Bradycardia 07/01/2014  . CAP (community acquired pneumonia) 06/29/2014  . Heart murmur   . Irregular heart rate   . Melanoma ([place])   . Prostate cancer ([place])   . Thrombocytopenia ([place]) 06/30/2014    Family History  Problem Relation Age of Onset  . Heart Problems Mother   . Stroke Father     Past Surgical History:  Procedure Laterality Date  . BACK SURGERY    . CATARACT EXTRACTION    . KNEE SURGERY    . MELANOMA EXCISION     Social History   Occupational History  . Occupation: Retired   Tobacco Use  . Smoking status: Never Smoker  . Smokeless tobacco: Never Used  Substance and Sexual Activity  . Alcohol use: No    Alcohol/week: 0.0 standard drinks  . Drug use: No  . Sexual activity: Not on file

## 2018-05-29 DIAGNOSIS — Z7982 Long term (current) use of aspirin: Secondary | ICD-10-CM | POA: Diagnosis not present

## 2018-05-29 DIAGNOSIS — Z9181 History of falling: Secondary | ICD-10-CM | POA: Diagnosis not present

## 2018-05-29 DIAGNOSIS — M48062 Spinal stenosis, lumbar region with neurogenic claudication: Secondary | ICD-10-CM | POA: Diagnosis not present

## 2018-05-31 DIAGNOSIS — Z9181 History of falling: Secondary | ICD-10-CM | POA: Diagnosis not present

## 2018-05-31 DIAGNOSIS — Z7982 Long term (current) use of aspirin: Secondary | ICD-10-CM | POA: Diagnosis not present

## 2018-05-31 DIAGNOSIS — M48062 Spinal stenosis, lumbar region with neurogenic claudication: Secondary | ICD-10-CM | POA: Diagnosis not present

## 2018-06-01 ENCOUNTER — Ambulatory Visit (INDEPENDENT_AMBULATORY_CARE_PROVIDER_SITE_OTHER): Payer: Medicare Other | Admitting: Specialist

## 2018-06-04 ENCOUNTER — Encounter (INDEPENDENT_AMBULATORY_CARE_PROVIDER_SITE_OTHER): Payer: Self-pay | Admitting: Physical Medicine and Rehabilitation

## 2018-06-04 ENCOUNTER — Ambulatory Visit (INDEPENDENT_AMBULATORY_CARE_PROVIDER_SITE_OTHER): Payer: Medicare Other | Admitting: Physical Medicine and Rehabilitation

## 2018-06-04 ENCOUNTER — Ambulatory Visit (INDEPENDENT_AMBULATORY_CARE_PROVIDER_SITE_OTHER): Payer: Self-pay

## 2018-06-04 VITALS — BP 142/74 | HR 52 | Temp 97.8°F

## 2018-06-04 DIAGNOSIS — M5416 Radiculopathy, lumbar region: Secondary | ICD-10-CM | POA: Diagnosis not present

## 2018-06-04 DIAGNOSIS — M48062 Spinal stenosis, lumbar region with neurogenic claudication: Secondary | ICD-10-CM

## 2018-06-04 DIAGNOSIS — Z7982 Long term (current) use of aspirin: Secondary | ICD-10-CM | POA: Diagnosis not present

## 2018-06-04 DIAGNOSIS — Z9181 History of falling: Secondary | ICD-10-CM | POA: Diagnosis not present

## 2018-06-04 MED ORDER — BETAMETHASONE SOD PHOS & ACET 6 (3-3) MG/ML IJ SUSP: 12.0000 mg | Freq: Once | INTRAMUSCULAR | Status: DC

## 2018-06-04 NOTE — Procedures (Signed)
Lumbosacral Transforaminal Epidural Steroid Injection - Sub-Pedicular Approach with Fluoroscopic Guidance  Patient: Thomas Vega      Date of Birth: [birth year] MRN: [id number] PCP: Jani Gravel, MD      Visit Date: 06/04/2018   Universal Protocol:    Date/Time: 06/04/2018  Consent Given By: the patient  Position: PRONE  Additional Comments: Vital signs were monitored before and after the procedure. Patient was prepped and draped in the usual sterile fashion. The correct patient, procedure, and site was verified.   Injection Procedure Details:  Procedure Site One Meds Administered:  Meds ordered this encounter  Medications  . betamethasone acetate-betamethasone sodium phosphate (CELESTONE) injection 12 mg    Laterality: Right  Location/Site:  L4-L5  Needle size: 22 G  Needle type: Spinal  Needle Placement: Transforaminal  Findings:    -Comments: Excellent flow of contrast along the nerve and into the epidural space.  Procedure Details: After squaring off the end-plates to get a true AP view, the C-arm was positioned so that an oblique view of the foramen as noted above was visualized. The target area is just inferior to the "nose of the scotty dog" or sub pedicular. The soft tissues overlying this structure were infiltrated with 2-3 ml. of 1% Lidocaine without Epinephrine.  The spinal needle was inserted toward the target using a "trajectory" view along the fluoroscope beam.  Under AP and lateral visualization, the needle was advanced so it did not puncture dura and was located close the 6 O'Clock position of the pedical in AP tracterory. Biplanar projections were used to confirm position. Aspiration was confirmed to be negative for CSF and/or blood. A 1-2 ml. volume of Isovue-250 was injected and flow of contrast was noted at each level. Radiographs were obtained for documentation purposes.   After attaining the desired flow of contrast documented above, a 0.5 to 1.0  ml test dose of 0.25% Marcaine was injected into each respective transforaminal space.  The patient was observed for 90 seconds post injection.  After no sensory deficits were reported, and normal lower extremity motor function was noted,   the above injectate was administered so that equal amounts of the injectate were placed at each foramen (level) into the transforaminal epidural space.   Additional Comments:  The patient tolerated the procedure well Dressing: 2 x 2 sterile gauze and Band-Aid    Post-procedure details: Patient was observed during the procedure. Post-procedure instructions were reviewed.  Patient left the clinic in stable condition.

## 2018-06-04 NOTE — Progress Notes (Signed)
 .  Numeric Pain Rating Scale and Functional Assessment Average Pain 9   In the last MONTH (on 0-10 scale) has pain interfered with the following?  1. General activity like being  able to carry out your everyday physical activities such as walking, climbing stairs, carrying groceries, or moving a chair?  Rating(5)   +Driver, -BT, -Dye Allergies.  

## 2018-06-04 NOTE — Progress Notes (Signed)
Thomas Vega - 85 y.o. male MRN [id number]  Date of birth: 03-Sep-1926  Office Visit Note: Visit Date: 06/04/2018 PCP: Jani Gravel, MD Referred by: Jani Gravel, MD  Subjective: Chief Complaint  Patient presents with  . Lower Back - Pain  . Right Leg - Pain   HPI: Thomas Vega is a 85 y.o. male who comes in today At the request of Dr. Basil Dess for right L4 transforaminal epidural steroid injection.  Patient has known lumbar stenosis quite severe at L4-5 and L3-4.  Since I have seen him last he has had a couple falls particularly in February with 40% compression fracture anteriorly at L5 without retropulsion.  This is acute on subacute findings with MRI.  He is having a lot of back pain from that but radicular pain probably from the stenosis.  ROS Otherwise per HPI.  Assessment & Plan: Visit Diagnoses:  1. Lumbar radiculopathy   2. Spinal stenosis of lumbar region with neurogenic claudication     Plan: No additional findings.   Meds & Orders:  Meds ordered this encounter  Medications  . betamethasone acetate-betamethasone sodium phosphate (CELESTONE) injection 12 mg    Orders Placed This Encounter  Procedures  . XR C-ARM NO REPORT  . Epidural Steroid injection    Follow-up: Return if symptoms worsen or fail to improve.   Procedures: No procedures performed  Lumbosacral Transforaminal Epidural Steroid Injection - Sub-Pedicular Approach with Fluoroscopic Guidance  Patient: [name]      Date of Birth: [birth year] MRN: [id number] PCP: Jani Gravel, MD      Visit Date: 06/04/2018   Universal Protocol:    Date/Time: 06/04/2018  Consent Given By: the patient  Position: PRONE  Additional Comments: Vital signs were monitored before and after the procedure. Patient was prepped and draped in the usual sterile fashion. The correct patient, procedure, and site was verified.   Injection Procedure Details:  Procedure Site One Meds Administered:  Meds ordered  this encounter  Medications  . betamethasone acetate-betamethasone sodium phosphate (CELESTONE) injection 12 mg    Laterality: Right  Location/Site:  L4-L5  Needle size: 22 G  Needle type: Spinal  Needle Placement: Transforaminal  Findings:    -Comments: Excellent flow of contrast along the nerve and into the epidural space.  Procedure Details: After squaring off the end-plates to get a true AP view, the C-arm was positioned so that an oblique view of the foramen as noted above was visualized. The target area is just inferior to the "nose of the scotty dog" or sub pedicular. The soft tissues overlying this structure were infiltrated with 2-3 ml. of 1% Lidocaine without Epinephrine.  The spinal needle was inserted toward the target using a "trajectory" view along the fluoroscope beam.  Under AP and lateral visualization, the needle was advanced so it did not puncture dura and was located close the 6 O'Clock position of the pedical in AP tracterory. Biplanar projections were used to confirm position. Aspiration was confirmed to be negative for CSF and/or blood. A 1-2 ml. volume of Isovue-250 was injected and flow of contrast was noted at each level. Radiographs were obtained for documentation purposes.   After attaining the desired flow of contrast documented above, a 0.5 to 1.0 ml test dose of 0.25% Marcaine was injected into each respective transforaminal space.  The patient was observed for 90 seconds post injection.  After no sensory deficits were reported, and normal lower extremity motor function was noted,   the above injectate  was administered so that equal amounts of the injectate were placed at each foramen (level) into the transforaminal epidural space.   Additional Comments:  The patient tolerated the procedure well Dressing: 2 x 2 sterile gauze and Band-Aid    Post-procedure details: Patient was observed during the procedure. Post-procedure instructions were  reviewed.  Patient left the clinic in stable condition.    Clinical History: MRI LUMBAR SPINE WITHOUT CONTRAST  TECHNIQUE: Multiplanar, multisequence MR imaging of the lumbar spine was performed. No intravenous contrast was administered.  COMPARISON:  Plain films lumbar spine 04/05/2018. MRI lumbar spine 07/02/2015.  FINDINGS: Segmentation:  Standard.  Alignment: 0.4 cm facet mediated anterolisthesis L4 on L5 is unchanged.  Vertebrae: A superior endplate compression fracture of L5 is seen with vertebral body height loss of approximately 40% anteriorly. There is marrow edema within the vertebral body consistent with acute or subacute injury. No bony retropulsion or involvement of the posterior elements. No other acute abnormality. Remote superior endplate compression fracture versus Schmorl's in L1 is unchanged. 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  Conus medullaris and cauda equina: Conus extends to the L1 level. Conus and cauda equina appear normal.  Paraspinal and other soft tissues: Negative.  Disc levels:  T11-12 is imaged in the sagittal plane only. Disc bulge appears to efface the ventral thecal sac and is more prominent than on the prior MRI.  T12-L1: Mild facet degenerative change.  Otherwise negative.  L1-2: Mild-to-moderate facet arthropathy and a shallow disc bulge. No stenosis.  L2-3: Bulky ligamentum flavum thickening, shallow disc bulge and moderate facet arthropathy. Severe central canal stenosis is unchanged. The foramina are open.  L3-4: Bulky ligamentum flavum thickening, advanced facet arthropathy and a shallow disc bulge cause unchanged severe central canal stenosis. The left foramen is open. Mild right foraminal narrowing noted. The appearance is unchanged.  L4-5: Advanced facet arthropathy, disc bulge and bulky ligamentum flavum thickening cause severe central canal stenosis. The  foramina are open.  L5-S1: Status post right laminotomy. There is a disc bulge with some endplate spur. The central canal is open. The foramina are also patent.  IMPRESSION: Acute or subacute anterior, superior endplate compression fracture of L5 with vertebral body height loss approximately 40%. No other acute abnormality or change since the prior MRI.  Congenitally narrow central canal.  No change in severe congenital and acquired central canal stenosis L2-3, L3-4 and L4-5.  Status post right laminotomy at L5-S1. The central canal and foramina are open.   Electronically Signed   By: Inge Rise M.D.   On: 05/19/2018 15:53   He reports that he has never smoked. He has never used smokeless tobacco. No results for input(s): HGBA1C, LABURIC in the last 8760 hours.  Objective:  VS:  HT:    WT:   BMI:     BP:(!) 142/74  HR:(!) 52bpm  TEMP:97.8 F (36.6 C)(Oral)  RESP:  Physical Exam  Ortho Exam Imaging: Xr C-arm No Report  Result Date: 06/04/2018 Please see Notes tab for imaging impression.   Past Medical/Family/Surgical/Social History: Medications & Allergies reviewed per EMR, new medications updated. Patient Active Problem List   Diagnosis Date Noted  . Chronic left shoulder pain 04/20/2016  . Spinal stenosis 09/02/2015  . Numbness 09/02/2015  . Bradycardia 07/01/2014  . Thrombocytopenia ([place]) 06/30/2014  . CAP (community acquired pneumonia) 06/29/2014  . Hypoxia 06/29/2014  . HTN (hypertension) 06/29/2014  . Prostate cancer ([place]) 06/29/2014  . Hyponatremia 06/29/2014  Past Medical History:  Diagnosis Date  . Abnormal finding of blood chemistry   . Bradycardia 07/01/2014  . CAP (community acquired pneumonia) 06/29/2014  . Heart murmur   . Irregular heart rate   . Melanoma ([place])   . Prostate cancer ([place])   . Thrombocytopenia ([place]) 06/30/2014   Family History  Problem Relation Age of Onset  . Heart Problems Mother   . Stroke Father    Past  Surgical History:  Procedure Laterality Date  . BACK SURGERY    . CATARACT EXTRACTION    . KNEE SURGERY    . MELANOMA EXCISION     Social History   Occupational History  . Occupation: Retired   Tobacco Use  . Smoking status: Never Smoker  . Smokeless tobacco: Never Used  Substance and Sexual Activity  . Alcohol use: No    Alcohol/week: 0.0 standard drinks  . Drug use: No  . Sexual activity: Not on file

## 2018-06-05 DIAGNOSIS — Z9181 History of falling: Secondary | ICD-10-CM | POA: Diagnosis not present

## 2018-06-05 DIAGNOSIS — Z7982 Long term (current) use of aspirin: Secondary | ICD-10-CM | POA: Diagnosis not present

## 2018-06-05 DIAGNOSIS — M48062 Spinal stenosis, lumbar region with neurogenic claudication: Secondary | ICD-10-CM | POA: Diagnosis not present

## 2018-06-06 DIAGNOSIS — M48062 Spinal stenosis, lumbar region with neurogenic claudication: Secondary | ICD-10-CM | POA: Diagnosis not present

## 2018-06-06 DIAGNOSIS — Z9181 History of falling: Secondary | ICD-10-CM | POA: Diagnosis not present

## 2018-06-06 DIAGNOSIS — Z7982 Long term (current) use of aspirin: Secondary | ICD-10-CM | POA: Diagnosis not present

## 2018-06-28 ENCOUNTER — Ambulatory Visit (INDEPENDENT_AMBULATORY_CARE_PROVIDER_SITE_OTHER): Payer: Medicare Other | Admitting: Specialist

## 2018-06-29 ENCOUNTER — Ambulatory Visit (INDEPENDENT_AMBULATORY_CARE_PROVIDER_SITE_OTHER): Payer: Medicare Other | Admitting: Specialist

## 2018-07-30 ENCOUNTER — Encounter: Payer: Self-pay | Admitting: Specialist

## 2018-07-30 ENCOUNTER — Other Ambulatory Visit: Payer: Self-pay

## 2018-07-30 ENCOUNTER — Ambulatory Visit (INDEPENDENT_AMBULATORY_CARE_PROVIDER_SITE_OTHER): Payer: Medicare Other | Admitting: Specialist

## 2018-07-30 ENCOUNTER — Ambulatory Visit (INDEPENDENT_AMBULATORY_CARE_PROVIDER_SITE_OTHER): Payer: Medicare Other

## 2018-07-30 VITALS — BP 134/58 | HR 57 | Ht 74.0 in | Wt 195.0 lb

## 2018-07-30 DIAGNOSIS — S32050D Wedge compression fracture of fifth lumbar vertebra, subsequent encounter for fracture with routine healing: Secondary | ICD-10-CM

## 2018-07-30 DIAGNOSIS — M4316 Spondylolisthesis, lumbar region: Secondary | ICD-10-CM | POA: Diagnosis not present

## 2018-07-30 DIAGNOSIS — M48062 Spinal stenosis, lumbar region with neurogenic claudication: Secondary | ICD-10-CM

## 2018-07-30 MED ORDER — ALENDRONATE SODIUM 70 MG PO TABS: ORAL_TABLET | ORAL | 4 refills | Status: AC

## 2018-07-30 MED ORDER — GABAPENTIN 100 MG PO CAPS: 100.0000 mg | ORAL_CAPSULE | Freq: Every day | ORAL | 4 refills | Status: AC

## 2018-07-30 NOTE — Progress Notes (Addendum)
Office Visit Note   Patient: Thomas Vega           Date of Birth: 28-Sep-1926           MRN: [id number] Visit Date: 07/30/2018              Requested by: Jani Gravel, [address], [place] [address] PCP: Jani Gravel, MD   Assessment & Plan: Visit Diagnoses:  1. Spinal stenosis of lumbar region with neurogenic claudication   2. Closed compression fracture of L5 lumbar vertebra with routine healing, subsequent encounter   3. Spondylolisthesis, lumbar region     Plan: Avoid bending, stooping and avoid lifting weights greater than 10 lbs. Avoid prolong standing and walking. Avoid frequent bending and stooping  No lifting greater than 10 lbs. May use ice or moist heat for pain. Weight loss is of benefit. Handicap license is approved. Dr. Romona Curls secretary/Assistant will call to arrange for epidural steroid injection  Will start fosamax for likely osteoporosis. Start gabapentin for neurogenic pain associated with peripheral neuropathy and lumbar radiculopathy.  Needs wheel chair for long distance greater than 116feet. Light weight wheel chair. Follow-Up Instructions: Return in about 4 weeks (around 08/27/2018).   Orders:  Orders Placed This Encounter  Procedures  . XR Lumbar Spine Complete W/Bend  . Ambulatory referral to Physical Medicine Rehab   Meds ordered this encounter  Medications  . alendronate (FOSAMAX) 70 MG tablet    Sig: Take with a full glass of water on an empty stomach.    Dispense:  12 tablet    Refill:  4  . gabapentin (NEURONTIN) 100 MG capsule    Sig: Take 1 capsule (100 mg total) by mouth at bedtime.    Dispense:  90 capsule    Refill:  4      Procedures: No procedures performed   Clinical Data: No additional findings.   Subjective: Chief Complaint  Patient presents with  . Lower Back - Follow-up    85 year old male with history of severe lumbar spinal stenosis. He had a severe fall and hit his head vs the sink  cabinets and had a laceration of the left mid forehead. He is weak with standing and walking. She is using a walker and has care givers 16 hours per day.  His girl friend is in hospice with a cardiac condition.   Review of Systems  Constitutional: Negative.  Negative for activity change, appetite change, chills, diaphoresis, fatigue, fever and unexpected weight change.  HENT: Negative.  Negative for congestion, dental problem, drooling, ear discharge, ear pain, facial swelling, hearing loss, mouth sores, nosebleeds, postnasal drip, rhinorrhea, sinus pressure, sinus pain, sneezing, sore throat, tinnitus, trouble swallowing and voice change.   Eyes: Negative.  Negative for photophobia, pain, discharge, redness, itching and visual disturbance.  Respiratory: Negative.  Negative for apnea, cough, choking, chest tightness, shortness of breath, wheezing and stridor.   Cardiovascular: Negative.  Negative for chest pain, palpitations and leg swelling.  Gastrointestinal: Negative.  Negative for abdominal distention, abdominal pain, anal bleeding, blood in stool, constipation, diarrhea, nausea and rectal pain.  Endocrine: Negative.  Negative for cold intolerance, heat intolerance, polydipsia, polyphagia and polyuria.  Genitourinary: Negative.  Negative for difficulty urinating, discharge, dysuria, enuresis, flank pain, frequency, genital sores, hematuria and penile pain.  Musculoskeletal: Negative.  Negative for arthralgias, back pain, gait problem and joint swelling.  Skin: Negative.   Allergic/Immunologic: Negative.  Negative for environmental allergies, food allergies and immunocompromised  state.  Neurological: Positive for weakness and numbness. Negative for dizziness, tremors, seizures, syncope, facial asymmetry, speech difficulty, light-headedness and headaches.  Hematological: Negative.  Negative for adenopathy. Does not bruise/bleed easily.  Psychiatric/Behavioral: Negative.  Negative for agitation,  behavioral problems, confusion, decreased concentration, dysphoric mood, hallucinations, self-injury, sleep disturbance and suicidal ideas. The patient is not nervous/anxious and is not hyperactive.      Objective: Vital Signs: BP (!) 134/58 (BP Location: Left Arm, Patient Position: Sitting)   Pulse (!) 57   Ht 6\' 2"  (1.88 m)   Wt 195 lb (88.5 kg)   BMI 25.04 kg/m   Physical Exam Constitutional:      General: He is not in acute distress.    Appearance: He is not ill-appearing, toxic-appearing or diaphoretic.  HENT:     Head: Normocephalic and atraumatic.     Right Ear: There is no impacted cerumen.     Left Ear: There is no impacted cerumen.     Nose: No congestion or rhinorrhea.     Mouth/Throat:     Pharynx: No oropharyngeal exudate or posterior oropharyngeal erythema.  Eyes:     General:        Right eye: No discharge.        Left eye: No discharge.  Neck:     Musculoskeletal: No neck rigidity or muscular tenderness.     Vascular: No carotid bruit.  Cardiovascular:     Pulses: Normal pulses.     Heart sounds: Normal heart sounds. No murmur. No gallop.   Pulmonary:     Effort: No respiratory distress.     Breath sounds: No wheezing.  Abdominal:     General: There is no distension.     Palpations: There is no mass.     Tenderness: There is no abdominal tenderness. There is no right CVA tenderness, left CVA tenderness, guarding or rebound.     Hernia: No hernia is present.  Musculoskeletal:        General: Swelling present.     Right lower leg: Edema present.     Left lower leg: Edema present.  Lymphadenopathy:     Cervical: No cervical adenopathy.  Skin:    Coloration: Skin is not jaundiced or pale.     Findings: No bruising, erythema, lesion or rash.  Neurological:     Motor: Weakness present.     Coordination: Coordination abnormal.     Deep Tendon Reflexes: Reflexes abnormal.  Psychiatric:        Mood and Affect: Mood normal.        Behavior: Behavior  normal.        Thought Content: Thought content normal.        Judgment: Judgment normal.     Back Exam   Tenderness  The patient is experiencing tenderness in the lumbar.  Range of Motion  Extension: abnormal  Flexion: abnormal  Lateral bend right: abnormal  Lateral bend left: abnormal  Rotation right: abnormal  Rotation left: abnormal   Muscle Strength  Right Quadriceps:  5/5  Left Quadriceps:  5/5  Right Hamstrings:  5/5  Left Hamstrings:  5/5   Tests  Straight leg raise right: negative Straight leg raise left: negative  Reflexes  Patellar:  Hyporeflexic normal Achilles:  Hyporeflexic normal Babinski's sign: normal   Other  Toe walk: abnormal Heel walk: abnormal Sensation: normal Gait: normal  Erythema: no back redness Scars: present  Comments:  Right foot DF weakness 3/5. SLR left with pain at  30 degrees short of SLR      Specialty Comments:  No specialty comments available.  Imaging: Xr Lumbar Spine Complete W/bend  Result Date: 07/30/2018 AP and lateral flexion and extension radiographs of the lumbar spine show a grade 1 anterolisthesis of L3-4, DDD L1-2 through L4-5, Grade 1-2 anterolisthesis L4-5. This improves 1-2 mm with extension. 40% compression deformity L5 that is unchanged from 05/10/2018.    PMFS History: Patient Active Problem List   Diagnosis Date Noted  . Chronic left shoulder pain 04/20/2016  . Spinal stenosis 09/02/2015  . Numbness 09/02/2015  . Bradycardia 07/01/2014  . Thrombocytopenia ([place]) 06/30/2014  . CAP (community acquired pneumonia) 06/29/2014  . Hypoxia 06/29/2014  . HTN (hypertension) 06/29/2014  . Prostate cancer ([place]) 06/29/2014  . Hyponatremia 06/29/2014   Past Medical History:  Diagnosis Date  . Abnormal finding of blood chemistry   . Bradycardia 07/01/2014  . CAP (community acquired pneumonia) 06/29/2014  . Heart murmur   . Irregular heart rate   . Melanoma ([place])   . Prostate cancer ([place])   .  Thrombocytopenia ([place]) 06/30/2014    Family History  Problem Relation Age of Onset  . Heart Problems Mother   . Stroke Father     Past Surgical History:  Procedure Laterality Date  . BACK SURGERY    . CATARACT EXTRACTION    . KNEE SURGERY    . MELANOMA EXCISION     Social History   Occupational History  . Occupation: Retired   Tobacco Use  . Smoking status: Never Smoker  . Smokeless tobacco: Never Used  Substance and Sexual Activity  . Alcohol use: No    Alcohol/week: 0.0 standard drinks  . Drug use: No  . Sexual activity: Not on file

## 2018-07-30 NOTE — Patient Instructions (Addendum)
Avoid bending, stooping and avoid lifting weights greater than 10 lbs. Avoid prolong standing and walking. Avoid frequent bending and stooping  No lifting greater than 10 lbs. May use ice or moist heat for pain. Weight loss is of benefit. Handicap license is approved. Dr. Romona Curls secretary/Assistant will call to arrange for epidural steroid injection  Will start fosamax for likely osteoporosis. Start gabapentin for neurogenic pain associated with peripheral neuropathy and lumbar radiculopathy.  Needs wheel chair for long distance greater than 165feet. Light weight wheel chair.

## 2018-08-06 DIAGNOSIS — R609 Edema, unspecified: Secondary | ICD-10-CM | POA: Diagnosis not present

## 2018-08-06 DIAGNOSIS — E559 Vitamin D deficiency, unspecified: Secondary | ICD-10-CM | POA: Diagnosis not present

## 2018-08-07 ENCOUNTER — Telehealth: Payer: Self-pay | Admitting: Radiology

## 2018-08-07 NOTE — Telephone Encounter (Signed)
Ms. Thomas Vega is calling, states [facility] is requesting patients face -to-face notes for approval for a wheelchair.  Can these please be faxed to them.

## 2018-08-07 NOTE — Telephone Encounter (Signed)
07/30/2018 ov note faxed to [facility] [phone]

## 2018-08-09 DIAGNOSIS — C61 Malignant neoplasm of prostate: Secondary | ICD-10-CM | POA: Diagnosis not present

## 2018-08-13 DIAGNOSIS — R3912 Poor urinary stream: Secondary | ICD-10-CM | POA: Diagnosis not present

## 2018-08-13 DIAGNOSIS — N403 Nodular prostate with lower urinary tract symptoms: Secondary | ICD-10-CM | POA: Diagnosis not present

## 2018-08-13 DIAGNOSIS — C61 Malignant neoplasm of prostate: Secondary | ICD-10-CM | POA: Diagnosis not present

## 2018-08-15 ENCOUNTER — Other Ambulatory Visit: Payer: Self-pay

## 2018-08-15 ENCOUNTER — Encounter (INDEPENDENT_AMBULATORY_CARE_PROVIDER_SITE_OTHER): Payer: Medicare Other | Admitting: Ophthalmology

## 2018-08-15 DIAGNOSIS — H353211 Exudative age-related macular degeneration, right eye, with active choroidal neovascularization: Secondary | ICD-10-CM

## 2018-08-15 DIAGNOSIS — I10 Essential (primary) hypertension: Secondary | ICD-10-CM | POA: Diagnosis not present

## 2018-08-15 DIAGNOSIS — H353122 Nonexudative age-related macular degeneration, left eye, intermediate dry stage: Secondary | ICD-10-CM | POA: Diagnosis not present

## 2018-08-15 DIAGNOSIS — H43813 Vitreous degeneration, bilateral: Secondary | ICD-10-CM

## 2018-08-15 DIAGNOSIS — H35033 Hypertensive retinopathy, bilateral: Secondary | ICD-10-CM

## 2018-08-17 ENCOUNTER — Telehealth: Payer: Self-pay | Admitting: Radiology

## 2018-08-17 NOTE — Telephone Encounter (Signed)
Patient is calling to sched injection, looks like it ok'd to do as directed by Dr. Ernestina Patches, message is in Tonisha's box, if you want me to call and sched him I will.

## 2018-08-21 NOTE — Telephone Encounter (Signed)
Patient is already scheduled for 08/29/18 at 1330.

## 2018-08-29 ENCOUNTER — Other Ambulatory Visit: Payer: Self-pay

## 2018-08-29 ENCOUNTER — Encounter: Payer: Self-pay | Admitting: Physical Medicine and Rehabilitation

## 2018-08-29 ENCOUNTER — Ambulatory Visit (INDEPENDENT_AMBULATORY_CARE_PROVIDER_SITE_OTHER): Payer: Medicare Other | Admitting: Physical Medicine and Rehabilitation

## 2018-08-29 ENCOUNTER — Ambulatory Visit: Payer: Self-pay

## 2018-08-29 VITALS — BP 132/72 | HR 53

## 2018-08-29 DIAGNOSIS — M961 Postlaminectomy syndrome, not elsewhere classified: Secondary | ICD-10-CM | POA: Diagnosis not present

## 2018-08-29 DIAGNOSIS — M5416 Radiculopathy, lumbar region: Secondary | ICD-10-CM

## 2018-08-29 DIAGNOSIS — M48062 Spinal stenosis, lumbar region with neurogenic claudication: Secondary | ICD-10-CM

## 2018-08-29 MED ORDER — BETAMETHASONE SOD PHOS & ACET 6 (3-3) MG/ML IJ SUSP
12.0000 mg | Freq: Once | INTRAMUSCULAR | Status: AC
  Administered 2018-08-29: 12 mg

## 2018-08-29 NOTE — Progress Notes (Signed)
  Numeric Pain Rating Scale and Functional Assessment Average Pain (5)   In the last MONTH (on 0-10 scale) has pain interfered with the following?  1. General activity like being  able to carry out your everyday physical activities such as walking, climbing stairs, carrying groceries, or moving a chair?  Rating(5)   +Driver, -BT, -Dye Allergies.  

## 2018-09-13 NOTE — Procedures (Signed)
Lumbosacral Transforaminal Epidural Steroid Injection - Sub-Pedicular Approach with Fluoroscopic Guidance  Patient: Thomas Vega      Date of Birth: 05-Nov-1926 MRN: [id number] PCP: Jani Gravel, MD      Visit Date: 08/29/2018   Universal Protocol:    Date/Time: 08/29/2018  Consent Given By: the patient  Position: PRONE  Additional Comments: Vital signs were monitored before and after the procedure. Patient was prepped and draped in the usual sterile fashion. The correct patient, procedure, and site was verified.   Injection Procedure Details:  Procedure Site One Meds Administered:  Meds ordered this encounter  Medications  . betamethasone acetate-betamethasone sodium phosphate (CELESTONE) injection 12 mg    Laterality: Bilateral  Location/Site:  L4-L5  Needle size: 22 G  Needle type: Spinal  Needle Placement: Transforaminal  Findings:    -Comments: Excellent flow of contrast along the nerve and into the epidural space.  Procedure Details: After squaring off the end-plates to get a true AP view, the C-arm was positioned so that an oblique view of the foramen as noted above was visualized. The target area is just inferior to the "nose of the scotty dog" or sub pedicular. The soft tissues overlying this structure were infiltrated with 2-3 ml. of 1% Lidocaine without Epinephrine.  The spinal needle was inserted toward the target using a "trajectory" view along the fluoroscope beam.  Under AP and lateral visualization, the needle was advanced so it did not puncture dura and was located close the 6 O'Clock position of the pedical in AP tracterory. Biplanar projections were used to confirm position. Aspiration was confirmed to be negative for CSF and/or blood. A 1-2 ml. volume of Isovue-250 was injected and flow of contrast was noted at each level. Radiographs were obtained for documentation purposes.   After attaining the desired flow of contrast documented above, a 0.5 to  1.0 ml test dose of 0.25% Marcaine was injected into each respective transforaminal space.  The patient was observed for 90 seconds post injection.  After no sensory deficits were reported, and normal lower extremity motor function was noted,   the above injectate was administered so that equal amounts of the injectate were placed at each foramen (level) into the transforaminal epidural space.   Additional Comments:  The patient tolerated the procedure well Dressing: 2 x 2 sterile gauze and Band-Aid    Post-procedure details: Patient was observed during the procedure. Post-procedure instructions were reviewed.  Patient left the clinic in stable condition.

## 2018-09-13 NOTE — Progress Notes (Signed)
Thomas Vega - 85 y.o. male MRN [id number]  Date of birth: Aug 20, 1926  Office Visit Note: Visit Date: 08/29/2018 PCP: Jani Gravel, MD Referred by: Jani Gravel, MD  Subjective: Chief Complaint  Patient presents with   Lower Back - Pain   Left Leg - Pain   HPI:  Thomas Vega is a 85 y.o. male who comes in today At the request of Dr. Basil Dess for diagnostic and hopefully therapeutic bilateral L4 transforaminal epidural steroid injections.  Patient's history is that he has had ongoing lumbar stenosis for quite some time he is status post prior laminectomy I believe remotely.  He has multilevel severe stenosis at this point.  Prior injection at L3 in March was of no benefit he says.  Even though he says that he tells me the pain that he is having now started just a few weeks ago.  He is having left radicular pain and not really right radicular pain but overall low back pain.  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  He is pretty clear and the fact that the injections really have not helped in the past at least lately.  When I used to see him when he was a little bit younger these did seem to help for quite some time but we just have not seen that this year.  I will complete the injection at a different location which is L4 which there is severe stenosis here as well and will see how he does with follow-up with Dr. Louanne Skye.  I am not sure from an interventional spine standpoint there is really much left to offer except possibly diagnostic medial branch blocks but that would not help his radicular pain.  ROS Otherwise per HPI.  Assessment & Plan: Visit Diagnoses:  1. Lumbar radiculopathy   2. Spinal stenosis of lumbar region with neurogenic claudication   3. Post laminectomy syndrome     Plan: No additional findings.   Meds & Orders:  Meds ordered this encounter  Medications   betamethasone acetate-betamethasone sodium phosphate (CELESTONE) injection 12 mg      Orders Placed This Encounter  Procedures   XR C-ARM NO REPORT   Epidural Steroid injection    Follow-up: Return if symptoms worsen or fail to improve.   Procedures: No procedures performed  Lumbosacral Transforaminal Epidural Steroid Injection - Sub-Pedicular Approach with Fluoroscopic Guidance  Patient: Thomas Vega      Date of Birth: 14-Apr-1926 MRN: [id number] PCP: Jani Gravel, MD      Visit Date: 08/29/2018   Universal Protocol:    Date/Time: 08/29/2018  Consent Given By: the patient  Position: PRONE  Additional Comments: Vital signs were monitored before and after the procedure. Patient was prepped and draped in the usual sterile fashion. The correct patient, procedure, and site was verified.   Injection Procedure Details:  Procedure Site One Meds Administered:  Meds ordered this encounter  Medications   betamethasone acetate-betamethasone sodium phosphate (CELESTONE) injection 12 mg    Laterality: Bilateral  Location/Site:  L4-L5  Needle size: 22 G  Needle type: Spinal  Needle Placement: Transforaminal  Findings:    -Comments: Excellent flow of contrast along the nerve and into the epidural space.  Procedure Details: After squaring off the end-plates to get a true AP view, the C-arm was positioned so that an oblique view of the foramen as noted above was visualized. The target area is just inferior to the "nose of the scotty dog" or  sub pedicular. The soft tissues overlying this structure were infiltrated with 2-3 ml. of 1% Lidocaine without Epinephrine.  The spinal needle was inserted toward the target using a "trajectory" view along the fluoroscope beam.  Under AP and lateral visualization, the needle was advanced so it did not puncture dura and was located close the 6 O'Clock position of the pedical in AP tracterory. Biplanar projections were used to confirm position. Aspiration was confirmed to be negative for CSF and/or blood. A 1-2 ml. volume  of Isovue-250 was injected and flow of contrast was noted at each level. Radiographs were obtained for documentation purposes.   After attaining the desired flow of contrast documented above, a 0.5 to 1.0 ml test dose of 0.25% Marcaine was injected into each respective transforaminal space.  The patient was observed for 90 seconds post injection.  After no sensory deficits were reported, and normal lower extremity motor function was noted,   the above injectate was administered so that equal amounts of the injectate were placed at each foramen (level) into the transforaminal epidural space.   Additional Comments:  The patient tolerated the procedure well Dressing: 2 x 2 sterile gauze and Band-Aid    Post-procedure details: Patient was observed during the procedure. Post-procedure instructions were reviewed.  Patient left the clinic in stable condition.    Clinical History: MRI LUMBAR SPINE WITHOUT CONTRAST  TECHNIQUE: Multiplanar, multisequence MR imaging of the lumbar spine was performed. No intravenous contrast was administered.  COMPARISON:  Plain films lumbar spine 04/05/2018. MRI lumbar spine 07/02/2015.  FINDINGS: Segmentation:  Standard.  Alignment: 0.4 cm facet mediated anterolisthesis L4 on L5 is unchanged.  Vertebrae: A superior endplate compression fracture of L5 is seen with vertebral body height loss of approximately 40% anteriorly. There is marrow edema within the vertebral body consistent with acute or subacute injury. No bony retropulsion or involvement of the posterior elements. No other acute abnormality. Remote superior endplate compression fracture versus Schmorl's in L1 is unchanged. 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  Conus medullaris and cauda equina: Conus extends to the L1 level. Conus and cauda equina appear normal.  Paraspinal and other soft tissues: Negative.  Disc levels:  T11-12 is  imaged in the sagittal plane only. Disc bulge appears to efface the ventral thecal sac and is more prominent than on the prior MRI.  T12-L1: Mild facet degenerative change.  Otherwise negative.  L1-2: Mild-to-moderate facet arthropathy and a shallow disc bulge. No stenosis.  L2-3: Bulky ligamentum flavum thickening, shallow disc bulge and moderate facet arthropathy. Severe central canal stenosis is unchanged. The foramina are open.  L3-4: Bulky ligamentum flavum thickening, advanced facet arthropathy and a shallow disc bulge cause unchanged severe central canal stenosis. The left foramen is open. Mild right foraminal narrowing noted. The appearance is unchanged.  L4-5: Advanced facet arthropathy, disc bulge and bulky ligamentum flavum thickening cause severe central canal stenosis. The foramina are open.  L5-S1: Status post right laminotomy. There is a disc bulge with some endplate spur. The central canal is open. The foramina are also patent.  IMPRESSION: Acute or subacute anterior, superior endplate compression fracture of L5 with vertebral body height loss approximately 40%. No other acute abnormality or change since the prior MRI.  Congenitally narrow central canal.  No change in severe congenital and acquired central canal stenosis L2-3, L3-4 and L4-5.  Status post right laminotomy at L5-S1. The central canal and foramina are open.   Electronically Signed  By: Inge Rise M.D.   On: 05/19/2018 15:53     Objective:  VS:  HT:     WT:    BMI:      BP:132/72   HR:(!) 53bpm   TEMP: ( )   RESP:  Physical Exam  Ortho Exam Imaging: No results found.

## 2018-09-20 ENCOUNTER — Encounter: Payer: Self-pay | Admitting: Specialist

## 2018-09-20 ENCOUNTER — Ambulatory Visit (INDEPENDENT_AMBULATORY_CARE_PROVIDER_SITE_OTHER): Payer: Medicare Other | Admitting: Specialist

## 2018-09-20 ENCOUNTER — Other Ambulatory Visit: Payer: Self-pay

## 2018-09-20 VITALS — BP 169/60 | HR 69 | Ht 74.0 in | Wt 195.0 lb

## 2018-09-20 DIAGNOSIS — M48062 Spinal stenosis, lumbar region with neurogenic claudication: Secondary | ICD-10-CM

## 2018-09-20 NOTE — Patient Instructions (Addendum)
Plan: Avoid bending, stooping and avoid lifting weights greater than 10 lbs. Avoid prolong standing and walking. Avoid frequent bending and stooping  No lifting greater than 10 lbs. May use ice or moist heat for pain. Weight loss is of benefit. Handicap license is approved. Please call if pain and claudication is worsening and we would contact Dr. Romona Curls secretary/Assistant and they will call to arrange for epidural steroid injection  Naprosyn can cause congestive heart failure and fluid retention, gabapentin can cause fluid retention.  Stop gabapentin and try to avoid use of anaprox or naprosyn or any NSAIDs.

## 2018-09-20 NOTE — Progress Notes (Signed)
Office Visit Note   Patient: Thomas Vega           Date of Birth: [birth year]           MRN: [id number] Visit Date: 09/20/2018              Requested by: Jani Gravel, [address] [place],  [place] [address] PCP: Jani Gravel, MD   Assessment & Plan: Visit Diagnoses:  1. Spinal stenosis of lumbar region with neurogenic claudication     Plan: Avoid bending, stooping and avoid lifting weights greater than 10 lbs. Avoid prolong standing and walking. Avoid frequent bending and stooping  No lifting greater than 10 lbs. May use ice or moist heat for pain. Weight loss is of benefit. Handicap license is approved. Please call if pain and claudication is worsening and we would contact Dr. Romona Curls secretary/Assistant and they will call to arrange for epidural steroid injection  Naprosyn can cause congestive heart failure and fluid retention, gabapentin can cause fluid retention.  Stop gabapentin and try to avoid use of anaprox or naprosyn or any NSAIDs Follow-Up Instructions: No follow-ups on file.   Orders:  No orders of the defined types were placed in this encounter.  No orders of the defined types were placed in this encounter.     Procedures: No procedures performed   Clinical Data: No additional findings.   Subjective: Chief Complaint  Patient presents with  . Lower Back - Follow-up    He had Bil L4-5 TF injection with Dr. Ernestina Patches on 08/29/2018 says it did help, he is not taking as much pain med    85 year old male with history of lumbar spinal stenosis multiple levels lumbar spine, having difficulty standing and walking. Had ESIs bilateral transforamenal ESI L4-5 has seen some improvement not 100% but it is better. He has been voiding at night frequently. He sees Dr. Jeffie Pollock and has prostate Ca.    Review of Systems  Constitutional: Negative.   HENT: Negative.   Eyes: Negative.   Respiratory: Negative.   Cardiovascular: Negative.    Gastrointestinal: Negative.   Endocrine: Negative.   Genitourinary: Negative.   Musculoskeletal: Negative.   Skin: Negative.   Allergic/Immunologic: Negative.   Neurological: Negative.   Hematological: Negative.   Psychiatric/Behavioral: Negative.      Objective: Vital Signs: BP (!) 169/60 (BP Location: Left Arm, Patient Position: Sitting)   Pulse 69   Ht 6\' 2"  (1.88 m)   Wt 195 lb (88.5 kg)   BMI 25.04 kg/m   Physical Exam Constitutional:      General: He is not in acute distress.    Appearance: He is well-developed and normal weight. He is not ill-appearing, toxic-appearing or diaphoretic.  HENT:     Head: Normocephalic and atraumatic.  Eyes:     Pupils: Pupils are equal, round, and reactive to light.  Neck:     Musculoskeletal: Normal range of motion and neck supple.  Pulmonary:     Effort: Pulmonary effort is normal.     Breath sounds: Normal breath sounds.  Abdominal:     General: Bowel sounds are normal.     Palpations: Abdomen is soft.  Skin:    General: Skin is warm and dry.  Neurological:     Mental Status: He is alert and oriented to person, place, and time.  Psychiatric:        Behavior: Behavior normal.        Thought Content: Thought content  normal.        Judgment: Judgment normal.     Back Exam   Tenderness  The patient is experiencing tenderness in the lumbar.  Range of Motion  Extension: abnormal  Flexion: normal  Lateral bend right: abnormal  Lateral bend left: abnormal  Rotation right: abnormal  Rotation left: abnormal   Muscle Strength  Right Quadriceps:  5/5  Left Quadriceps:  5/5  Right Hamstrings:  5/5  Left Hamstrings:  5/5   Tests  Straight leg raise right: negative Straight leg raise left: negative  Other  Toe walk: abnormal Heel walk: abnormal Sensation: decreased Gait: drop-foot  Erythema: no back redness Scars: absent  Comments:  Right foot drop DF strength is 2/5 rest intact.       Specialty Comments:   No specialty comments available.  Imaging: No results found.   PMFS History: Patient Active Problem List   Diagnosis Date Noted  . Chronic left shoulder pain 04/20/2016  . Spinal stenosis 09/02/2015  . Numbness 09/02/2015  . Bradycardia 07/01/2014  . Thrombocytopenia ([place]) 06/30/2014  . CAP (community acquired pneumonia) 06/29/2014  . Hypoxia 06/29/2014  . HTN (hypertension) 06/29/2014  . Prostate cancer ([place]) 06/29/2014  . Hyponatremia 06/29/2014   Past Medical History:  Diagnosis Date  . Abnormal finding of blood chemistry   . Bradycardia 07/01/2014  . CAP (community acquired pneumonia) 06/29/2014  . Heart murmur   . Irregular heart rate   . Melanoma ([place])   . Prostate cancer ([place])   . Thrombocytopenia ([place]) 06/30/2014    Family History  Problem Relation Age of Onset  . Heart Problems Mother   . Stroke Father     Past Surgical History:  Procedure Laterality Date  . BACK SURGERY    . CATARACT EXTRACTION    . KNEE SURGERY    . MELANOMA EXCISION     Social History   Occupational History  . Occupation: Retired   Tobacco Use  . Smoking status: Never Smoker  . Smokeless tobacco: Never Used  Substance and Sexual Activity  . Alcohol use: No    Alcohol/week: 0.0 standard drinks  . Drug use: No  . Sexual activity: Not on file

## 2018-09-25 DIAGNOSIS — S91114A Laceration without foreign body of right lesser toe(s) without damage to nail, initial encounter: Secondary | ICD-10-CM | POA: Diagnosis not present

## 2018-09-25 DIAGNOSIS — I10 Essential (primary) hypertension: Secondary | ICD-10-CM | POA: Diagnosis not present

## 2018-09-25 DIAGNOSIS — S91124A Laceration with foreign body of right lesser toe(s) without damage to nail, initial encounter: Secondary | ICD-10-CM | POA: Diagnosis not present

## 2018-09-25 DIAGNOSIS — Z0000 Encounter for general adult medical examination without abnormal findings: Secondary | ICD-10-CM | POA: Diagnosis not present

## 2018-09-25 DIAGNOSIS — E785 Hyperlipidemia, unspecified: Secondary | ICD-10-CM | POA: Diagnosis not present

## 2018-09-25 DIAGNOSIS — S91312A Laceration without foreign body, left foot, initial encounter: Secondary | ICD-10-CM | POA: Diagnosis not present

## 2018-09-25 DIAGNOSIS — E559 Vitamin D deficiency, unspecified: Secondary | ICD-10-CM | POA: Diagnosis not present

## 2018-09-25 DIAGNOSIS — S91119A Laceration without foreign body of unspecified toe without damage to nail, initial encounter: Secondary | ICD-10-CM | POA: Diagnosis not present

## 2018-09-25 DIAGNOSIS — Z125 Encounter for screening for malignant neoplasm of prostate: Secondary | ICD-10-CM | POA: Diagnosis not present

## 2018-09-26 ENCOUNTER — Encounter (INDEPENDENT_AMBULATORY_CARE_PROVIDER_SITE_OTHER): Payer: Medicare Other | Admitting: Ophthalmology

## 2018-10-02 DIAGNOSIS — S0292XS Unspecified fracture of facial bones, sequela: Secondary | ICD-10-CM | POA: Diagnosis not present

## 2018-10-02 DIAGNOSIS — E785 Hyperlipidemia, unspecified: Secondary | ICD-10-CM | POA: Diagnosis not present

## 2018-10-02 DIAGNOSIS — I10 Essential (primary) hypertension: Secondary | ICD-10-CM | POA: Diagnosis not present

## 2018-10-02 DIAGNOSIS — C61 Malignant neoplasm of prostate: Secondary | ICD-10-CM | POA: Diagnosis not present

## 2018-10-02 DIAGNOSIS — Z9181 History of falling: Secondary | ICD-10-CM | POA: Diagnosis not present

## 2018-10-02 DIAGNOSIS — Z0000 Encounter for general adult medical examination without abnormal findings: Secondary | ICD-10-CM | POA: Diagnosis not present

## 2018-10-02 DIAGNOSIS — R296 Repeated falls: Secondary | ICD-10-CM | POA: Diagnosis not present

## 2018-10-02 DIAGNOSIS — R2681 Unsteadiness on feet: Secondary | ICD-10-CM | POA: Diagnosis not present

## 2018-10-02 DIAGNOSIS — R269 Unspecified abnormalities of gait and mobility: Secondary | ICD-10-CM | POA: Diagnosis not present

## 2018-10-02 DIAGNOSIS — E559 Vitamin D deficiency, unspecified: Secondary | ICD-10-CM | POA: Diagnosis not present

## 2018-10-02 DIAGNOSIS — D649 Anemia, unspecified: Secondary | ICD-10-CM | POA: Diagnosis not present

## 2018-10-04 DIAGNOSIS — Z4802 Encounter for removal of sutures: Secondary | ICD-10-CM | POA: Diagnosis not present

## 2018-10-12 ENCOUNTER — Other Ambulatory Visit: Payer: Self-pay

## 2018-10-12 ENCOUNTER — Encounter (INDEPENDENT_AMBULATORY_CARE_PROVIDER_SITE_OTHER): Payer: Medicare Other | Admitting: Ophthalmology

## 2018-10-12 DIAGNOSIS — I10 Essential (primary) hypertension: Secondary | ICD-10-CM

## 2018-10-12 DIAGNOSIS — H43813 Vitreous degeneration, bilateral: Secondary | ICD-10-CM | POA: Diagnosis not present

## 2018-10-12 DIAGNOSIS — H35033 Hypertensive retinopathy, bilateral: Secondary | ICD-10-CM | POA: Diagnosis not present

## 2018-10-12 DIAGNOSIS — H353122 Nonexudative age-related macular degeneration, left eye, intermediate dry stage: Secondary | ICD-10-CM

## 2018-10-12 DIAGNOSIS — H353211 Exudative age-related macular degeneration, right eye, with active choroidal neovascularization: Secondary | ICD-10-CM | POA: Diagnosis not present

## 2018-10-23 DIAGNOSIS — L821 Other seborrheic keratosis: Secondary | ICD-10-CM | POA: Diagnosis not present

## 2018-10-23 DIAGNOSIS — L814 Other melanin hyperpigmentation: Secondary | ICD-10-CM | POA: Diagnosis not present

## 2018-10-23 DIAGNOSIS — L819 Disorder of pigmentation, unspecified: Secondary | ICD-10-CM | POA: Diagnosis not present

## 2018-10-23 DIAGNOSIS — D1801 Hemangioma of skin and subcutaneous tissue: Secondary | ICD-10-CM | POA: Diagnosis not present

## 2018-10-23 DIAGNOSIS — D229 Melanocytic nevi, unspecified: Secondary | ICD-10-CM | POA: Diagnosis not present

## 2018-10-23 DIAGNOSIS — Z85828 Personal history of other malignant neoplasm of skin: Secondary | ICD-10-CM | POA: Diagnosis not present

## 2018-10-23 DIAGNOSIS — Z85820 Personal history of malignant melanoma of skin: Secondary | ICD-10-CM | POA: Diagnosis not present

## 2018-11-12 ENCOUNTER — Encounter (INDEPENDENT_AMBULATORY_CARE_PROVIDER_SITE_OTHER): Payer: Medicare Other | Admitting: Ophthalmology

## 2018-11-12 ENCOUNTER — Other Ambulatory Visit: Payer: Self-pay

## 2018-11-12 DIAGNOSIS — H43813 Vitreous degeneration, bilateral: Secondary | ICD-10-CM | POA: Diagnosis not present

## 2018-11-12 DIAGNOSIS — H353122 Nonexudative age-related macular degeneration, left eye, intermediate dry stage: Secondary | ICD-10-CM | POA: Diagnosis not present

## 2018-11-12 DIAGNOSIS — I10 Essential (primary) hypertension: Secondary | ICD-10-CM

## 2018-11-12 DIAGNOSIS — H353211 Exudative age-related macular degeneration, right eye, with active choroidal neovascularization: Secondary | ICD-10-CM | POA: Diagnosis not present

## 2018-11-12 DIAGNOSIS — H35033 Hypertensive retinopathy, bilateral: Secondary | ICD-10-CM

## 2018-12-14 ENCOUNTER — Other Ambulatory Visit: Payer: Self-pay

## 2018-12-14 ENCOUNTER — Encounter (INDEPENDENT_AMBULATORY_CARE_PROVIDER_SITE_OTHER): Payer: Medicare Other | Admitting: Ophthalmology

## 2018-12-14 DIAGNOSIS — I10 Essential (primary) hypertension: Secondary | ICD-10-CM | POA: Diagnosis not present

## 2018-12-14 DIAGNOSIS — H35033 Hypertensive retinopathy, bilateral: Secondary | ICD-10-CM

## 2018-12-14 DIAGNOSIS — H43813 Vitreous degeneration, bilateral: Secondary | ICD-10-CM | POA: Diagnosis not present

## 2018-12-14 DIAGNOSIS — H353211 Exudative age-related macular degeneration, right eye, with active choroidal neovascularization: Secondary | ICD-10-CM | POA: Diagnosis not present

## 2018-12-14 DIAGNOSIS — H353122 Nonexudative age-related macular degeneration, left eye, intermediate dry stage: Secondary | ICD-10-CM | POA: Diagnosis not present

## 2019-01-11 ENCOUNTER — Encounter (INDEPENDENT_AMBULATORY_CARE_PROVIDER_SITE_OTHER): Payer: Medicare Other | Admitting: Ophthalmology

## 2019-01-11 DIAGNOSIS — H35033 Hypertensive retinopathy, bilateral: Secondary | ICD-10-CM | POA: Diagnosis not present

## 2019-01-11 DIAGNOSIS — Z23 Encounter for immunization: Secondary | ICD-10-CM | POA: Diagnosis not present

## 2019-01-11 DIAGNOSIS — I10 Essential (primary) hypertension: Secondary | ICD-10-CM

## 2019-01-11 DIAGNOSIS — H43813 Vitreous degeneration, bilateral: Secondary | ICD-10-CM

## 2019-01-11 DIAGNOSIS — H353211 Exudative age-related macular degeneration, right eye, with active choroidal neovascularization: Secondary | ICD-10-CM | POA: Diagnosis not present

## 2019-01-11 DIAGNOSIS — H353122 Nonexudative age-related macular degeneration, left eye, intermediate dry stage: Secondary | ICD-10-CM

## 2019-01-21 ENCOUNTER — Ambulatory Visit: Payer: Medicare Other | Admitting: Specialist

## 2019-01-31 ENCOUNTER — Other Ambulatory Visit: Payer: Self-pay

## 2019-01-31 ENCOUNTER — Ambulatory Visit (INDEPENDENT_AMBULATORY_CARE_PROVIDER_SITE_OTHER): Payer: Medicare Other | Admitting: Specialist

## 2019-01-31 ENCOUNTER — Encounter: Payer: Self-pay | Admitting: Specialist

## 2019-01-31 VITALS — BP 157/71 | HR 59 | Ht 74.0 in | Wt 195.0 lb

## 2019-01-31 DIAGNOSIS — M48062 Spinal stenosis, lumbar region with neurogenic claudication: Secondary | ICD-10-CM | POA: Diagnosis not present

## 2019-01-31 DIAGNOSIS — M5440 Lumbago with sciatica, unspecified side: Secondary | ICD-10-CM | POA: Diagnosis not present

## 2019-01-31 NOTE — Patient Instructions (Signed)
Plan: Avoid bending, stooping and avoid lifting weights greater than 10 lbs. Avoid prolong standing and walking. Avoid frequent bending and stooping  No lifting greater than 10 lbs. May use ice or moist heat for pain. Weight loss is of benefit. Handicap license is approved. Dr. Romona Curls secretary/Assistant will call to arrange for epidural steroid injection  Weight loss, Avoid NSIADs like diclofenac may cause fluid retention or congestive heart failure and exercise. Well padded shoes help. Hot showers in the AM.  Injection with steroid may be of benefit. Hemp CBD capsules, amazon.com 5,000-7,000 mg per bottle, 60 capsules per bottle, take one capsule twice a day. Cane in the left hand to use with left leg weight bearing.

## 2019-01-31 NOTE — Progress Notes (Signed)
Office Visit Note   Patient: Thomas Vega           Date of Birth: [birth year]           MRN: [id number] Visit Date: 01/31/2019              Requested by: Jani Gravel, MD [address] [place],  [place] [address] PCP: Jani Gravel, MD   Assessment & Plan: Visit Diagnoses:  1. Spinal stenosis of lumbar region with neurogenic claudication   2. Acute bilateral low back pain with sciatica, sciatica laterality unspecified     Plan: Avoid bending, stooping and avoid lifting weights greater than 10 lbs. Avoid prolong standing and walking. Avoid frequent bending and stooping  No lifting greater than 10 lbs. May use ice or moist heat for pain. Weight loss is of benefit. Handicap license is approved. Dr. Romona Curls secretary/Assistant will call to arrange for epidural steroid injection  Weight loss, Avoid NSIADs like diclofenac may cause fluid retention or congestive heart failure and exercise. Well padded shoes help. Hot showers in the AM.  Injection with steroid may be of benefit. Hemp CBD capsules, amazon.com 5,000-7,000 mg per bottle, 60 capsules per bottle, take one capsule twice a day. Walker for ambulation.  Follow-Up Instructions: No follow-ups on file.   Orders:  Orders Placed This Encounter  Procedures  . Ambulatory referral to Physical Medicine Rehab   No orders of the defined types were placed in this encounter.     Procedures: No procedures performed   Clinical Data: Findings:  CLINICAL DATA:  Low back and bilateral hip pain. History of lumbar surgery approximately 10 years ago.  EXAM: MRI LUMBAR SPINE WITHOUT CONTRAST  TECHNIQUE: Multiplanar, multisequence MR imaging of the lumbar spine was performed. No intravenous contrast was administered.  COMPARISON:  Plain films lumbar spine 04/05/2018. MRI lumbar spine 07/02/2015.  FINDINGS: Segmentation:  Standard.  Alignment: 0.4 cm facet mediated anterolisthesis L4 on L5 is unchanged.   Vertebrae: A superior endplate compression fracture of L5 is seen with vertebral body height loss of approximately 40% anteriorly. There is marrow edema within the vertebral body consistent with acute or subacute injury. No bony retropulsion or involvement of the posterior elements. No other acute abnormality. Remote superior endplate compression fracture versus Schmorl's in L1 is unchanged. 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  Conus medullaris and cauda equina: Conus extends to the L1 level. Conus and cauda equina appear normal.  Paraspinal and other soft tissues: Negative.  Disc levels:  T11-12 is imaged in the sagittal plane only. Disc bulge appears to efface the ventral thecal sac and is more prominent than on the prior MRI.  T12-L1: Mild facet degenerative change.  Otherwise negative.  L1-2: Mild-to-moderate facet arthropathy and a shallow disc bulge. No stenosis.  L2-3: Bulky ligamentum flavum thickening, shallow disc bulge and moderate facet arthropathy. Severe central canal stenosis is unchanged. The foramina are open.  L3-4: Bulky ligamentum flavum thickening, advanced facet arthropathy and a shallow disc bulge cause unchanged severe central canal stenosis. The left foramen is open. Mild right foraminal narrowing noted. The appearance is unchanged.  L4-5: Advanced facet arthropathy, disc bulge and bulky ligamentum flavum thickening cause severe central canal stenosis. The foramina are open.  L5-S1: Status post right laminotomy. There is a disc bulge with some endplate spur. The central canal is open. The foramina are also patent.  IMPRESSION: Acute or subacute anterior, superior endplate compression fracture of L5  with vertebral body height loss approximately 40%. No other acute abnormality or change since the prior MRI.  Congenitally narrow central canal.  No change in severe congenital and acquired  central canal stenosis L2-3, L3-4 and L4-5.  Status post right laminotomy at L5-S1. The central canal and foramina are open.   Electronically Signed   By: Inge Rise M.D.   On: 05/19/2018 15:53     Subjective: Chief Complaint  Patient presents with  . Lower Back - Follow-up    85 year old male with history of heart condition and previous PUD and significant GI bleed with NSAIDs. He is unable to drive and has pain in the back and into the buttocks and the thighs. Pain worse with standing and walking.He feels weak in the legs. In the AM he has trouble exercising with back and leg pain. He is doing exercise to keep him moving. The assisted living  Nurses are helping him at home and assisting with ADLs. No bowel or bladder difficulty. Has difficulty pullling up the right foot. Had right TKR in 2005. Pain is worsening. The last set of ESIs done  08/29/2018. He has severe spinal stenosis multiple level lumbar spine.           Review of Systems  Constitutional: Negative.   HENT: Negative.   Eyes: Negative.   Respiratory: Negative.   Cardiovascular: Negative.   Gastrointestinal: Negative.   Endocrine: Negative.   Genitourinary: Negative.   Musculoskeletal: Negative.   Skin: Negative.   Allergic/Immunologic: Negative.   Neurological: Negative.   Hematological: Negative.   Psychiatric/Behavioral: Negative.      Objective: Vital Signs: BP (!) 157/71 (BP Location: Left Arm, Patient Position: Sitting)   Pulse (!) 59   Ht 6\' 2"  (1.88 m)   Wt 195 lb (88.5 kg)   BMI 25.04 kg/m   Physical Exam Constitutional:      Appearance: He is well-developed.  HENT:     Head: Normocephalic and atraumatic.  Eyes:     Pupils: Pupils are equal, round, and reactive to light.  Neck:     Musculoskeletal: Normal range of motion and neck supple.  Pulmonary:     Effort: Pulmonary effort is normal.     Breath sounds: Normal breath sounds.  Abdominal:     General: Bowel sounds  are normal.     Palpations: Abdomen is soft.  Skin:    General: Skin is warm and dry.  Neurological:     Mental Status: He is alert and oriented to person, place, and time.  Psychiatric:        Behavior: Behavior normal.        Thought Content: Thought content normal.        Judgment: Judgment normal.     Back Exam   Tenderness  The patient is experiencing tenderness in the lumbar.  Range of Motion  Extension: abnormal  Flexion: normal  Lateral bend right: normal  Lateral bend left: normal  Rotation right: normal  Rotation left: normal   Muscle Strength  Right Quadriceps:  5/5  Left Quadriceps:  5/5  Right Hamstrings:  5/5  Left Hamstrings:  5/5   Tests  Straight leg raise right: negative Straight leg raise left: negative  Reflexes  Patellar: 0/4 Achilles: 0/4 Babinski's sign: normal   Other  Toe walk: abnormal Heel walk: abnormal Sensation: decreased Gait: drop-foot  Erythema: no back redness Scars: absent  Comments:  Right foot weakness 4/5, left foot DF is 5/5. Remaining  motor is normal.       Specialty Comments:  No specialty comments available.  Imaging: No results found.   PMFS History: Patient Active Problem List   Diagnosis Date Noted  . Chronic left shoulder pain 04/20/2016  . Spinal stenosis 09/02/2015  . Numbness 09/02/2015  . Bradycardia 07/01/2014  . Thrombocytopenia ([place]) 06/30/2014  . CAP (community acquired pneumonia) 06/29/2014  . Hypoxia 06/29/2014  . HTN (hypertension) 06/29/2014  . Prostate cancer ([place]) 06/29/2014  . Hyponatremia 06/29/2014   Past Medical History:  Diagnosis Date  . Abnormal finding of blood chemistry   . Bradycardia 07/01/2014  . CAP (community acquired pneumonia) 06/29/2014  . Heart murmur   . Irregular heart rate   . Melanoma ([place])   . Prostate cancer ([place])   . Thrombocytopenia ([place]) 06/30/2014    Family History  Problem Relation Age of Onset  . Heart Problems Mother   . Stroke Father      Past Surgical History:  Procedure Laterality Date  . BACK SURGERY    . CATARACT EXTRACTION    . KNEE SURGERY    . MELANOMA EXCISION     Social History   Occupational History  . Occupation: Retired   Tobacco Use  . Smoking status: Never Smoker  . Smokeless tobacco: Never Used  Substance and Sexual Activity  . Alcohol use: No    Alcohol/week: 0.0 standard drinks  . Drug use: No  . Sexual activity: Not on file

## 2019-02-06 ENCOUNTER — Encounter (INDEPENDENT_AMBULATORY_CARE_PROVIDER_SITE_OTHER): Payer: Medicare Other | Admitting: Ophthalmology

## 2019-02-06 DIAGNOSIS — H353122 Nonexudative age-related macular degeneration, left eye, intermediate dry stage: Secondary | ICD-10-CM | POA: Diagnosis not present

## 2019-02-06 DIAGNOSIS — H43813 Vitreous degeneration, bilateral: Secondary | ICD-10-CM

## 2019-02-06 DIAGNOSIS — C61 Malignant neoplasm of prostate: Secondary | ICD-10-CM | POA: Diagnosis not present

## 2019-02-06 DIAGNOSIS — H35033 Hypertensive retinopathy, bilateral: Secondary | ICD-10-CM

## 2019-02-06 DIAGNOSIS — H353211 Exudative age-related macular degeneration, right eye, with active choroidal neovascularization: Secondary | ICD-10-CM

## 2019-02-06 DIAGNOSIS — I10 Essential (primary) hypertension: Secondary | ICD-10-CM

## 2019-02-13 ENCOUNTER — Other Ambulatory Visit: Payer: Self-pay | Admitting: Urology

## 2019-02-13 DIAGNOSIS — C61 Malignant neoplasm of prostate: Secondary | ICD-10-CM | POA: Diagnosis not present

## 2019-02-13 DIAGNOSIS — R351 Nocturia: Secondary | ICD-10-CM | POA: Diagnosis not present

## 2019-02-13 DIAGNOSIS — R972 Elevated prostate specific antigen [PSA]: Secondary | ICD-10-CM | POA: Diagnosis not present

## 2019-02-13 DIAGNOSIS — N403 Nodular prostate with lower urinary tract symptoms: Secondary | ICD-10-CM | POA: Diagnosis not present

## 2019-02-20 DIAGNOSIS — C61 Malignant neoplasm of prostate: Secondary | ICD-10-CM | POA: Diagnosis not present

## 2019-02-20 DIAGNOSIS — N400 Enlarged prostate without lower urinary tract symptoms: Secondary | ICD-10-CM | POA: Diagnosis not present

## 2019-03-01 ENCOUNTER — Encounter (HOSPITAL_COMMUNITY)
Admission: RE | Admit: 2019-03-01 | Discharge: 2019-03-01 | Disposition: A | Discharge status: Home / Self Care | Payer: Medicare Other | Source: Ambulatory Visit | Attending: Urology | Admitting: Urology

## 2019-03-01 ENCOUNTER — Other Ambulatory Visit: Payer: Self-pay

## 2019-03-01 DIAGNOSIS — C61 Malignant neoplasm of prostate: Secondary | ICD-10-CM | POA: Diagnosis not present

## 2019-03-01 MED ORDER — TECHNETIUM TC 99M MEDRONATE IV KIT
20.0000 | PACK | Freq: Once | INTRAVENOUS | Status: AC | PRN
  Administered 2019-03-01: 10:00:00 21.5 via INTRAVENOUS

## 2019-03-06 ENCOUNTER — Other Ambulatory Visit: Payer: Self-pay

## 2019-03-06 ENCOUNTER — Encounter (INDEPENDENT_AMBULATORY_CARE_PROVIDER_SITE_OTHER): Payer: Medicare Other | Admitting: Ophthalmology

## 2019-03-06 DIAGNOSIS — H353122 Nonexudative age-related macular degeneration, left eye, intermediate dry stage: Secondary | ICD-10-CM

## 2019-03-06 DIAGNOSIS — H353211 Exudative age-related macular degeneration, right eye, with active choroidal neovascularization: Secondary | ICD-10-CM

## 2019-03-06 DIAGNOSIS — I10 Essential (primary) hypertension: Secondary | ICD-10-CM

## 2019-03-06 DIAGNOSIS — H35033 Hypertensive retinopathy, bilateral: Secondary | ICD-10-CM

## 2019-03-06 DIAGNOSIS — H43813 Vitreous degeneration, bilateral: Secondary | ICD-10-CM

## 2019-03-08 ENCOUNTER — Encounter (INDEPENDENT_AMBULATORY_CARE_PROVIDER_SITE_OTHER): Payer: Medicare Other | Admitting: Ophthalmology

## 2019-04-03 ENCOUNTER — Encounter (INDEPENDENT_AMBULATORY_CARE_PROVIDER_SITE_OTHER): Payer: Medicare Other | Admitting: Ophthalmology

## 2019-04-03 DIAGNOSIS — H43813 Vitreous degeneration, bilateral: Secondary | ICD-10-CM | POA: Diagnosis not present

## 2019-04-03 DIAGNOSIS — I10 Essential (primary) hypertension: Secondary | ICD-10-CM

## 2019-04-03 DIAGNOSIS — H35033 Hypertensive retinopathy, bilateral: Secondary | ICD-10-CM | POA: Diagnosis not present

## 2019-04-03 DIAGNOSIS — H353122 Nonexudative age-related macular degeneration, left eye, intermediate dry stage: Secondary | ICD-10-CM

## 2019-04-03 DIAGNOSIS — H353211 Exudative age-related macular degeneration, right eye, with active choroidal neovascularization: Secondary | ICD-10-CM | POA: Diagnosis not present

## 2019-04-10 DIAGNOSIS — D649 Anemia, unspecified: Secondary | ICD-10-CM | POA: Diagnosis not present

## 2019-04-10 DIAGNOSIS — I10 Essential (primary) hypertension: Secondary | ICD-10-CM | POA: Diagnosis not present

## 2019-04-10 DIAGNOSIS — C61 Malignant neoplasm of prostate: Secondary | ICD-10-CM | POA: Diagnosis not present

## 2019-05-01 ENCOUNTER — Encounter (INDEPENDENT_AMBULATORY_CARE_PROVIDER_SITE_OTHER): Payer: Medicare Other | Admitting: Ophthalmology

## 2019-05-01 DIAGNOSIS — H353122 Nonexudative age-related macular degeneration, left eye, intermediate dry stage: Secondary | ICD-10-CM

## 2019-05-01 DIAGNOSIS — H35033 Hypertensive retinopathy, bilateral: Secondary | ICD-10-CM | POA: Diagnosis not present

## 2019-05-01 DIAGNOSIS — H43813 Vitreous degeneration, bilateral: Secondary | ICD-10-CM | POA: Diagnosis not present

## 2019-05-01 DIAGNOSIS — H353211 Exudative age-related macular degeneration, right eye, with active choroidal neovascularization: Secondary | ICD-10-CM

## 2019-05-01 DIAGNOSIS — I10 Essential (primary) hypertension: Secondary | ICD-10-CM | POA: Diagnosis not present

## 2019-05-02 ENCOUNTER — Ambulatory Visit: Payer: Medicare Other | Admitting: Specialist

## 2019-05-30 ENCOUNTER — Encounter (INDEPENDENT_AMBULATORY_CARE_PROVIDER_SITE_OTHER): Payer: Medicare Other | Admitting: Ophthalmology

## 2019-05-30 ENCOUNTER — Other Ambulatory Visit: Payer: Self-pay

## 2019-05-30 DIAGNOSIS — H35033 Hypertensive retinopathy, bilateral: Secondary | ICD-10-CM

## 2019-05-30 DIAGNOSIS — H353122 Nonexudative age-related macular degeneration, left eye, intermediate dry stage: Secondary | ICD-10-CM

## 2019-05-30 DIAGNOSIS — H43813 Vitreous degeneration, bilateral: Secondary | ICD-10-CM

## 2019-05-30 DIAGNOSIS — H353211 Exudative age-related macular degeneration, right eye, with active choroidal neovascularization: Secondary | ICD-10-CM

## 2019-05-30 DIAGNOSIS — I10 Essential (primary) hypertension: Secondary | ICD-10-CM | POA: Diagnosis not present

## 2019-06-05 DIAGNOSIS — C61 Malignant neoplasm of prostate: Secondary | ICD-10-CM | POA: Diagnosis not present

## 2019-06-12 DIAGNOSIS — C61 Malignant neoplasm of prostate: Secondary | ICD-10-CM | POA: Diagnosis not present

## 2019-06-12 DIAGNOSIS — R972 Elevated prostate specific antigen [PSA]: Secondary | ICD-10-CM | POA: Diagnosis not present

## 2019-06-12 DIAGNOSIS — R351 Nocturia: Secondary | ICD-10-CM | POA: Diagnosis not present

## 2019-06-12 DIAGNOSIS — N403 Nodular prostate with lower urinary tract symptoms: Secondary | ICD-10-CM | POA: Diagnosis not present

## 2019-07-04 ENCOUNTER — Encounter (INDEPENDENT_AMBULATORY_CARE_PROVIDER_SITE_OTHER): Payer: Medicare Other | Admitting: Ophthalmology

## 2019-07-04 ENCOUNTER — Other Ambulatory Visit: Payer: Self-pay

## 2019-07-04 DIAGNOSIS — H35033 Hypertensive retinopathy, bilateral: Secondary | ICD-10-CM

## 2019-07-04 DIAGNOSIS — H353211 Exudative age-related macular degeneration, right eye, with active choroidal neovascularization: Secondary | ICD-10-CM

## 2019-07-04 DIAGNOSIS — I10 Essential (primary) hypertension: Secondary | ICD-10-CM | POA: Diagnosis not present

## 2019-07-04 DIAGNOSIS — H353122 Nonexudative age-related macular degeneration, left eye, intermediate dry stage: Secondary | ICD-10-CM

## 2019-07-04 DIAGNOSIS — H43813 Vitreous degeneration, bilateral: Secondary | ICD-10-CM

## 2019-07-17 DIAGNOSIS — Z23 Encounter for immunization: Secondary | ICD-10-CM | POA: Diagnosis not present

## 2019-08-01 ENCOUNTER — Encounter (INDEPENDENT_AMBULATORY_CARE_PROVIDER_SITE_OTHER): Payer: Medicare Other | Admitting: Ophthalmology

## 2019-08-01 ENCOUNTER — Other Ambulatory Visit: Payer: Self-pay

## 2019-08-01 DIAGNOSIS — H353122 Nonexudative age-related macular degeneration, left eye, intermediate dry stage: Secondary | ICD-10-CM

## 2019-08-01 DIAGNOSIS — H353211 Exudative age-related macular degeneration, right eye, with active choroidal neovascularization: Secondary | ICD-10-CM | POA: Diagnosis not present

## 2019-08-01 DIAGNOSIS — H43813 Vitreous degeneration, bilateral: Secondary | ICD-10-CM | POA: Diagnosis not present

## 2019-08-01 DIAGNOSIS — H35033 Hypertensive retinopathy, bilateral: Secondary | ICD-10-CM

## 2019-08-01 DIAGNOSIS — I10 Essential (primary) hypertension: Secondary | ICD-10-CM | POA: Diagnosis not present

## 2019-08-08 DIAGNOSIS — M79676 Pain in unspecified toe(s): Secondary | ICD-10-CM | POA: Diagnosis not present

## 2019-08-08 DIAGNOSIS — B351 Tinea unguium: Secondary | ICD-10-CM | POA: Diagnosis not present

## 2019-08-09 DIAGNOSIS — Z23 Encounter for immunization: Secondary | ICD-10-CM | POA: Diagnosis not present

## 2019-09-04 ENCOUNTER — Other Ambulatory Visit: Payer: Self-pay

## 2019-09-04 ENCOUNTER — Encounter (INDEPENDENT_AMBULATORY_CARE_PROVIDER_SITE_OTHER): Payer: Medicare Other | Admitting: Ophthalmology

## 2019-09-04 DIAGNOSIS — H353211 Exudative age-related macular degeneration, right eye, with active choroidal neovascularization: Secondary | ICD-10-CM | POA: Diagnosis not present

## 2019-09-04 DIAGNOSIS — H35033 Hypertensive retinopathy, bilateral: Secondary | ICD-10-CM

## 2019-09-04 DIAGNOSIS — I10 Essential (primary) hypertension: Secondary | ICD-10-CM | POA: Diagnosis not present

## 2019-09-04 DIAGNOSIS — H353122 Nonexudative age-related macular degeneration, left eye, intermediate dry stage: Secondary | ICD-10-CM

## 2019-09-04 DIAGNOSIS — H43813 Vitreous degeneration, bilateral: Secondary | ICD-10-CM | POA: Diagnosis not present

## 2019-09-12 DIAGNOSIS — C61 Malignant neoplasm of prostate: Secondary | ICD-10-CM | POA: Diagnosis not present

## 2019-10-02 ENCOUNTER — Other Ambulatory Visit: Payer: Self-pay

## 2019-10-02 ENCOUNTER — Encounter (INDEPENDENT_AMBULATORY_CARE_PROVIDER_SITE_OTHER): Payer: Medicare Other | Admitting: Ophthalmology

## 2019-10-02 DIAGNOSIS — I10 Essential (primary) hypertension: Secondary | ICD-10-CM | POA: Diagnosis not present

## 2019-10-02 DIAGNOSIS — H353211 Exudative age-related macular degeneration, right eye, with active choroidal neovascularization: Secondary | ICD-10-CM

## 2019-10-02 DIAGNOSIS — H35033 Hypertensive retinopathy, bilateral: Secondary | ICD-10-CM | POA: Diagnosis not present

## 2019-10-02 DIAGNOSIS — H353122 Nonexudative age-related macular degeneration, left eye, intermediate dry stage: Secondary | ICD-10-CM

## 2019-10-02 DIAGNOSIS — H43813 Vitreous degeneration, bilateral: Secondary | ICD-10-CM

## 2019-10-23 DIAGNOSIS — R609 Edema, unspecified: Secondary | ICD-10-CM | POA: Diagnosis not present

## 2019-10-23 DIAGNOSIS — E7800 Pure hypercholesterolemia, unspecified: Secondary | ICD-10-CM | POA: Diagnosis not present

## 2019-10-23 DIAGNOSIS — H3530 Unspecified macular degeneration: Secondary | ICD-10-CM | POA: Diagnosis not present

## 2019-10-23 DIAGNOSIS — C61 Malignant neoplasm of prostate: Secondary | ICD-10-CM | POA: Diagnosis not present

## 2019-10-23 DIAGNOSIS — I10 Essential (primary) hypertension: Secondary | ICD-10-CM | POA: Diagnosis not present

## 2019-10-23 DIAGNOSIS — R52 Pain, unspecified: Secondary | ICD-10-CM | POA: Diagnosis not present

## 2019-10-23 DIAGNOSIS — R531 Weakness: Secondary | ICD-10-CM | POA: Diagnosis not present

## 2019-10-24 DIAGNOSIS — E7800 Pure hypercholesterolemia, unspecified: Secondary | ICD-10-CM | POA: Diagnosis not present

## 2019-10-24 DIAGNOSIS — I10 Essential (primary) hypertension: Secondary | ICD-10-CM | POA: Diagnosis not present

## 2019-10-24 DIAGNOSIS — R531 Weakness: Secondary | ICD-10-CM | POA: Diagnosis not present

## 2019-10-24 DIAGNOSIS — R52 Pain, unspecified: Secondary | ICD-10-CM | POA: Diagnosis not present

## 2019-10-24 DIAGNOSIS — C61 Malignant neoplasm of prostate: Secondary | ICD-10-CM | POA: Diagnosis not present

## 2019-10-24 DIAGNOSIS — H3530 Unspecified macular degeneration: Secondary | ICD-10-CM | POA: Diagnosis not present

## 2019-10-25 DIAGNOSIS — E7800 Pure hypercholesterolemia, unspecified: Secondary | ICD-10-CM | POA: Diagnosis not present

## 2019-10-25 DIAGNOSIS — I10 Essential (primary) hypertension: Secondary | ICD-10-CM | POA: Diagnosis not present

## 2019-10-25 DIAGNOSIS — C61 Malignant neoplasm of prostate: Secondary | ICD-10-CM | POA: Diagnosis not present

## 2019-10-25 DIAGNOSIS — R52 Pain, unspecified: Secondary | ICD-10-CM | POA: Diagnosis not present

## 2019-10-25 DIAGNOSIS — R531 Weakness: Secondary | ICD-10-CM | POA: Diagnosis not present

## 2019-10-25 DIAGNOSIS — H3530 Unspecified macular degeneration: Secondary | ICD-10-CM | POA: Diagnosis not present

## 2019-10-27 DIAGNOSIS — E7800 Pure hypercholesterolemia, unspecified: Secondary | ICD-10-CM | POA: Diagnosis not present

## 2019-10-27 DIAGNOSIS — I10 Essential (primary) hypertension: Secondary | ICD-10-CM | POA: Diagnosis not present

## 2019-10-27 DIAGNOSIS — R52 Pain, unspecified: Secondary | ICD-10-CM | POA: Diagnosis not present

## 2019-10-27 DIAGNOSIS — C61 Malignant neoplasm of prostate: Secondary | ICD-10-CM | POA: Diagnosis not present

## 2019-10-27 DIAGNOSIS — R531 Weakness: Secondary | ICD-10-CM | POA: Diagnosis not present

## 2019-10-27 DIAGNOSIS — R609 Edema, unspecified: Secondary | ICD-10-CM | POA: Diagnosis not present

## 2019-10-27 DIAGNOSIS — H3530 Unspecified macular degeneration: Secondary | ICD-10-CM | POA: Diagnosis not present

## 2019-10-28 DIAGNOSIS — R531 Weakness: Secondary | ICD-10-CM | POA: Diagnosis not present

## 2019-10-28 DIAGNOSIS — E7800 Pure hypercholesterolemia, unspecified: Secondary | ICD-10-CM | POA: Diagnosis not present

## 2019-10-28 DIAGNOSIS — R52 Pain, unspecified: Secondary | ICD-10-CM | POA: Diagnosis not present

## 2019-10-28 DIAGNOSIS — I10 Essential (primary) hypertension: Secondary | ICD-10-CM | POA: Diagnosis not present

## 2019-10-28 DIAGNOSIS — C61 Malignant neoplasm of prostate: Secondary | ICD-10-CM | POA: Diagnosis not present

## 2019-10-28 DIAGNOSIS — H3530 Unspecified macular degeneration: Secondary | ICD-10-CM | POA: Diagnosis not present

## 2019-10-30 DIAGNOSIS — R52 Pain, unspecified: Secondary | ICD-10-CM | POA: Diagnosis not present

## 2019-10-30 DIAGNOSIS — E7800 Pure hypercholesterolemia, unspecified: Secondary | ICD-10-CM | POA: Diagnosis not present

## 2019-10-30 DIAGNOSIS — C61 Malignant neoplasm of prostate: Secondary | ICD-10-CM | POA: Diagnosis not present

## 2019-10-30 DIAGNOSIS — I10 Essential (primary) hypertension: Secondary | ICD-10-CM | POA: Diagnosis not present

## 2019-10-30 DIAGNOSIS — R531 Weakness: Secondary | ICD-10-CM | POA: Diagnosis not present

## 2019-10-30 DIAGNOSIS — H3530 Unspecified macular degeneration: Secondary | ICD-10-CM | POA: Diagnosis not present

## 2019-10-31 DIAGNOSIS — L821 Other seborrheic keratosis: Secondary | ICD-10-CM | POA: Diagnosis not present

## 2019-10-31 DIAGNOSIS — D225 Melanocytic nevi of trunk: Secondary | ICD-10-CM | POA: Diagnosis not present

## 2019-10-31 DIAGNOSIS — I8310 Varicose veins of unspecified lower extremity with inflammation: Secondary | ICD-10-CM | POA: Diagnosis not present

## 2019-11-01 DIAGNOSIS — H3530 Unspecified macular degeneration: Secondary | ICD-10-CM | POA: Diagnosis not present

## 2019-11-01 DIAGNOSIS — R531 Weakness: Secondary | ICD-10-CM | POA: Diagnosis not present

## 2019-11-01 DIAGNOSIS — I10 Essential (primary) hypertension: Secondary | ICD-10-CM | POA: Diagnosis not present

## 2019-11-01 DIAGNOSIS — C61 Malignant neoplasm of prostate: Secondary | ICD-10-CM | POA: Diagnosis not present

## 2019-11-01 DIAGNOSIS — R52 Pain, unspecified: Secondary | ICD-10-CM | POA: Diagnosis not present

## 2019-11-01 DIAGNOSIS — E7800 Pure hypercholesterolemia, unspecified: Secondary | ICD-10-CM | POA: Diagnosis not present

## 2019-11-04 DIAGNOSIS — C61 Malignant neoplasm of prostate: Secondary | ICD-10-CM | POA: Diagnosis not present

## 2019-11-04 DIAGNOSIS — H3530 Unspecified macular degeneration: Secondary | ICD-10-CM | POA: Diagnosis not present

## 2019-11-04 DIAGNOSIS — E7800 Pure hypercholesterolemia, unspecified: Secondary | ICD-10-CM | POA: Diagnosis not present

## 2019-11-04 DIAGNOSIS — I10 Essential (primary) hypertension: Secondary | ICD-10-CM | POA: Diagnosis not present

## 2019-11-04 DIAGNOSIS — R531 Weakness: Secondary | ICD-10-CM | POA: Diagnosis not present

## 2019-11-04 DIAGNOSIS — R52 Pain, unspecified: Secondary | ICD-10-CM | POA: Diagnosis not present

## 2019-11-06 ENCOUNTER — Encounter (INDEPENDENT_AMBULATORY_CARE_PROVIDER_SITE_OTHER): Payer: Medicare Other | Admitting: Ophthalmology

## 2019-11-06 ENCOUNTER — Other Ambulatory Visit: Payer: Self-pay

## 2019-11-06 DIAGNOSIS — H353211 Exudative age-related macular degeneration, right eye, with active choroidal neovascularization: Secondary | ICD-10-CM

## 2019-11-06 DIAGNOSIS — H35033 Hypertensive retinopathy, bilateral: Secondary | ICD-10-CM

## 2019-11-06 DIAGNOSIS — I10 Essential (primary) hypertension: Secondary | ICD-10-CM | POA: Diagnosis not present

## 2019-11-06 DIAGNOSIS — H353122 Nonexudative age-related macular degeneration, left eye, intermediate dry stage: Secondary | ICD-10-CM | POA: Diagnosis not present

## 2019-11-06 DIAGNOSIS — H43813 Vitreous degeneration, bilateral: Secondary | ICD-10-CM | POA: Diagnosis not present

## 2019-11-07 DIAGNOSIS — I10 Essential (primary) hypertension: Secondary | ICD-10-CM | POA: Diagnosis not present

## 2019-11-07 DIAGNOSIS — C61 Malignant neoplasm of prostate: Secondary | ICD-10-CM | POA: Diagnosis not present

## 2019-11-07 DIAGNOSIS — E7800 Pure hypercholesterolemia, unspecified: Secondary | ICD-10-CM | POA: Diagnosis not present

## 2019-11-07 DIAGNOSIS — R52 Pain, unspecified: Secondary | ICD-10-CM | POA: Diagnosis not present

## 2019-11-07 DIAGNOSIS — R531 Weakness: Secondary | ICD-10-CM | POA: Diagnosis not present

## 2019-11-07 DIAGNOSIS — H3530 Unspecified macular degeneration: Secondary | ICD-10-CM | POA: Diagnosis not present

## 2019-11-08 DIAGNOSIS — C61 Malignant neoplasm of prostate: Secondary | ICD-10-CM | POA: Diagnosis not present

## 2019-11-08 DIAGNOSIS — R531 Weakness: Secondary | ICD-10-CM | POA: Diagnosis not present

## 2019-11-08 DIAGNOSIS — R52 Pain, unspecified: Secondary | ICD-10-CM | POA: Diagnosis not present

## 2019-11-08 DIAGNOSIS — H3530 Unspecified macular degeneration: Secondary | ICD-10-CM | POA: Diagnosis not present

## 2019-11-08 DIAGNOSIS — I10 Essential (primary) hypertension: Secondary | ICD-10-CM | POA: Diagnosis not present

## 2019-11-08 DIAGNOSIS — E7800 Pure hypercholesterolemia, unspecified: Secondary | ICD-10-CM | POA: Diagnosis not present

## 2019-11-11 DIAGNOSIS — C61 Malignant neoplasm of prostate: Secondary | ICD-10-CM | POA: Diagnosis not present

## 2019-11-11 DIAGNOSIS — R531 Weakness: Secondary | ICD-10-CM | POA: Diagnosis not present

## 2019-11-11 DIAGNOSIS — I10 Essential (primary) hypertension: Secondary | ICD-10-CM | POA: Diagnosis not present

## 2019-11-11 DIAGNOSIS — H3530 Unspecified macular degeneration: Secondary | ICD-10-CM | POA: Diagnosis not present

## 2019-11-11 DIAGNOSIS — E7800 Pure hypercholesterolemia, unspecified: Secondary | ICD-10-CM | POA: Diagnosis not present

## 2019-11-11 DIAGNOSIS — R52 Pain, unspecified: Secondary | ICD-10-CM | POA: Diagnosis not present

## 2019-11-13 DIAGNOSIS — R52 Pain, unspecified: Secondary | ICD-10-CM | POA: Diagnosis not present

## 2019-11-13 DIAGNOSIS — H3530 Unspecified macular degeneration: Secondary | ICD-10-CM | POA: Diagnosis not present

## 2019-11-13 DIAGNOSIS — C61 Malignant neoplasm of prostate: Secondary | ICD-10-CM | POA: Diagnosis not present

## 2019-11-13 DIAGNOSIS — I10 Essential (primary) hypertension: Secondary | ICD-10-CM | POA: Diagnosis not present

## 2019-11-13 DIAGNOSIS — E7800 Pure hypercholesterolemia, unspecified: Secondary | ICD-10-CM | POA: Diagnosis not present

## 2019-11-13 DIAGNOSIS — R531 Weakness: Secondary | ICD-10-CM | POA: Diagnosis not present

## 2019-11-15 DIAGNOSIS — I10 Essential (primary) hypertension: Secondary | ICD-10-CM | POA: Diagnosis not present

## 2019-11-15 DIAGNOSIS — E7800 Pure hypercholesterolemia, unspecified: Secondary | ICD-10-CM | POA: Diagnosis not present

## 2019-11-15 DIAGNOSIS — C61 Malignant neoplasm of prostate: Secondary | ICD-10-CM | POA: Diagnosis not present

## 2019-11-15 DIAGNOSIS — H3530 Unspecified macular degeneration: Secondary | ICD-10-CM | POA: Diagnosis not present

## 2019-11-15 DIAGNOSIS — R531 Weakness: Secondary | ICD-10-CM | POA: Diagnosis not present

## 2019-11-15 DIAGNOSIS — R52 Pain, unspecified: Secondary | ICD-10-CM | POA: Diagnosis not present

## 2019-11-18 DIAGNOSIS — R52 Pain, unspecified: Secondary | ICD-10-CM | POA: Diagnosis not present

## 2019-11-18 DIAGNOSIS — I10 Essential (primary) hypertension: Secondary | ICD-10-CM | POA: Diagnosis not present

## 2019-11-18 DIAGNOSIS — C61 Malignant neoplasm of prostate: Secondary | ICD-10-CM | POA: Diagnosis not present

## 2019-11-18 DIAGNOSIS — E7800 Pure hypercholesterolemia, unspecified: Secondary | ICD-10-CM | POA: Diagnosis not present

## 2019-11-18 DIAGNOSIS — H3530 Unspecified macular degeneration: Secondary | ICD-10-CM | POA: Diagnosis not present

## 2019-11-18 DIAGNOSIS — R531 Weakness: Secondary | ICD-10-CM | POA: Diagnosis not present

## 2019-11-20 DIAGNOSIS — R531 Weakness: Secondary | ICD-10-CM | POA: Diagnosis not present

## 2019-11-20 DIAGNOSIS — R52 Pain, unspecified: Secondary | ICD-10-CM | POA: Diagnosis not present

## 2019-11-20 DIAGNOSIS — H3530 Unspecified macular degeneration: Secondary | ICD-10-CM | POA: Diagnosis not present

## 2019-11-20 DIAGNOSIS — C61 Malignant neoplasm of prostate: Secondary | ICD-10-CM | POA: Diagnosis not present

## 2019-11-20 DIAGNOSIS — E7800 Pure hypercholesterolemia, unspecified: Secondary | ICD-10-CM | POA: Diagnosis not present

## 2019-11-20 DIAGNOSIS — I10 Essential (primary) hypertension: Secondary | ICD-10-CM | POA: Diagnosis not present

## 2019-11-22 DIAGNOSIS — R52 Pain, unspecified: Secondary | ICD-10-CM | POA: Diagnosis not present

## 2019-11-22 DIAGNOSIS — C61 Malignant neoplasm of prostate: Secondary | ICD-10-CM | POA: Diagnosis not present

## 2019-11-22 DIAGNOSIS — I10 Essential (primary) hypertension: Secondary | ICD-10-CM | POA: Diagnosis not present

## 2019-11-22 DIAGNOSIS — R531 Weakness: Secondary | ICD-10-CM | POA: Diagnosis not present

## 2019-11-22 DIAGNOSIS — H3530 Unspecified macular degeneration: Secondary | ICD-10-CM | POA: Diagnosis not present

## 2019-11-22 DIAGNOSIS — E7800 Pure hypercholesterolemia, unspecified: Secondary | ICD-10-CM | POA: Diagnosis not present

## 2019-11-25 DIAGNOSIS — I10 Essential (primary) hypertension: Secondary | ICD-10-CM | POA: Diagnosis not present

## 2019-11-25 DIAGNOSIS — R531 Weakness: Secondary | ICD-10-CM | POA: Diagnosis not present

## 2019-11-25 DIAGNOSIS — H3530 Unspecified macular degeneration: Secondary | ICD-10-CM | POA: Diagnosis not present

## 2019-11-25 DIAGNOSIS — R52 Pain, unspecified: Secondary | ICD-10-CM | POA: Diagnosis not present

## 2019-11-25 DIAGNOSIS — E7800 Pure hypercholesterolemia, unspecified: Secondary | ICD-10-CM | POA: Diagnosis not present

## 2019-11-25 DIAGNOSIS — C61 Malignant neoplasm of prostate: Secondary | ICD-10-CM | POA: Diagnosis not present

## 2019-11-27 DIAGNOSIS — C61 Malignant neoplasm of prostate: Secondary | ICD-10-CM | POA: Diagnosis not present

## 2019-11-27 DIAGNOSIS — R609 Edema, unspecified: Secondary | ICD-10-CM | POA: Diagnosis not present

## 2019-11-27 DIAGNOSIS — H3530 Unspecified macular degeneration: Secondary | ICD-10-CM | POA: Diagnosis not present

## 2019-11-27 DIAGNOSIS — E7800 Pure hypercholesterolemia, unspecified: Secondary | ICD-10-CM | POA: Diagnosis not present

## 2019-11-27 DIAGNOSIS — R531 Weakness: Secondary | ICD-10-CM | POA: Diagnosis not present

## 2019-11-27 DIAGNOSIS — I10 Essential (primary) hypertension: Secondary | ICD-10-CM | POA: Diagnosis not present

## 2019-11-27 DIAGNOSIS — R52 Pain, unspecified: Secondary | ICD-10-CM | POA: Diagnosis not present

## 2019-11-29 DIAGNOSIS — R531 Weakness: Secondary | ICD-10-CM | POA: Diagnosis not present

## 2019-11-29 DIAGNOSIS — I10 Essential (primary) hypertension: Secondary | ICD-10-CM | POA: Diagnosis not present

## 2019-11-29 DIAGNOSIS — C61 Malignant neoplasm of prostate: Secondary | ICD-10-CM | POA: Diagnosis not present

## 2019-11-29 DIAGNOSIS — H3530 Unspecified macular degeneration: Secondary | ICD-10-CM | POA: Diagnosis not present

## 2019-11-29 DIAGNOSIS — E7800 Pure hypercholesterolemia, unspecified: Secondary | ICD-10-CM | POA: Diagnosis not present

## 2019-11-29 DIAGNOSIS — R52 Pain, unspecified: Secondary | ICD-10-CM | POA: Diagnosis not present

## 2019-12-04 ENCOUNTER — Other Ambulatory Visit: Payer: Self-pay

## 2019-12-04 ENCOUNTER — Encounter (INDEPENDENT_AMBULATORY_CARE_PROVIDER_SITE_OTHER): Admitting: Ophthalmology

## 2019-12-04 DIAGNOSIS — H3530 Unspecified macular degeneration: Secondary | ICD-10-CM | POA: Diagnosis not present

## 2019-12-04 DIAGNOSIS — H353211 Exudative age-related macular degeneration, right eye, with active choroidal neovascularization: Secondary | ICD-10-CM | POA: Diagnosis not present

## 2019-12-04 DIAGNOSIS — C61 Malignant neoplasm of prostate: Secondary | ICD-10-CM | POA: Diagnosis not present

## 2019-12-04 DIAGNOSIS — H353122 Nonexudative age-related macular degeneration, left eye, intermediate dry stage: Secondary | ICD-10-CM | POA: Diagnosis not present

## 2019-12-04 DIAGNOSIS — R52 Pain, unspecified: Secondary | ICD-10-CM | POA: Diagnosis not present

## 2019-12-04 DIAGNOSIS — I10 Essential (primary) hypertension: Secondary | ICD-10-CM

## 2019-12-04 DIAGNOSIS — E7800 Pure hypercholesterolemia, unspecified: Secondary | ICD-10-CM | POA: Diagnosis not present

## 2019-12-04 DIAGNOSIS — H43813 Vitreous degeneration, bilateral: Secondary | ICD-10-CM | POA: Diagnosis not present

## 2019-12-04 DIAGNOSIS — H35033 Hypertensive retinopathy, bilateral: Secondary | ICD-10-CM

## 2019-12-04 DIAGNOSIS — R531 Weakness: Secondary | ICD-10-CM | POA: Diagnosis not present

## 2019-12-05 DIAGNOSIS — H3530 Unspecified macular degeneration: Secondary | ICD-10-CM | POA: Diagnosis not present

## 2019-12-05 DIAGNOSIS — C61 Malignant neoplasm of prostate: Secondary | ICD-10-CM | POA: Diagnosis not present

## 2019-12-05 DIAGNOSIS — E7800 Pure hypercholesterolemia, unspecified: Secondary | ICD-10-CM | POA: Diagnosis not present

## 2019-12-05 DIAGNOSIS — I10 Essential (primary) hypertension: Secondary | ICD-10-CM | POA: Diagnosis not present

## 2019-12-05 DIAGNOSIS — R531 Weakness: Secondary | ICD-10-CM | POA: Diagnosis not present

## 2019-12-05 DIAGNOSIS — R52 Pain, unspecified: Secondary | ICD-10-CM | POA: Diagnosis not present

## 2019-12-06 DIAGNOSIS — H3530 Unspecified macular degeneration: Secondary | ICD-10-CM | POA: Diagnosis not present

## 2019-12-06 DIAGNOSIS — C61 Malignant neoplasm of prostate: Secondary | ICD-10-CM | POA: Diagnosis not present

## 2019-12-06 DIAGNOSIS — R52 Pain, unspecified: Secondary | ICD-10-CM | POA: Diagnosis not present

## 2019-12-06 DIAGNOSIS — I10 Essential (primary) hypertension: Secondary | ICD-10-CM | POA: Diagnosis not present

## 2019-12-06 DIAGNOSIS — R531 Weakness: Secondary | ICD-10-CM | POA: Diagnosis not present

## 2019-12-06 DIAGNOSIS — E7800 Pure hypercholesterolemia, unspecified: Secondary | ICD-10-CM | POA: Diagnosis not present

## 2019-12-09 DIAGNOSIS — R52 Pain, unspecified: Secondary | ICD-10-CM | POA: Diagnosis not present

## 2019-12-09 DIAGNOSIS — C61 Malignant neoplasm of prostate: Secondary | ICD-10-CM | POA: Diagnosis not present

## 2019-12-09 DIAGNOSIS — R531 Weakness: Secondary | ICD-10-CM | POA: Diagnosis not present

## 2019-12-09 DIAGNOSIS — E7800 Pure hypercholesterolemia, unspecified: Secondary | ICD-10-CM | POA: Diagnosis not present

## 2019-12-09 DIAGNOSIS — I10 Essential (primary) hypertension: Secondary | ICD-10-CM | POA: Diagnosis not present

## 2019-12-09 DIAGNOSIS — H3530 Unspecified macular degeneration: Secondary | ICD-10-CM | POA: Diagnosis not present

## 2019-12-11 DIAGNOSIS — C61 Malignant neoplasm of prostate: Secondary | ICD-10-CM | POA: Diagnosis not present

## 2019-12-11 DIAGNOSIS — E7800 Pure hypercholesterolemia, unspecified: Secondary | ICD-10-CM | POA: Diagnosis not present

## 2019-12-11 DIAGNOSIS — I10 Essential (primary) hypertension: Secondary | ICD-10-CM | POA: Diagnosis not present

## 2019-12-11 DIAGNOSIS — R52 Pain, unspecified: Secondary | ICD-10-CM | POA: Diagnosis not present

## 2019-12-11 DIAGNOSIS — R531 Weakness: Secondary | ICD-10-CM | POA: Diagnosis not present

## 2019-12-11 DIAGNOSIS — H3530 Unspecified macular degeneration: Secondary | ICD-10-CM | POA: Diagnosis not present

## 2019-12-13 DIAGNOSIS — E7800 Pure hypercholesterolemia, unspecified: Secondary | ICD-10-CM | POA: Diagnosis not present

## 2019-12-13 DIAGNOSIS — R531 Weakness: Secondary | ICD-10-CM | POA: Diagnosis not present

## 2019-12-13 DIAGNOSIS — H3530 Unspecified macular degeneration: Secondary | ICD-10-CM | POA: Diagnosis not present

## 2019-12-13 DIAGNOSIS — C61 Malignant neoplasm of prostate: Secondary | ICD-10-CM | POA: Diagnosis not present

## 2019-12-13 DIAGNOSIS — R52 Pain, unspecified: Secondary | ICD-10-CM | POA: Diagnosis not present

## 2019-12-13 DIAGNOSIS — I10 Essential (primary) hypertension: Secondary | ICD-10-CM | POA: Diagnosis not present

## 2019-12-16 DIAGNOSIS — E7800 Pure hypercholesterolemia, unspecified: Secondary | ICD-10-CM | POA: Diagnosis not present

## 2019-12-16 DIAGNOSIS — R531 Weakness: Secondary | ICD-10-CM | POA: Diagnosis not present

## 2019-12-16 DIAGNOSIS — I10 Essential (primary) hypertension: Secondary | ICD-10-CM | POA: Diagnosis not present

## 2019-12-16 DIAGNOSIS — C61 Malignant neoplasm of prostate: Secondary | ICD-10-CM | POA: Diagnosis not present

## 2019-12-16 DIAGNOSIS — H3530 Unspecified macular degeneration: Secondary | ICD-10-CM | POA: Diagnosis not present

## 2019-12-16 DIAGNOSIS — R52 Pain, unspecified: Secondary | ICD-10-CM | POA: Diagnosis not present

## 2019-12-18 DIAGNOSIS — H3530 Unspecified macular degeneration: Secondary | ICD-10-CM | POA: Diagnosis not present

## 2019-12-18 DIAGNOSIS — E7800 Pure hypercholesterolemia, unspecified: Secondary | ICD-10-CM | POA: Diagnosis not present

## 2019-12-18 DIAGNOSIS — I10 Essential (primary) hypertension: Secondary | ICD-10-CM | POA: Diagnosis not present

## 2019-12-18 DIAGNOSIS — R52 Pain, unspecified: Secondary | ICD-10-CM | POA: Diagnosis not present

## 2019-12-18 DIAGNOSIS — R531 Weakness: Secondary | ICD-10-CM | POA: Diagnosis not present

## 2019-12-18 DIAGNOSIS — C61 Malignant neoplasm of prostate: Secondary | ICD-10-CM | POA: Diagnosis not present

## 2019-12-20 DIAGNOSIS — C61 Malignant neoplasm of prostate: Secondary | ICD-10-CM | POA: Diagnosis not present

## 2019-12-20 DIAGNOSIS — R52 Pain, unspecified: Secondary | ICD-10-CM | POA: Diagnosis not present

## 2019-12-20 DIAGNOSIS — I10 Essential (primary) hypertension: Secondary | ICD-10-CM | POA: Diagnosis not present

## 2019-12-20 DIAGNOSIS — H3530 Unspecified macular degeneration: Secondary | ICD-10-CM | POA: Diagnosis not present

## 2019-12-20 DIAGNOSIS — R531 Weakness: Secondary | ICD-10-CM | POA: Diagnosis not present

## 2019-12-20 DIAGNOSIS — E7800 Pure hypercholesterolemia, unspecified: Secondary | ICD-10-CM | POA: Diagnosis not present

## 2019-12-23 DIAGNOSIS — C61 Malignant neoplasm of prostate: Secondary | ICD-10-CM | POA: Diagnosis not present

## 2019-12-23 DIAGNOSIS — R52 Pain, unspecified: Secondary | ICD-10-CM | POA: Diagnosis not present

## 2019-12-23 DIAGNOSIS — R531 Weakness: Secondary | ICD-10-CM | POA: Diagnosis not present

## 2019-12-23 DIAGNOSIS — E7800 Pure hypercholesterolemia, unspecified: Secondary | ICD-10-CM | POA: Diagnosis not present

## 2019-12-23 DIAGNOSIS — H3530 Unspecified macular degeneration: Secondary | ICD-10-CM | POA: Diagnosis not present

## 2019-12-23 DIAGNOSIS — I10 Essential (primary) hypertension: Secondary | ICD-10-CM | POA: Diagnosis not present

## 2019-12-25 DIAGNOSIS — I10 Essential (primary) hypertension: Secondary | ICD-10-CM | POA: Diagnosis not present

## 2019-12-25 DIAGNOSIS — H3530 Unspecified macular degeneration: Secondary | ICD-10-CM | POA: Diagnosis not present

## 2019-12-25 DIAGNOSIS — R52 Pain, unspecified: Secondary | ICD-10-CM | POA: Diagnosis not present

## 2019-12-25 DIAGNOSIS — E7800 Pure hypercholesterolemia, unspecified: Secondary | ICD-10-CM | POA: Diagnosis not present

## 2019-12-25 DIAGNOSIS — R531 Weakness: Secondary | ICD-10-CM | POA: Diagnosis not present

## 2019-12-25 DIAGNOSIS — C61 Malignant neoplasm of prostate: Secondary | ICD-10-CM | POA: Diagnosis not present

## 2019-12-27 DIAGNOSIS — C61 Malignant neoplasm of prostate: Secondary | ICD-10-CM | POA: Diagnosis not present

## 2019-12-27 DIAGNOSIS — I10 Essential (primary) hypertension: Secondary | ICD-10-CM | POA: Diagnosis not present

## 2019-12-27 DIAGNOSIS — E7800 Pure hypercholesterolemia, unspecified: Secondary | ICD-10-CM | POA: Diagnosis not present

## 2019-12-27 DIAGNOSIS — R531 Weakness: Secondary | ICD-10-CM | POA: Diagnosis not present

## 2019-12-27 DIAGNOSIS — H3530 Unspecified macular degeneration: Secondary | ICD-10-CM | POA: Diagnosis not present

## 2019-12-27 DIAGNOSIS — R52 Pain, unspecified: Secondary | ICD-10-CM | POA: Diagnosis not present

## 2019-12-27 DIAGNOSIS — R609 Edema, unspecified: Secondary | ICD-10-CM | POA: Diagnosis not present

## 2019-12-30 DIAGNOSIS — C61 Malignant neoplasm of prostate: Secondary | ICD-10-CM | POA: Diagnosis not present

## 2019-12-30 DIAGNOSIS — E7800 Pure hypercholesterolemia, unspecified: Secondary | ICD-10-CM | POA: Diagnosis not present

## 2019-12-30 DIAGNOSIS — R531 Weakness: Secondary | ICD-10-CM | POA: Diagnosis not present

## 2019-12-30 DIAGNOSIS — H3530 Unspecified macular degeneration: Secondary | ICD-10-CM | POA: Diagnosis not present

## 2019-12-30 DIAGNOSIS — R52 Pain, unspecified: Secondary | ICD-10-CM | POA: Diagnosis not present

## 2019-12-30 DIAGNOSIS — I10 Essential (primary) hypertension: Secondary | ICD-10-CM | POA: Diagnosis not present

## 2019-12-31 ENCOUNTER — Encounter (INDEPENDENT_AMBULATORY_CARE_PROVIDER_SITE_OTHER): Payer: Medicare Other | Admitting: Ophthalmology

## 2019-12-31 ENCOUNTER — Other Ambulatory Visit: Payer: Self-pay

## 2019-12-31 DIAGNOSIS — H43813 Vitreous degeneration, bilateral: Secondary | ICD-10-CM

## 2019-12-31 DIAGNOSIS — C61 Malignant neoplasm of prostate: Secondary | ICD-10-CM | POA: Diagnosis not present

## 2019-12-31 DIAGNOSIS — H353122 Nonexudative age-related macular degeneration, left eye, intermediate dry stage: Secondary | ICD-10-CM | POA: Diagnosis not present

## 2019-12-31 DIAGNOSIS — I10 Essential (primary) hypertension: Secondary | ICD-10-CM | POA: Diagnosis not present

## 2019-12-31 DIAGNOSIS — H35033 Hypertensive retinopathy, bilateral: Secondary | ICD-10-CM | POA: Diagnosis not present

## 2019-12-31 DIAGNOSIS — H353211 Exudative age-related macular degeneration, right eye, with active choroidal neovascularization: Secondary | ICD-10-CM

## 2020-01-03 DIAGNOSIS — I10 Essential (primary) hypertension: Secondary | ICD-10-CM | POA: Diagnosis not present

## 2020-01-03 DIAGNOSIS — E7800 Pure hypercholesterolemia, unspecified: Secondary | ICD-10-CM | POA: Diagnosis not present

## 2020-01-03 DIAGNOSIS — C61 Malignant neoplasm of prostate: Secondary | ICD-10-CM | POA: Diagnosis not present

## 2020-01-03 DIAGNOSIS — H3530 Unspecified macular degeneration: Secondary | ICD-10-CM | POA: Diagnosis not present

## 2020-01-03 DIAGNOSIS — R52 Pain, unspecified: Secondary | ICD-10-CM | POA: Diagnosis not present

## 2020-01-03 DIAGNOSIS — R531 Weakness: Secondary | ICD-10-CM | POA: Diagnosis not present

## 2020-01-06 DIAGNOSIS — H3530 Unspecified macular degeneration: Secondary | ICD-10-CM | POA: Diagnosis not present

## 2020-01-06 DIAGNOSIS — R52 Pain, unspecified: Secondary | ICD-10-CM | POA: Diagnosis not present

## 2020-01-06 DIAGNOSIS — R531 Weakness: Secondary | ICD-10-CM | POA: Diagnosis not present

## 2020-01-06 DIAGNOSIS — I10 Essential (primary) hypertension: Secondary | ICD-10-CM | POA: Diagnosis not present

## 2020-01-06 DIAGNOSIS — C61 Malignant neoplasm of prostate: Secondary | ICD-10-CM | POA: Diagnosis not present

## 2020-01-06 DIAGNOSIS — E7800 Pure hypercholesterolemia, unspecified: Secondary | ICD-10-CM | POA: Diagnosis not present

## 2020-01-10 DIAGNOSIS — C61 Malignant neoplasm of prostate: Secondary | ICD-10-CM | POA: Diagnosis not present

## 2020-01-13 DIAGNOSIS — H3530 Unspecified macular degeneration: Secondary | ICD-10-CM | POA: Diagnosis not present

## 2020-01-13 DIAGNOSIS — R531 Weakness: Secondary | ICD-10-CM | POA: Diagnosis not present

## 2020-01-13 DIAGNOSIS — C61 Malignant neoplasm of prostate: Secondary | ICD-10-CM | POA: Diagnosis not present

## 2020-01-13 DIAGNOSIS — I10 Essential (primary) hypertension: Secondary | ICD-10-CM | POA: Diagnosis not present

## 2020-01-13 DIAGNOSIS — E7800 Pure hypercholesterolemia, unspecified: Secondary | ICD-10-CM | POA: Diagnosis not present

## 2020-01-13 DIAGNOSIS — R52 Pain, unspecified: Secondary | ICD-10-CM | POA: Diagnosis not present

## 2020-01-15 DIAGNOSIS — R52 Pain, unspecified: Secondary | ICD-10-CM | POA: Diagnosis not present

## 2020-01-15 DIAGNOSIS — H3530 Unspecified macular degeneration: Secondary | ICD-10-CM | POA: Diagnosis not present

## 2020-01-15 DIAGNOSIS — E7800 Pure hypercholesterolemia, unspecified: Secondary | ICD-10-CM | POA: Diagnosis not present

## 2020-01-15 DIAGNOSIS — C61 Malignant neoplasm of prostate: Secondary | ICD-10-CM | POA: Diagnosis not present

## 2020-01-15 DIAGNOSIS — R531 Weakness: Secondary | ICD-10-CM | POA: Diagnosis not present

## 2020-01-15 DIAGNOSIS — I10 Essential (primary) hypertension: Secondary | ICD-10-CM | POA: Diagnosis not present

## 2020-01-17 DIAGNOSIS — R52 Pain, unspecified: Secondary | ICD-10-CM | POA: Diagnosis not present

## 2020-01-17 DIAGNOSIS — C61 Malignant neoplasm of prostate: Secondary | ICD-10-CM | POA: Diagnosis not present

## 2020-01-17 DIAGNOSIS — H3530 Unspecified macular degeneration: Secondary | ICD-10-CM | POA: Diagnosis not present

## 2020-01-17 DIAGNOSIS — E7800 Pure hypercholesterolemia, unspecified: Secondary | ICD-10-CM | POA: Diagnosis not present

## 2020-01-17 DIAGNOSIS — I10 Essential (primary) hypertension: Secondary | ICD-10-CM | POA: Diagnosis not present

## 2020-01-17 DIAGNOSIS — R531 Weakness: Secondary | ICD-10-CM | POA: Diagnosis not present

## 2020-01-20 DIAGNOSIS — C61 Malignant neoplasm of prostate: Secondary | ICD-10-CM | POA: Diagnosis not present

## 2020-01-20 DIAGNOSIS — R52 Pain, unspecified: Secondary | ICD-10-CM | POA: Diagnosis not present

## 2020-01-20 DIAGNOSIS — R531 Weakness: Secondary | ICD-10-CM | POA: Diagnosis not present

## 2020-01-20 DIAGNOSIS — H3530 Unspecified macular degeneration: Secondary | ICD-10-CM | POA: Diagnosis not present

## 2020-01-20 DIAGNOSIS — I10 Essential (primary) hypertension: Secondary | ICD-10-CM | POA: Diagnosis not present

## 2020-01-20 DIAGNOSIS — E7800 Pure hypercholesterolemia, unspecified: Secondary | ICD-10-CM | POA: Diagnosis not present

## 2020-01-22 DIAGNOSIS — R531 Weakness: Secondary | ICD-10-CM | POA: Diagnosis not present

## 2020-01-22 DIAGNOSIS — C61 Malignant neoplasm of prostate: Secondary | ICD-10-CM | POA: Diagnosis not present

## 2020-01-22 DIAGNOSIS — H3530 Unspecified macular degeneration: Secondary | ICD-10-CM | POA: Diagnosis not present

## 2020-01-22 DIAGNOSIS — R52 Pain, unspecified: Secondary | ICD-10-CM | POA: Diagnosis not present

## 2020-01-22 DIAGNOSIS — E7800 Pure hypercholesterolemia, unspecified: Secondary | ICD-10-CM | POA: Diagnosis not present

## 2020-01-22 DIAGNOSIS — I10 Essential (primary) hypertension: Secondary | ICD-10-CM | POA: Diagnosis not present

## 2020-01-24 DIAGNOSIS — I10 Essential (primary) hypertension: Secondary | ICD-10-CM | POA: Diagnosis not present

## 2020-01-24 DIAGNOSIS — H3530 Unspecified macular degeneration: Secondary | ICD-10-CM | POA: Diagnosis not present

## 2020-01-24 DIAGNOSIS — R531 Weakness: Secondary | ICD-10-CM | POA: Diagnosis not present

## 2020-01-24 DIAGNOSIS — R52 Pain, unspecified: Secondary | ICD-10-CM | POA: Diagnosis not present

## 2020-01-24 DIAGNOSIS — E7800 Pure hypercholesterolemia, unspecified: Secondary | ICD-10-CM | POA: Diagnosis not present

## 2020-01-24 DIAGNOSIS — C61 Malignant neoplasm of prostate: Secondary | ICD-10-CM | POA: Diagnosis not present

## 2020-01-27 DIAGNOSIS — R531 Weakness: Secondary | ICD-10-CM | POA: Diagnosis not present

## 2020-01-27 DIAGNOSIS — R52 Pain, unspecified: Secondary | ICD-10-CM | POA: Diagnosis not present

## 2020-01-27 DIAGNOSIS — C61 Malignant neoplasm of prostate: Secondary | ICD-10-CM | POA: Diagnosis not present

## 2020-01-27 DIAGNOSIS — I10 Essential (primary) hypertension: Secondary | ICD-10-CM | POA: Diagnosis not present

## 2020-01-27 DIAGNOSIS — H3530 Unspecified macular degeneration: Secondary | ICD-10-CM | POA: Diagnosis not present

## 2020-01-27 DIAGNOSIS — R609 Edema, unspecified: Secondary | ICD-10-CM | POA: Diagnosis not present

## 2020-01-27 DIAGNOSIS — E7800 Pure hypercholesterolemia, unspecified: Secondary | ICD-10-CM | POA: Diagnosis not present

## 2020-01-29 ENCOUNTER — Other Ambulatory Visit: Payer: Self-pay

## 2020-01-29 ENCOUNTER — Encounter (INDEPENDENT_AMBULATORY_CARE_PROVIDER_SITE_OTHER): Payer: Medicare Other | Admitting: Ophthalmology

## 2020-01-29 DIAGNOSIS — H35033 Hypertensive retinopathy, bilateral: Secondary | ICD-10-CM

## 2020-01-29 DIAGNOSIS — I10 Essential (primary) hypertension: Secondary | ICD-10-CM

## 2020-01-29 DIAGNOSIS — H353122 Nonexudative age-related macular degeneration, left eye, intermediate dry stage: Secondary | ICD-10-CM

## 2020-01-29 DIAGNOSIS — H353211 Exudative age-related macular degeneration, right eye, with active choroidal neovascularization: Secondary | ICD-10-CM

## 2020-01-29 DIAGNOSIS — H43813 Vitreous degeneration, bilateral: Secondary | ICD-10-CM

## 2020-01-30 DIAGNOSIS — E7800 Pure hypercholesterolemia, unspecified: Secondary | ICD-10-CM | POA: Diagnosis not present

## 2020-01-30 DIAGNOSIS — R531 Weakness: Secondary | ICD-10-CM | POA: Diagnosis not present

## 2020-01-30 DIAGNOSIS — R52 Pain, unspecified: Secondary | ICD-10-CM | POA: Diagnosis not present

## 2020-01-30 DIAGNOSIS — C61 Malignant neoplasm of prostate: Secondary | ICD-10-CM | POA: Diagnosis not present

## 2020-01-30 DIAGNOSIS — I10 Essential (primary) hypertension: Secondary | ICD-10-CM | POA: Diagnosis not present

## 2020-01-30 DIAGNOSIS — H3530 Unspecified macular degeneration: Secondary | ICD-10-CM | POA: Diagnosis not present

## 2020-01-31 DIAGNOSIS — E7800 Pure hypercholesterolemia, unspecified: Secondary | ICD-10-CM | POA: Diagnosis not present

## 2020-01-31 DIAGNOSIS — I10 Essential (primary) hypertension: Secondary | ICD-10-CM | POA: Diagnosis not present

## 2020-01-31 DIAGNOSIS — R531 Weakness: Secondary | ICD-10-CM | POA: Diagnosis not present

## 2020-01-31 DIAGNOSIS — R52 Pain, unspecified: Secondary | ICD-10-CM | POA: Diagnosis not present

## 2020-01-31 DIAGNOSIS — C61 Malignant neoplasm of prostate: Secondary | ICD-10-CM | POA: Diagnosis not present

## 2020-01-31 DIAGNOSIS — H3530 Unspecified macular degeneration: Secondary | ICD-10-CM | POA: Diagnosis not present

## 2020-02-03 DIAGNOSIS — E7800 Pure hypercholesterolemia, unspecified: Secondary | ICD-10-CM | POA: Diagnosis not present

## 2020-02-03 DIAGNOSIS — C61 Malignant neoplasm of prostate: Secondary | ICD-10-CM | POA: Diagnosis not present

## 2020-02-03 DIAGNOSIS — R531 Weakness: Secondary | ICD-10-CM | POA: Diagnosis not present

## 2020-02-03 DIAGNOSIS — I10 Essential (primary) hypertension: Secondary | ICD-10-CM | POA: Diagnosis not present

## 2020-02-03 DIAGNOSIS — H3530 Unspecified macular degeneration: Secondary | ICD-10-CM | POA: Diagnosis not present

## 2020-02-03 DIAGNOSIS — R52 Pain, unspecified: Secondary | ICD-10-CM | POA: Diagnosis not present

## 2020-02-05 DIAGNOSIS — E7800 Pure hypercholesterolemia, unspecified: Secondary | ICD-10-CM | POA: Diagnosis not present

## 2020-02-05 DIAGNOSIS — I10 Essential (primary) hypertension: Secondary | ICD-10-CM | POA: Diagnosis not present

## 2020-02-05 DIAGNOSIS — R52 Pain, unspecified: Secondary | ICD-10-CM | POA: Diagnosis not present

## 2020-02-05 DIAGNOSIS — C61 Malignant neoplasm of prostate: Secondary | ICD-10-CM | POA: Diagnosis not present

## 2020-02-05 DIAGNOSIS — R531 Weakness: Secondary | ICD-10-CM | POA: Diagnosis not present

## 2020-02-05 DIAGNOSIS — H3530 Unspecified macular degeneration: Secondary | ICD-10-CM | POA: Diagnosis not present

## 2020-02-06 DIAGNOSIS — R52 Pain, unspecified: Secondary | ICD-10-CM | POA: Diagnosis not present

## 2020-02-06 DIAGNOSIS — I10 Essential (primary) hypertension: Secondary | ICD-10-CM | POA: Diagnosis not present

## 2020-02-06 DIAGNOSIS — R531 Weakness: Secondary | ICD-10-CM | POA: Diagnosis not present

## 2020-02-06 DIAGNOSIS — C61 Malignant neoplasm of prostate: Secondary | ICD-10-CM | POA: Diagnosis not present

## 2020-02-06 DIAGNOSIS — E7800 Pure hypercholesterolemia, unspecified: Secondary | ICD-10-CM | POA: Diagnosis not present

## 2020-02-06 DIAGNOSIS — H3530 Unspecified macular degeneration: Secondary | ICD-10-CM | POA: Diagnosis not present

## 2020-02-10 DIAGNOSIS — R531 Weakness: Secondary | ICD-10-CM | POA: Diagnosis not present

## 2020-02-10 DIAGNOSIS — I10 Essential (primary) hypertension: Secondary | ICD-10-CM | POA: Diagnosis not present

## 2020-02-10 DIAGNOSIS — R52 Pain, unspecified: Secondary | ICD-10-CM | POA: Diagnosis not present

## 2020-02-10 DIAGNOSIS — H3530 Unspecified macular degeneration: Secondary | ICD-10-CM | POA: Diagnosis not present

## 2020-02-10 DIAGNOSIS — E7800 Pure hypercholesterolemia, unspecified: Secondary | ICD-10-CM | POA: Diagnosis not present

## 2020-02-10 DIAGNOSIS — C61 Malignant neoplasm of prostate: Secondary | ICD-10-CM | POA: Diagnosis not present

## 2020-02-11 DIAGNOSIS — E7800 Pure hypercholesterolemia, unspecified: Secondary | ICD-10-CM | POA: Diagnosis not present

## 2020-02-11 DIAGNOSIS — R531 Weakness: Secondary | ICD-10-CM | POA: Diagnosis not present

## 2020-02-11 DIAGNOSIS — I10 Essential (primary) hypertension: Secondary | ICD-10-CM | POA: Diagnosis not present

## 2020-02-11 DIAGNOSIS — C61 Malignant neoplasm of prostate: Secondary | ICD-10-CM | POA: Diagnosis not present

## 2020-02-11 DIAGNOSIS — R52 Pain, unspecified: Secondary | ICD-10-CM | POA: Diagnosis not present

## 2020-02-11 DIAGNOSIS — H3530 Unspecified macular degeneration: Secondary | ICD-10-CM | POA: Diagnosis not present

## 2020-02-13 DIAGNOSIS — R531 Weakness: Secondary | ICD-10-CM | POA: Diagnosis not present

## 2020-02-13 DIAGNOSIS — R52 Pain, unspecified: Secondary | ICD-10-CM | POA: Diagnosis not present

## 2020-02-13 DIAGNOSIS — H3530 Unspecified macular degeneration: Secondary | ICD-10-CM | POA: Diagnosis not present

## 2020-02-13 DIAGNOSIS — I10 Essential (primary) hypertension: Secondary | ICD-10-CM | POA: Diagnosis not present

## 2020-02-13 DIAGNOSIS — E7800 Pure hypercholesterolemia, unspecified: Secondary | ICD-10-CM | POA: Diagnosis not present

## 2020-02-13 DIAGNOSIS — C61 Malignant neoplasm of prostate: Secondary | ICD-10-CM | POA: Diagnosis not present

## 2020-02-17 DIAGNOSIS — R52 Pain, unspecified: Secondary | ICD-10-CM | POA: Diagnosis not present

## 2020-02-17 DIAGNOSIS — R531 Weakness: Secondary | ICD-10-CM | POA: Diagnosis not present

## 2020-02-17 DIAGNOSIS — I10 Essential (primary) hypertension: Secondary | ICD-10-CM | POA: Diagnosis not present

## 2020-02-17 DIAGNOSIS — H3530 Unspecified macular degeneration: Secondary | ICD-10-CM | POA: Diagnosis not present

## 2020-02-17 DIAGNOSIS — E7800 Pure hypercholesterolemia, unspecified: Secondary | ICD-10-CM | POA: Diagnosis not present

## 2020-02-17 DIAGNOSIS — C61 Malignant neoplasm of prostate: Secondary | ICD-10-CM | POA: Diagnosis not present

## 2020-02-18 DIAGNOSIS — Z23 Encounter for immunization: Secondary | ICD-10-CM | POA: Diagnosis not present

## 2020-02-18 DIAGNOSIS — R52 Pain, unspecified: Secondary | ICD-10-CM | POA: Diagnosis not present

## 2020-02-18 DIAGNOSIS — I10 Essential (primary) hypertension: Secondary | ICD-10-CM | POA: Diagnosis not present

## 2020-02-18 DIAGNOSIS — C61 Malignant neoplasm of prostate: Secondary | ICD-10-CM | POA: Diagnosis not present

## 2020-02-18 DIAGNOSIS — R531 Weakness: Secondary | ICD-10-CM | POA: Diagnosis not present

## 2020-02-18 DIAGNOSIS — E7800 Pure hypercholesterolemia, unspecified: Secondary | ICD-10-CM | POA: Diagnosis not present

## 2020-02-18 DIAGNOSIS — H3530 Unspecified macular degeneration: Secondary | ICD-10-CM | POA: Diagnosis not present

## 2020-02-24 DIAGNOSIS — R531 Weakness: Secondary | ICD-10-CM | POA: Diagnosis not present

## 2020-02-24 DIAGNOSIS — C61 Malignant neoplasm of prostate: Secondary | ICD-10-CM | POA: Diagnosis not present

## 2020-02-24 DIAGNOSIS — I10 Essential (primary) hypertension: Secondary | ICD-10-CM | POA: Diagnosis not present

## 2020-02-24 DIAGNOSIS — H3530 Unspecified macular degeneration: Secondary | ICD-10-CM | POA: Diagnosis not present

## 2020-02-24 DIAGNOSIS — E7800 Pure hypercholesterolemia, unspecified: Secondary | ICD-10-CM | POA: Diagnosis not present

## 2020-02-24 DIAGNOSIS — R52 Pain, unspecified: Secondary | ICD-10-CM | POA: Diagnosis not present

## 2020-02-26 DIAGNOSIS — R609 Edema, unspecified: Secondary | ICD-10-CM | POA: Diagnosis not present

## 2020-02-26 DIAGNOSIS — R52 Pain, unspecified: Secondary | ICD-10-CM | POA: Diagnosis not present

## 2020-02-26 DIAGNOSIS — C61 Malignant neoplasm of prostate: Secondary | ICD-10-CM | POA: Diagnosis not present

## 2020-02-26 DIAGNOSIS — E7800 Pure hypercholesterolemia, unspecified: Secondary | ICD-10-CM | POA: Diagnosis not present

## 2020-02-26 DIAGNOSIS — R531 Weakness: Secondary | ICD-10-CM | POA: Diagnosis not present

## 2020-02-26 DIAGNOSIS — I10 Essential (primary) hypertension: Secondary | ICD-10-CM | POA: Diagnosis not present

## 2020-02-26 DIAGNOSIS — H3530 Unspecified macular degeneration: Secondary | ICD-10-CM | POA: Diagnosis not present

## 2020-02-27 ENCOUNTER — Encounter (INDEPENDENT_AMBULATORY_CARE_PROVIDER_SITE_OTHER): Payer: Medicare Other | Admitting: Ophthalmology

## 2020-02-27 ENCOUNTER — Other Ambulatory Visit: Payer: Self-pay

## 2020-02-27 DIAGNOSIS — R531 Weakness: Secondary | ICD-10-CM | POA: Diagnosis not present

## 2020-02-27 DIAGNOSIS — H3530 Unspecified macular degeneration: Secondary | ICD-10-CM | POA: Diagnosis not present

## 2020-02-27 DIAGNOSIS — H353122 Nonexudative age-related macular degeneration, left eye, intermediate dry stage: Secondary | ICD-10-CM | POA: Diagnosis not present

## 2020-02-27 DIAGNOSIS — H35033 Hypertensive retinopathy, bilateral: Secondary | ICD-10-CM

## 2020-02-27 DIAGNOSIS — C61 Malignant neoplasm of prostate: Secondary | ICD-10-CM | POA: Diagnosis not present

## 2020-02-27 DIAGNOSIS — H353211 Exudative age-related macular degeneration, right eye, with active choroidal neovascularization: Secondary | ICD-10-CM | POA: Diagnosis not present

## 2020-02-27 DIAGNOSIS — E7800 Pure hypercholesterolemia, unspecified: Secondary | ICD-10-CM | POA: Diagnosis not present

## 2020-02-27 DIAGNOSIS — H43813 Vitreous degeneration, bilateral: Secondary | ICD-10-CM | POA: Diagnosis not present

## 2020-02-27 DIAGNOSIS — R52 Pain, unspecified: Secondary | ICD-10-CM | POA: Diagnosis not present

## 2020-02-27 DIAGNOSIS — I10 Essential (primary) hypertension: Secondary | ICD-10-CM

## 2020-02-28 DIAGNOSIS — C61 Malignant neoplasm of prostate: Secondary | ICD-10-CM | POA: Diagnosis not present

## 2020-02-28 DIAGNOSIS — R531 Weakness: Secondary | ICD-10-CM | POA: Diagnosis not present

## 2020-02-28 DIAGNOSIS — I10 Essential (primary) hypertension: Secondary | ICD-10-CM | POA: Diagnosis not present

## 2020-02-28 DIAGNOSIS — H3530 Unspecified macular degeneration: Secondary | ICD-10-CM | POA: Diagnosis not present

## 2020-02-28 DIAGNOSIS — E7800 Pure hypercholesterolemia, unspecified: Secondary | ICD-10-CM | POA: Diagnosis not present

## 2020-02-28 DIAGNOSIS — R52 Pain, unspecified: Secondary | ICD-10-CM | POA: Diagnosis not present

## 2020-03-02 DIAGNOSIS — I10 Essential (primary) hypertension: Secondary | ICD-10-CM | POA: Diagnosis not present

## 2020-03-02 DIAGNOSIS — H3530 Unspecified macular degeneration: Secondary | ICD-10-CM | POA: Diagnosis not present

## 2020-03-02 DIAGNOSIS — R531 Weakness: Secondary | ICD-10-CM | POA: Diagnosis not present

## 2020-03-02 DIAGNOSIS — E7800 Pure hypercholesterolemia, unspecified: Secondary | ICD-10-CM | POA: Diagnosis not present

## 2020-03-02 DIAGNOSIS — C61 Malignant neoplasm of prostate: Secondary | ICD-10-CM | POA: Diagnosis not present

## 2020-03-02 DIAGNOSIS — R52 Pain, unspecified: Secondary | ICD-10-CM | POA: Diagnosis not present

## 2020-03-04 DIAGNOSIS — C61 Malignant neoplasm of prostate: Secondary | ICD-10-CM | POA: Diagnosis not present

## 2020-03-04 DIAGNOSIS — E7800 Pure hypercholesterolemia, unspecified: Secondary | ICD-10-CM | POA: Diagnosis not present

## 2020-03-04 DIAGNOSIS — H3530 Unspecified macular degeneration: Secondary | ICD-10-CM | POA: Diagnosis not present

## 2020-03-04 DIAGNOSIS — R531 Weakness: Secondary | ICD-10-CM | POA: Diagnosis not present

## 2020-03-04 DIAGNOSIS — I10 Essential (primary) hypertension: Secondary | ICD-10-CM | POA: Diagnosis not present

## 2020-03-04 DIAGNOSIS — R52 Pain, unspecified: Secondary | ICD-10-CM | POA: Diagnosis not present

## 2020-03-06 DIAGNOSIS — E7800 Pure hypercholesterolemia, unspecified: Secondary | ICD-10-CM | POA: Diagnosis not present

## 2020-03-06 DIAGNOSIS — C61 Malignant neoplasm of prostate: Secondary | ICD-10-CM | POA: Diagnosis not present

## 2020-03-06 DIAGNOSIS — I10 Essential (primary) hypertension: Secondary | ICD-10-CM | POA: Diagnosis not present

## 2020-03-06 DIAGNOSIS — H3530 Unspecified macular degeneration: Secondary | ICD-10-CM | POA: Diagnosis not present

## 2020-03-06 DIAGNOSIS — R52 Pain, unspecified: Secondary | ICD-10-CM | POA: Diagnosis not present

## 2020-03-06 DIAGNOSIS — R531 Weakness: Secondary | ICD-10-CM | POA: Diagnosis not present

## 2020-03-07 DIAGNOSIS — R5381 Other malaise: Secondary | ICD-10-CM | POA: Diagnosis not present

## 2020-03-07 DIAGNOSIS — R531 Weakness: Secondary | ICD-10-CM | POA: Diagnosis not present

## 2020-03-07 DIAGNOSIS — W19XXXA Unspecified fall, initial encounter: Secondary | ICD-10-CM | POA: Diagnosis not present

## 2020-03-08 DIAGNOSIS — C61 Malignant neoplasm of prostate: Secondary | ICD-10-CM | POA: Diagnosis not present

## 2020-03-08 DIAGNOSIS — R531 Weakness: Secondary | ICD-10-CM | POA: Diagnosis not present

## 2020-03-08 DIAGNOSIS — H3530 Unspecified macular degeneration: Secondary | ICD-10-CM | POA: Diagnosis not present

## 2020-03-08 DIAGNOSIS — E7800 Pure hypercholesterolemia, unspecified: Secondary | ICD-10-CM | POA: Diagnosis not present

## 2020-03-08 DIAGNOSIS — R52 Pain, unspecified: Secondary | ICD-10-CM | POA: Diagnosis not present

## 2020-03-08 DIAGNOSIS — I10 Essential (primary) hypertension: Secondary | ICD-10-CM | POA: Diagnosis not present

## 2020-03-09 DIAGNOSIS — E7800 Pure hypercholesterolemia, unspecified: Secondary | ICD-10-CM | POA: Diagnosis not present

## 2020-03-09 DIAGNOSIS — R52 Pain, unspecified: Secondary | ICD-10-CM | POA: Diagnosis not present

## 2020-03-09 DIAGNOSIS — C61 Malignant neoplasm of prostate: Secondary | ICD-10-CM | POA: Diagnosis not present

## 2020-03-09 DIAGNOSIS — I10 Essential (primary) hypertension: Secondary | ICD-10-CM | POA: Diagnosis not present

## 2020-03-09 DIAGNOSIS — R531 Weakness: Secondary | ICD-10-CM | POA: Diagnosis not present

## 2020-03-09 DIAGNOSIS — H3530 Unspecified macular degeneration: Secondary | ICD-10-CM | POA: Diagnosis not present

## 2020-03-10 DIAGNOSIS — R531 Weakness: Secondary | ICD-10-CM | POA: Diagnosis not present

## 2020-03-10 DIAGNOSIS — E7800 Pure hypercholesterolemia, unspecified: Secondary | ICD-10-CM | POA: Diagnosis not present

## 2020-03-10 DIAGNOSIS — I10 Essential (primary) hypertension: Secondary | ICD-10-CM | POA: Diagnosis not present

## 2020-03-10 DIAGNOSIS — C61 Malignant neoplasm of prostate: Secondary | ICD-10-CM | POA: Diagnosis not present

## 2020-03-10 DIAGNOSIS — H3530 Unspecified macular degeneration: Secondary | ICD-10-CM | POA: Diagnosis not present

## 2020-03-10 DIAGNOSIS — R52 Pain, unspecified: Secondary | ICD-10-CM | POA: Diagnosis not present

## 2020-03-11 DIAGNOSIS — E7800 Pure hypercholesterolemia, unspecified: Secondary | ICD-10-CM | POA: Diagnosis not present

## 2020-03-11 DIAGNOSIS — C61 Malignant neoplasm of prostate: Secondary | ICD-10-CM | POA: Diagnosis not present

## 2020-03-11 DIAGNOSIS — H3530 Unspecified macular degeneration: Secondary | ICD-10-CM | POA: Diagnosis not present

## 2020-03-11 DIAGNOSIS — R52 Pain, unspecified: Secondary | ICD-10-CM | POA: Diagnosis not present

## 2020-03-11 DIAGNOSIS — R531 Weakness: Secondary | ICD-10-CM | POA: Diagnosis not present

## 2020-03-11 DIAGNOSIS — I10 Essential (primary) hypertension: Secondary | ICD-10-CM | POA: Diagnosis not present

## 2020-03-12 DIAGNOSIS — R52 Pain, unspecified: Secondary | ICD-10-CM | POA: Diagnosis not present

## 2020-03-12 DIAGNOSIS — E7800 Pure hypercholesterolemia, unspecified: Secondary | ICD-10-CM | POA: Diagnosis not present

## 2020-03-12 DIAGNOSIS — H3530 Unspecified macular degeneration: Secondary | ICD-10-CM | POA: Diagnosis not present

## 2020-03-12 DIAGNOSIS — I10 Essential (primary) hypertension: Secondary | ICD-10-CM | POA: Diagnosis not present

## 2020-03-12 DIAGNOSIS — C61 Malignant neoplasm of prostate: Secondary | ICD-10-CM | POA: Diagnosis not present

## 2020-03-12 DIAGNOSIS — R531 Weakness: Secondary | ICD-10-CM | POA: Diagnosis not present

## 2020-03-13 DIAGNOSIS — R531 Weakness: Secondary | ICD-10-CM | POA: Diagnosis not present

## 2020-03-13 DIAGNOSIS — C61 Malignant neoplasm of prostate: Secondary | ICD-10-CM | POA: Diagnosis not present

## 2020-03-13 DIAGNOSIS — R52 Pain, unspecified: Secondary | ICD-10-CM | POA: Diagnosis not present

## 2020-03-13 DIAGNOSIS — I10 Essential (primary) hypertension: Secondary | ICD-10-CM | POA: Diagnosis not present

## 2020-03-13 DIAGNOSIS — H3530 Unspecified macular degeneration: Secondary | ICD-10-CM | POA: Diagnosis not present

## 2020-03-13 DIAGNOSIS — E7800 Pure hypercholesterolemia, unspecified: Secondary | ICD-10-CM | POA: Diagnosis not present

## 2020-03-16 DIAGNOSIS — C61 Malignant neoplasm of prostate: Secondary | ICD-10-CM | POA: Diagnosis not present

## 2020-03-16 DIAGNOSIS — E7800 Pure hypercholesterolemia, unspecified: Secondary | ICD-10-CM | POA: Diagnosis not present

## 2020-03-16 DIAGNOSIS — H3530 Unspecified macular degeneration: Secondary | ICD-10-CM | POA: Diagnosis not present

## 2020-03-16 DIAGNOSIS — R531 Weakness: Secondary | ICD-10-CM | POA: Diagnosis not present

## 2020-03-16 DIAGNOSIS — I10 Essential (primary) hypertension: Secondary | ICD-10-CM | POA: Diagnosis not present

## 2020-03-16 DIAGNOSIS — R52 Pain, unspecified: Secondary | ICD-10-CM | POA: Diagnosis not present

## 2020-03-18 DIAGNOSIS — R531 Weakness: Secondary | ICD-10-CM | POA: Diagnosis not present

## 2020-03-18 DIAGNOSIS — E7800 Pure hypercholesterolemia, unspecified: Secondary | ICD-10-CM | POA: Diagnosis not present

## 2020-03-18 DIAGNOSIS — R52 Pain, unspecified: Secondary | ICD-10-CM | POA: Diagnosis not present

## 2020-03-18 DIAGNOSIS — C61 Malignant neoplasm of prostate: Secondary | ICD-10-CM | POA: Diagnosis not present

## 2020-03-18 DIAGNOSIS — I10 Essential (primary) hypertension: Secondary | ICD-10-CM | POA: Diagnosis not present

## 2020-03-18 DIAGNOSIS — H3530 Unspecified macular degeneration: Secondary | ICD-10-CM | POA: Diagnosis not present

## 2020-03-23 DIAGNOSIS — I10 Essential (primary) hypertension: Secondary | ICD-10-CM | POA: Diagnosis not present

## 2020-03-23 DIAGNOSIS — H3530 Unspecified macular degeneration: Secondary | ICD-10-CM | POA: Diagnosis not present

## 2020-03-23 DIAGNOSIS — R531 Weakness: Secondary | ICD-10-CM | POA: Diagnosis not present

## 2020-03-23 DIAGNOSIS — E7800 Pure hypercholesterolemia, unspecified: Secondary | ICD-10-CM | POA: Diagnosis not present

## 2020-03-23 DIAGNOSIS — C61 Malignant neoplasm of prostate: Secondary | ICD-10-CM | POA: Diagnosis not present

## 2020-03-23 DIAGNOSIS — R52 Pain, unspecified: Secondary | ICD-10-CM | POA: Diagnosis not present

## 2020-03-25 DIAGNOSIS — E7800 Pure hypercholesterolemia, unspecified: Secondary | ICD-10-CM | POA: Diagnosis not present

## 2020-03-25 DIAGNOSIS — I10 Essential (primary) hypertension: Secondary | ICD-10-CM | POA: Diagnosis not present

## 2020-03-25 DIAGNOSIS — H3530 Unspecified macular degeneration: Secondary | ICD-10-CM | POA: Diagnosis not present

## 2020-03-25 DIAGNOSIS — R531 Weakness: Secondary | ICD-10-CM | POA: Diagnosis not present

## 2020-03-25 DIAGNOSIS — R52 Pain, unspecified: Secondary | ICD-10-CM | POA: Diagnosis not present

## 2020-03-25 DIAGNOSIS — C61 Malignant neoplasm of prostate: Secondary | ICD-10-CM | POA: Diagnosis not present

## 2020-03-26 DIAGNOSIS — W19XXXA Unspecified fall, initial encounter: Secondary | ICD-10-CM | POA: Diagnosis not present

## 2020-03-26 DIAGNOSIS — R531 Weakness: Secondary | ICD-10-CM | POA: Diagnosis not present

## 2020-03-26 DIAGNOSIS — R5381 Other malaise: Secondary | ICD-10-CM | POA: Diagnosis not present

## 2020-03-28 DIAGNOSIS — R531 Weakness: Secondary | ICD-10-CM | POA: Diagnosis not present

## 2020-03-28 DIAGNOSIS — I10 Essential (primary) hypertension: Secondary | ICD-10-CM | POA: Diagnosis not present

## 2020-03-28 DIAGNOSIS — R609 Edema, unspecified: Secondary | ICD-10-CM | POA: Diagnosis not present

## 2020-03-28 DIAGNOSIS — E7800 Pure hypercholesterolemia, unspecified: Secondary | ICD-10-CM | POA: Diagnosis not present

## 2020-03-28 DIAGNOSIS — C61 Malignant neoplasm of prostate: Secondary | ICD-10-CM | POA: Diagnosis not present

## 2020-03-28 DIAGNOSIS — R52 Pain, unspecified: Secondary | ICD-10-CM | POA: Diagnosis not present

## 2020-03-28 DIAGNOSIS — H3530 Unspecified macular degeneration: Secondary | ICD-10-CM | POA: Diagnosis not present

## 2020-03-29 DIAGNOSIS — I959 Hypotension, unspecified: Secondary | ICD-10-CM | POA: Diagnosis not present

## 2020-03-29 DIAGNOSIS — R0689 Other abnormalities of breathing: Secondary | ICD-10-CM | POA: Diagnosis not present

## 2020-03-29 DIAGNOSIS — R4020 Unspecified coma: Secondary | ICD-10-CM | POA: Diagnosis not present

## 2020-03-29 DIAGNOSIS — R001 Bradycardia, unspecified: Secondary | ICD-10-CM | POA: Diagnosis not present

## 2020-03-31 DIAGNOSIS — R52 Pain, unspecified: Secondary | ICD-10-CM | POA: Diagnosis not present

## 2020-03-31 DIAGNOSIS — R531 Weakness: Secondary | ICD-10-CM | POA: Diagnosis not present

## 2020-03-31 DIAGNOSIS — H3530 Unspecified macular degeneration: Secondary | ICD-10-CM | POA: Diagnosis not present

## 2020-03-31 DIAGNOSIS — C61 Malignant neoplasm of prostate: Secondary | ICD-10-CM | POA: Diagnosis not present

## 2020-03-31 DIAGNOSIS — E7800 Pure hypercholesterolemia, unspecified: Secondary | ICD-10-CM | POA: Diagnosis not present

## 2020-03-31 DIAGNOSIS — I10 Essential (primary) hypertension: Secondary | ICD-10-CM | POA: Diagnosis not present

## 2020-04-01 DIAGNOSIS — I10 Essential (primary) hypertension: Secondary | ICD-10-CM | POA: Diagnosis not present

## 2020-04-01 DIAGNOSIS — C61 Malignant neoplasm of prostate: Secondary | ICD-10-CM | POA: Diagnosis not present

## 2020-04-01 DIAGNOSIS — H3530 Unspecified macular degeneration: Secondary | ICD-10-CM | POA: Diagnosis not present

## 2020-04-01 DIAGNOSIS — R52 Pain, unspecified: Secondary | ICD-10-CM | POA: Diagnosis not present

## 2020-04-01 DIAGNOSIS — E7800 Pure hypercholesterolemia, unspecified: Secondary | ICD-10-CM | POA: Diagnosis not present

## 2020-04-01 DIAGNOSIS — R531 Weakness: Secondary | ICD-10-CM | POA: Diagnosis not present

## 2020-04-02 ENCOUNTER — Encounter (INDEPENDENT_AMBULATORY_CARE_PROVIDER_SITE_OTHER): Admitting: Ophthalmology

## 2020-04-02 DIAGNOSIS — I10 Essential (primary) hypertension: Secondary | ICD-10-CM | POA: Diagnosis not present

## 2020-04-02 DIAGNOSIS — R531 Weakness: Secondary | ICD-10-CM | POA: Diagnosis not present

## 2020-04-02 DIAGNOSIS — C61 Malignant neoplasm of prostate: Secondary | ICD-10-CM | POA: Diagnosis not present

## 2020-04-02 DIAGNOSIS — E7800 Pure hypercholesterolemia, unspecified: Secondary | ICD-10-CM | POA: Diagnosis not present

## 2020-04-02 DIAGNOSIS — R52 Pain, unspecified: Secondary | ICD-10-CM | POA: Diagnosis not present

## 2020-04-02 DIAGNOSIS — H3530 Unspecified macular degeneration: Secondary | ICD-10-CM | POA: Diagnosis not present

## 2020-04-03 DIAGNOSIS — H3530 Unspecified macular degeneration: Secondary | ICD-10-CM | POA: Diagnosis not present

## 2020-04-03 DIAGNOSIS — R52 Pain, unspecified: Secondary | ICD-10-CM | POA: Diagnosis not present

## 2020-04-03 DIAGNOSIS — C61 Malignant neoplasm of prostate: Secondary | ICD-10-CM | POA: Diagnosis not present

## 2020-04-03 DIAGNOSIS — I10 Essential (primary) hypertension: Secondary | ICD-10-CM | POA: Diagnosis not present

## 2020-04-03 DIAGNOSIS — E7800 Pure hypercholesterolemia, unspecified: Secondary | ICD-10-CM | POA: Diagnosis not present

## 2020-04-03 DIAGNOSIS — R531 Weakness: Secondary | ICD-10-CM | POA: Diagnosis not present

## 2020-04-07 DIAGNOSIS — C61 Malignant neoplasm of prostate: Secondary | ICD-10-CM | POA: Diagnosis not present

## 2020-04-07 DIAGNOSIS — R54 Age-related physical debility: Secondary | ICD-10-CM | POA: Diagnosis not present

## 2020-04-07 DIAGNOSIS — I10 Essential (primary) hypertension: Secondary | ICD-10-CM | POA: Diagnosis not present

## 2020-04-07 DIAGNOSIS — I509 Heart failure, unspecified: Secondary | ICD-10-CM | POA: Diagnosis not present

## 2020-04-08 DIAGNOSIS — I509 Heart failure, unspecified: Secondary | ICD-10-CM | POA: Diagnosis not present

## 2020-04-08 DIAGNOSIS — R54 Age-related physical debility: Secondary | ICD-10-CM | POA: Diagnosis not present

## 2020-04-08 DIAGNOSIS — C61 Malignant neoplasm of prostate: Secondary | ICD-10-CM | POA: Diagnosis not present

## 2020-04-08 DIAGNOSIS — I10 Essential (primary) hypertension: Secondary | ICD-10-CM | POA: Diagnosis not present

## 2020-04-10 DIAGNOSIS — C61 Malignant neoplasm of prostate: Secondary | ICD-10-CM | POA: Diagnosis not present

## 2020-04-10 DIAGNOSIS — R54 Age-related physical debility: Secondary | ICD-10-CM | POA: Diagnosis not present

## 2020-04-10 DIAGNOSIS — I10 Essential (primary) hypertension: Secondary | ICD-10-CM | POA: Diagnosis not present

## 2020-04-10 DIAGNOSIS — I509 Heart failure, unspecified: Secondary | ICD-10-CM | POA: Diagnosis not present

## 2020-04-12 DIAGNOSIS — S61412A Laceration without foreign body of left hand, initial encounter: Secondary | ICD-10-CM | POA: Diagnosis not present

## 2020-04-12 DIAGNOSIS — W010XXA Fall on same level from slipping, tripping and stumbling without subsequent striking against object, initial encounter: Secondary | ICD-10-CM | POA: Diagnosis not present

## 2020-04-12 DIAGNOSIS — S51812A Laceration without foreign body of left forearm, initial encounter: Secondary | ICD-10-CM | POA: Diagnosis not present

## 2020-04-12 DIAGNOSIS — W1839XA Other fall on same level, initial encounter: Secondary | ICD-10-CM | POA: Diagnosis not present

## 2020-04-12 DIAGNOSIS — S6982XA Other specified injuries of left wrist, hand and finger(s), initial encounter: Secondary | ICD-10-CM | POA: Diagnosis not present

## 2020-04-12 DIAGNOSIS — Z743 Need for continuous supervision: Secondary | ICD-10-CM | POA: Diagnosis not present

## 2020-04-14 DIAGNOSIS — I10 Essential (primary) hypertension: Secondary | ICD-10-CM | POA: Diagnosis not present

## 2020-04-14 DIAGNOSIS — R54 Age-related physical debility: Secondary | ICD-10-CM | POA: Diagnosis not present

## 2020-04-14 DIAGNOSIS — I509 Heart failure, unspecified: Secondary | ICD-10-CM | POA: Diagnosis not present

## 2020-04-14 DIAGNOSIS — C61 Malignant neoplasm of prostate: Secondary | ICD-10-CM | POA: Diagnosis not present

## 2020-04-15 DIAGNOSIS — C61 Malignant neoplasm of prostate: Secondary | ICD-10-CM | POA: Diagnosis not present

## 2020-04-15 DIAGNOSIS — R54 Age-related physical debility: Secondary | ICD-10-CM | POA: Diagnosis not present

## 2020-04-15 DIAGNOSIS — I10 Essential (primary) hypertension: Secondary | ICD-10-CM | POA: Diagnosis not present

## 2020-04-15 DIAGNOSIS — I509 Heart failure, unspecified: Secondary | ICD-10-CM | POA: Diagnosis not present

## 2020-04-16 DIAGNOSIS — I509 Heart failure, unspecified: Secondary | ICD-10-CM | POA: Diagnosis not present

## 2020-04-16 DIAGNOSIS — R54 Age-related physical debility: Secondary | ICD-10-CM | POA: Diagnosis not present

## 2020-04-16 DIAGNOSIS — I10 Essential (primary) hypertension: Secondary | ICD-10-CM | POA: Diagnosis not present

## 2020-04-16 DIAGNOSIS — C61 Malignant neoplasm of prostate: Secondary | ICD-10-CM | POA: Diagnosis not present

## 2020-04-17 DIAGNOSIS — I509 Heart failure, unspecified: Secondary | ICD-10-CM | POA: Diagnosis not present

## 2020-04-17 DIAGNOSIS — C61 Malignant neoplasm of prostate: Secondary | ICD-10-CM | POA: Diagnosis not present

## 2020-04-17 DIAGNOSIS — I10 Essential (primary) hypertension: Secondary | ICD-10-CM | POA: Diagnosis not present

## 2020-04-17 DIAGNOSIS — R54 Age-related physical debility: Secondary | ICD-10-CM | POA: Diagnosis not present

## 2020-04-20 DIAGNOSIS — S50811A Abrasion of right forearm, initial encounter: Secondary | ICD-10-CM | POA: Diagnosis not present

## 2020-04-20 DIAGNOSIS — R233 Spontaneous ecchymoses: Secondary | ICD-10-CM | POA: Diagnosis not present

## 2020-04-20 DIAGNOSIS — C44622 Squamous cell carcinoma of skin of right upper limb, including shoulder: Secondary | ICD-10-CM | POA: Diagnosis not present

## 2020-04-20 DIAGNOSIS — D485 Neoplasm of uncertain behavior of skin: Secondary | ICD-10-CM | POA: Diagnosis not present

## 2020-04-21 DIAGNOSIS — S90221A Contusion of right lesser toe(s) with damage to nail, initial encounter: Secondary | ICD-10-CM | POA: Diagnosis not present

## 2020-04-21 DIAGNOSIS — R54 Age-related physical debility: Secondary | ICD-10-CM | POA: Diagnosis not present

## 2020-04-21 DIAGNOSIS — Z9181 History of falling: Secondary | ICD-10-CM | POA: Diagnosis not present

## 2020-04-21 DIAGNOSIS — B351 Tinea unguium: Secondary | ICD-10-CM | POA: Diagnosis not present

## 2020-04-21 DIAGNOSIS — L600 Ingrowing nail: Secondary | ICD-10-CM | POA: Diagnosis not present

## 2020-04-21 DIAGNOSIS — C61 Malignant neoplasm of prostate: Secondary | ICD-10-CM | POA: Diagnosis not present

## 2020-04-21 DIAGNOSIS — G603 Idiopathic progressive neuropathy: Secondary | ICD-10-CM | POA: Diagnosis not present

## 2020-04-21 DIAGNOSIS — I10 Essential (primary) hypertension: Secondary | ICD-10-CM | POA: Diagnosis not present

## 2020-04-21 DIAGNOSIS — I509 Heart failure, unspecified: Secondary | ICD-10-CM | POA: Diagnosis not present

## 2020-04-21 DIAGNOSIS — S90415A Abrasion, left lesser toe(s), initial encounter: Secondary | ICD-10-CM | POA: Diagnosis not present

## 2020-04-21 DIAGNOSIS — M79675 Pain in left toe(s): Secondary | ICD-10-CM | POA: Diagnosis not present

## 2020-04-21 DIAGNOSIS — R262 Difficulty in walking, not elsewhere classified: Secondary | ICD-10-CM | POA: Diagnosis not present

## 2020-04-22 DIAGNOSIS — R54 Age-related physical debility: Secondary | ICD-10-CM | POA: Diagnosis not present

## 2020-04-22 DIAGNOSIS — C61 Malignant neoplasm of prostate: Secondary | ICD-10-CM | POA: Diagnosis not present

## 2020-04-22 DIAGNOSIS — I10 Essential (primary) hypertension: Secondary | ICD-10-CM | POA: Diagnosis not present

## 2020-04-22 DIAGNOSIS — I509 Heart failure, unspecified: Secondary | ICD-10-CM | POA: Diagnosis not present

## 2020-04-23 DIAGNOSIS — C61 Malignant neoplasm of prostate: Secondary | ICD-10-CM | POA: Diagnosis not present

## 2020-04-23 DIAGNOSIS — R54 Age-related physical debility: Secondary | ICD-10-CM | POA: Diagnosis not present

## 2020-04-23 DIAGNOSIS — I10 Essential (primary) hypertension: Secondary | ICD-10-CM | POA: Diagnosis not present

## 2020-04-23 DIAGNOSIS — I509 Heart failure, unspecified: Secondary | ICD-10-CM | POA: Diagnosis not present

## 2020-04-24 DIAGNOSIS — R54 Age-related physical debility: Secondary | ICD-10-CM | POA: Diagnosis not present

## 2020-04-24 DIAGNOSIS — C61 Malignant neoplasm of prostate: Secondary | ICD-10-CM | POA: Diagnosis not present

## 2020-04-24 DIAGNOSIS — I509 Heart failure, unspecified: Secondary | ICD-10-CM | POA: Diagnosis not present

## 2020-04-24 DIAGNOSIS — I10 Essential (primary) hypertension: Secondary | ICD-10-CM | POA: Diagnosis not present

## 2020-04-28 DIAGNOSIS — I10 Essential (primary) hypertension: Secondary | ICD-10-CM | POA: Diagnosis not present

## 2020-04-28 DIAGNOSIS — C61 Malignant neoplasm of prostate: Secondary | ICD-10-CM | POA: Diagnosis not present

## 2020-04-28 DIAGNOSIS — R54 Age-related physical debility: Secondary | ICD-10-CM | POA: Diagnosis not present

## 2020-04-28 DIAGNOSIS — I509 Heart failure, unspecified: Secondary | ICD-10-CM | POA: Diagnosis not present

## 2020-05-01 DIAGNOSIS — I10 Essential (primary) hypertension: Secondary | ICD-10-CM | POA: Diagnosis not present

## 2020-05-01 DIAGNOSIS — I509 Heart failure, unspecified: Secondary | ICD-10-CM | POA: Diagnosis not present

## 2020-05-01 DIAGNOSIS — R54 Age-related physical debility: Secondary | ICD-10-CM | POA: Diagnosis not present

## 2020-05-01 DIAGNOSIS — C61 Malignant neoplasm of prostate: Secondary | ICD-10-CM | POA: Diagnosis not present

## 2020-05-05 DIAGNOSIS — I10 Essential (primary) hypertension: Secondary | ICD-10-CM | POA: Diagnosis not present

## 2020-05-05 DIAGNOSIS — C61 Malignant neoplasm of prostate: Secondary | ICD-10-CM | POA: Diagnosis not present

## 2020-05-05 DIAGNOSIS — I509 Heart failure, unspecified: Secondary | ICD-10-CM | POA: Diagnosis not present

## 2020-05-05 DIAGNOSIS — R54 Age-related physical debility: Secondary | ICD-10-CM | POA: Diagnosis not present

## 2020-05-06 DIAGNOSIS — I509 Heart failure, unspecified: Secondary | ICD-10-CM | POA: Diagnosis not present

## 2020-05-06 DIAGNOSIS — I10 Essential (primary) hypertension: Secondary | ICD-10-CM | POA: Diagnosis not present

## 2020-05-06 DIAGNOSIS — C61 Malignant neoplasm of prostate: Secondary | ICD-10-CM | POA: Diagnosis not present

## 2020-05-06 DIAGNOSIS — R54 Age-related physical debility: Secondary | ICD-10-CM | POA: Diagnosis not present

## 2020-05-07 DIAGNOSIS — R54 Age-related physical debility: Secondary | ICD-10-CM | POA: Diagnosis not present

## 2020-05-07 DIAGNOSIS — C61 Malignant neoplasm of prostate: Secondary | ICD-10-CM | POA: Diagnosis not present

## 2020-05-07 DIAGNOSIS — I10 Essential (primary) hypertension: Secondary | ICD-10-CM | POA: Diagnosis not present

## 2020-05-07 DIAGNOSIS — I509 Heart failure, unspecified: Secondary | ICD-10-CM | POA: Diagnosis not present

## 2020-05-08 DIAGNOSIS — I10 Essential (primary) hypertension: Secondary | ICD-10-CM | POA: Diagnosis not present

## 2020-05-08 DIAGNOSIS — R54 Age-related physical debility: Secondary | ICD-10-CM | POA: Diagnosis not present

## 2020-05-08 DIAGNOSIS — I509 Heart failure, unspecified: Secondary | ICD-10-CM | POA: Diagnosis not present

## 2020-05-08 DIAGNOSIS — C61 Malignant neoplasm of prostate: Secondary | ICD-10-CM | POA: Diagnosis not present

## 2020-05-09 DIAGNOSIS — R54 Age-related physical debility: Secondary | ICD-10-CM | POA: Diagnosis not present

## 2020-05-09 DIAGNOSIS — I10 Essential (primary) hypertension: Secondary | ICD-10-CM | POA: Diagnosis not present

## 2020-05-09 DIAGNOSIS — C61 Malignant neoplasm of prostate: Secondary | ICD-10-CM | POA: Diagnosis not present

## 2020-05-09 DIAGNOSIS — I509 Heart failure, unspecified: Secondary | ICD-10-CM | POA: Diagnosis not present

## 2020-05-10 DIAGNOSIS — C61 Malignant neoplasm of prostate: Secondary | ICD-10-CM | POA: Diagnosis not present

## 2020-05-10 DIAGNOSIS — I10 Essential (primary) hypertension: Secondary | ICD-10-CM | POA: Diagnosis not present

## 2020-05-10 DIAGNOSIS — R54 Age-related physical debility: Secondary | ICD-10-CM | POA: Diagnosis not present

## 2020-05-10 DIAGNOSIS — I509 Heart failure, unspecified: Secondary | ICD-10-CM | POA: Diagnosis not present

## 2020-05-11 DIAGNOSIS — A4151 Sepsis due to Escherichia coli [E. coli]: Secondary | ICD-10-CM | POA: Diagnosis not present

## 2020-05-11 DIAGNOSIS — R54 Age-related physical debility: Secondary | ICD-10-CM | POA: Diagnosis not present

## 2020-05-11 DIAGNOSIS — C61 Malignant neoplasm of prostate: Secondary | ICD-10-CM | POA: Diagnosis not present

## 2020-05-11 DIAGNOSIS — I509 Heart failure, unspecified: Secondary | ICD-10-CM | POA: Diagnosis not present

## 2020-05-11 DIAGNOSIS — R300 Dysuria: Secondary | ICD-10-CM | POA: Diagnosis not present

## 2020-05-11 DIAGNOSIS — I10 Essential (primary) hypertension: Secondary | ICD-10-CM | POA: Diagnosis not present

## 2020-05-11 DIAGNOSIS — B961 Klebsiella pneumoniae [K. pneumoniae] as the cause of diseases classified elsewhere: Secondary | ICD-10-CM | POA: Diagnosis not present

## 2020-05-12 DIAGNOSIS — R54 Age-related physical debility: Secondary | ICD-10-CM | POA: Diagnosis not present

## 2020-05-12 DIAGNOSIS — I509 Heart failure, unspecified: Secondary | ICD-10-CM | POA: Diagnosis not present

## 2020-05-12 DIAGNOSIS — I10 Essential (primary) hypertension: Secondary | ICD-10-CM | POA: Diagnosis not present

## 2020-05-12 DIAGNOSIS — C61 Malignant neoplasm of prostate: Secondary | ICD-10-CM | POA: Diagnosis not present

## 2020-05-14 DIAGNOSIS — R54 Age-related physical debility: Secondary | ICD-10-CM | POA: Diagnosis not present

## 2020-05-14 DIAGNOSIS — C61 Malignant neoplasm of prostate: Secondary | ICD-10-CM | POA: Diagnosis not present

## 2020-05-14 DIAGNOSIS — I10 Essential (primary) hypertension: Secondary | ICD-10-CM | POA: Diagnosis not present

## 2020-05-14 DIAGNOSIS — I509 Heart failure, unspecified: Secondary | ICD-10-CM | POA: Diagnosis not present

## 2020-05-15 DIAGNOSIS — R54 Age-related physical debility: Secondary | ICD-10-CM | POA: Diagnosis not present

## 2020-05-15 DIAGNOSIS — I10 Essential (primary) hypertension: Secondary | ICD-10-CM | POA: Diagnosis not present

## 2020-05-15 DIAGNOSIS — C61 Malignant neoplasm of prostate: Secondary | ICD-10-CM | POA: Diagnosis not present

## 2020-05-15 DIAGNOSIS — I509 Heart failure, unspecified: Secondary | ICD-10-CM | POA: Diagnosis not present

## 2020-05-19 DIAGNOSIS — R54 Age-related physical debility: Secondary | ICD-10-CM | POA: Diagnosis not present

## 2020-05-19 DIAGNOSIS — I10 Essential (primary) hypertension: Secondary | ICD-10-CM | POA: Diagnosis not present

## 2020-05-19 DIAGNOSIS — I509 Heart failure, unspecified: Secondary | ICD-10-CM | POA: Diagnosis not present

## 2020-05-19 DIAGNOSIS — C61 Malignant neoplasm of prostate: Secondary | ICD-10-CM | POA: Diagnosis not present

## 2020-05-21 DIAGNOSIS — R54 Age-related physical debility: Secondary | ICD-10-CM | POA: Diagnosis not present

## 2020-05-21 DIAGNOSIS — I509 Heart failure, unspecified: Secondary | ICD-10-CM | POA: Diagnosis not present

## 2020-05-21 DIAGNOSIS — I10 Essential (primary) hypertension: Secondary | ICD-10-CM | POA: Diagnosis not present

## 2020-05-21 DIAGNOSIS — C61 Malignant neoplasm of prostate: Secondary | ICD-10-CM | POA: Diagnosis not present

## 2020-05-22 DIAGNOSIS — C61 Malignant neoplasm of prostate: Secondary | ICD-10-CM | POA: Diagnosis not present

## 2020-05-22 DIAGNOSIS — I10 Essential (primary) hypertension: Secondary | ICD-10-CM | POA: Diagnosis not present

## 2020-05-22 DIAGNOSIS — R54 Age-related physical debility: Secondary | ICD-10-CM | POA: Diagnosis not present

## 2020-05-22 DIAGNOSIS — I509 Heart failure, unspecified: Secondary | ICD-10-CM | POA: Diagnosis not present

## 2020-05-26 DIAGNOSIS — C61 Malignant neoplasm of prostate: Secondary | ICD-10-CM | POA: Diagnosis not present

## 2020-05-26 DIAGNOSIS — I509 Heart failure, unspecified: Secondary | ICD-10-CM | POA: Diagnosis not present

## 2020-05-26 DIAGNOSIS — I10 Essential (primary) hypertension: Secondary | ICD-10-CM | POA: Diagnosis not present

## 2020-05-26 DIAGNOSIS — R54 Age-related physical debility: Secondary | ICD-10-CM | POA: Diagnosis not present

## 2020-05-27 DIAGNOSIS — S90221A Contusion of right lesser toe(s) with damage to nail, initial encounter: Secondary | ICD-10-CM | POA: Diagnosis not present

## 2020-05-27 DIAGNOSIS — S90414A Abrasion, right lesser toe(s), initial encounter: Secondary | ICD-10-CM | POA: Diagnosis not present

## 2020-05-27 DIAGNOSIS — S91104A Unspecified open wound of right lesser toe(s) without damage to nail, initial encounter: Secondary | ICD-10-CM | POA: Diagnosis not present

## 2020-05-27 DIAGNOSIS — L600 Ingrowing nail: Secondary | ICD-10-CM | POA: Diagnosis not present

## 2020-05-28 DIAGNOSIS — I10 Essential (primary) hypertension: Secondary | ICD-10-CM | POA: Diagnosis not present

## 2020-05-28 DIAGNOSIS — I509 Heart failure, unspecified: Secondary | ICD-10-CM | POA: Diagnosis not present

## 2020-05-28 DIAGNOSIS — C61 Malignant neoplasm of prostate: Secondary | ICD-10-CM | POA: Diagnosis not present

## 2020-05-28 DIAGNOSIS — R54 Age-related physical debility: Secondary | ICD-10-CM | POA: Diagnosis not present

## 2020-05-29 DIAGNOSIS — R54 Age-related physical debility: Secondary | ICD-10-CM | POA: Diagnosis not present

## 2020-05-29 DIAGNOSIS — I509 Heart failure, unspecified: Secondary | ICD-10-CM | POA: Diagnosis not present

## 2020-05-29 DIAGNOSIS — I10 Essential (primary) hypertension: Secondary | ICD-10-CM | POA: Diagnosis not present

## 2020-05-29 DIAGNOSIS — C61 Malignant neoplasm of prostate: Secondary | ICD-10-CM | POA: Diagnosis not present

## 2020-06-02 DIAGNOSIS — I10 Essential (primary) hypertension: Secondary | ICD-10-CM | POA: Diagnosis not present

## 2020-06-02 DIAGNOSIS — I509 Heart failure, unspecified: Secondary | ICD-10-CM | POA: Diagnosis not present

## 2020-06-02 DIAGNOSIS — C61 Malignant neoplasm of prostate: Secondary | ICD-10-CM | POA: Diagnosis not present

## 2020-06-02 DIAGNOSIS — R54 Age-related physical debility: Secondary | ICD-10-CM | POA: Diagnosis not present

## 2020-06-03 DIAGNOSIS — C61 Malignant neoplasm of prostate: Secondary | ICD-10-CM | POA: Diagnosis not present

## 2020-06-03 DIAGNOSIS — I509 Heart failure, unspecified: Secondary | ICD-10-CM | POA: Diagnosis not present

## 2020-06-03 DIAGNOSIS — R54 Age-related physical debility: Secondary | ICD-10-CM | POA: Diagnosis not present

## 2020-06-03 DIAGNOSIS — I10 Essential (primary) hypertension: Secondary | ICD-10-CM | POA: Diagnosis not present

## 2020-06-04 DIAGNOSIS — C61 Malignant neoplasm of prostate: Secondary | ICD-10-CM | POA: Diagnosis not present

## 2020-06-04 DIAGNOSIS — R54 Age-related physical debility: Secondary | ICD-10-CM | POA: Diagnosis not present

## 2020-06-04 DIAGNOSIS — I509 Heart failure, unspecified: Secondary | ICD-10-CM | POA: Diagnosis not present

## 2020-06-04 DIAGNOSIS — C44622 Squamous cell carcinoma of skin of right upper limb, including shoulder: Secondary | ICD-10-CM | POA: Diagnosis not present

## 2020-06-04 DIAGNOSIS — I10 Essential (primary) hypertension: Secondary | ICD-10-CM | POA: Diagnosis not present

## 2020-06-04 DIAGNOSIS — S50811A Abrasion of right forearm, initial encounter: Secondary | ICD-10-CM | POA: Diagnosis not present

## 2020-06-05 DIAGNOSIS — R54 Age-related physical debility: Secondary | ICD-10-CM | POA: Diagnosis not present

## 2020-06-05 DIAGNOSIS — I509 Heart failure, unspecified: Secondary | ICD-10-CM | POA: Diagnosis not present

## 2020-06-05 DIAGNOSIS — I10 Essential (primary) hypertension: Secondary | ICD-10-CM | POA: Diagnosis not present

## 2020-06-05 DIAGNOSIS — C61 Malignant neoplasm of prostate: Secondary | ICD-10-CM | POA: Diagnosis not present

## 2020-06-09 DIAGNOSIS — I10 Essential (primary) hypertension: Secondary | ICD-10-CM | POA: Diagnosis not present

## 2020-06-09 DIAGNOSIS — I509 Heart failure, unspecified: Secondary | ICD-10-CM | POA: Diagnosis not present

## 2020-06-09 DIAGNOSIS — R54 Age-related physical debility: Secondary | ICD-10-CM | POA: Diagnosis not present

## 2020-06-09 DIAGNOSIS — C61 Malignant neoplasm of prostate: Secondary | ICD-10-CM | POA: Diagnosis not present

## 2020-06-11 DIAGNOSIS — C61 Malignant neoplasm of prostate: Secondary | ICD-10-CM | POA: Diagnosis not present

## 2020-06-11 DIAGNOSIS — I10 Essential (primary) hypertension: Secondary | ICD-10-CM | POA: Diagnosis not present

## 2020-06-11 DIAGNOSIS — I509 Heart failure, unspecified: Secondary | ICD-10-CM | POA: Diagnosis not present

## 2020-06-11 DIAGNOSIS — R54 Age-related physical debility: Secondary | ICD-10-CM | POA: Diagnosis not present

## 2020-06-12 DIAGNOSIS — I10 Essential (primary) hypertension: Secondary | ICD-10-CM | POA: Diagnosis not present

## 2020-06-12 DIAGNOSIS — I509 Heart failure, unspecified: Secondary | ICD-10-CM | POA: Diagnosis not present

## 2020-06-12 DIAGNOSIS — C61 Malignant neoplasm of prostate: Secondary | ICD-10-CM | POA: Diagnosis not present

## 2020-06-12 DIAGNOSIS — R54 Age-related physical debility: Secondary | ICD-10-CM | POA: Diagnosis not present

## 2020-06-15 DIAGNOSIS — C61 Malignant neoplasm of prostate: Secondary | ICD-10-CM | POA: Diagnosis not present

## 2020-06-15 DIAGNOSIS — I10 Essential (primary) hypertension: Secondary | ICD-10-CM | POA: Diagnosis not present

## 2020-06-15 DIAGNOSIS — I509 Heart failure, unspecified: Secondary | ICD-10-CM | POA: Diagnosis not present

## 2020-06-15 DIAGNOSIS — R54 Age-related physical debility: Secondary | ICD-10-CM | POA: Diagnosis not present

## 2020-06-16 DIAGNOSIS — I509 Heart failure, unspecified: Secondary | ICD-10-CM | POA: Diagnosis not present

## 2020-06-16 DIAGNOSIS — R54 Age-related physical debility: Secondary | ICD-10-CM | POA: Diagnosis not present

## 2020-06-16 DIAGNOSIS — C61 Malignant neoplasm of prostate: Secondary | ICD-10-CM | POA: Diagnosis not present

## 2020-06-16 DIAGNOSIS — I10 Essential (primary) hypertension: Secondary | ICD-10-CM | POA: Diagnosis not present

## 2020-06-18 DIAGNOSIS — C61 Malignant neoplasm of prostate: Secondary | ICD-10-CM | POA: Diagnosis not present

## 2020-06-18 DIAGNOSIS — I509 Heart failure, unspecified: Secondary | ICD-10-CM | POA: Diagnosis not present

## 2020-06-18 DIAGNOSIS — R54 Age-related physical debility: Secondary | ICD-10-CM | POA: Diagnosis not present

## 2020-06-18 DIAGNOSIS — I10 Essential (primary) hypertension: Secondary | ICD-10-CM | POA: Diagnosis not present

## 2020-06-19 DIAGNOSIS — R54 Age-related physical debility: Secondary | ICD-10-CM | POA: Diagnosis not present

## 2020-06-19 DIAGNOSIS — C61 Malignant neoplasm of prostate: Secondary | ICD-10-CM | POA: Diagnosis not present

## 2020-06-19 DIAGNOSIS — I509 Heart failure, unspecified: Secondary | ICD-10-CM | POA: Diagnosis not present

## 2020-06-19 DIAGNOSIS — I10 Essential (primary) hypertension: Secondary | ICD-10-CM | POA: Diagnosis not present

## 2020-06-23 DIAGNOSIS — I10 Essential (primary) hypertension: Secondary | ICD-10-CM | POA: Diagnosis not present

## 2020-06-23 DIAGNOSIS — R54 Age-related physical debility: Secondary | ICD-10-CM | POA: Diagnosis not present

## 2020-06-23 DIAGNOSIS — I509 Heart failure, unspecified: Secondary | ICD-10-CM | POA: Diagnosis not present

## 2020-06-23 DIAGNOSIS — C61 Malignant neoplasm of prostate: Secondary | ICD-10-CM | POA: Diagnosis not present

## 2020-06-24 DIAGNOSIS — C61 Malignant neoplasm of prostate: Secondary | ICD-10-CM | POA: Diagnosis not present

## 2020-06-24 DIAGNOSIS — R54 Age-related physical debility: Secondary | ICD-10-CM | POA: Diagnosis not present

## 2020-06-24 DIAGNOSIS — I10 Essential (primary) hypertension: Secondary | ICD-10-CM | POA: Diagnosis not present

## 2020-06-24 DIAGNOSIS — I509 Heart failure, unspecified: Secondary | ICD-10-CM | POA: Diagnosis not present

## 2020-06-25 DIAGNOSIS — I509 Heart failure, unspecified: Secondary | ICD-10-CM | POA: Diagnosis not present

## 2020-06-25 DIAGNOSIS — I10 Essential (primary) hypertension: Secondary | ICD-10-CM | POA: Diagnosis not present

## 2020-06-25 DIAGNOSIS — C61 Malignant neoplasm of prostate: Secondary | ICD-10-CM | POA: Diagnosis not present

## 2020-06-25 DIAGNOSIS — R54 Age-related physical debility: Secondary | ICD-10-CM | POA: Diagnosis not present

## 2020-06-26 DIAGNOSIS — C61 Malignant neoplasm of prostate: Secondary | ICD-10-CM | POA: Diagnosis not present

## 2020-06-26 DIAGNOSIS — I10 Essential (primary) hypertension: Secondary | ICD-10-CM | POA: Diagnosis not present

## 2020-06-26 DIAGNOSIS — R54 Age-related physical debility: Secondary | ICD-10-CM | POA: Diagnosis not present

## 2020-06-26 DIAGNOSIS — I509 Heart failure, unspecified: Secondary | ICD-10-CM | POA: Diagnosis not present

## 2020-06-30 DIAGNOSIS — I509 Heart failure, unspecified: Secondary | ICD-10-CM | POA: Diagnosis not present

## 2020-06-30 DIAGNOSIS — R54 Age-related physical debility: Secondary | ICD-10-CM | POA: Diagnosis not present

## 2020-06-30 DIAGNOSIS — H353231 Exudative age-related macular degeneration, bilateral, with active choroidal neovascularization: Secondary | ICD-10-CM | POA: Diagnosis not present

## 2020-06-30 DIAGNOSIS — I10 Essential (primary) hypertension: Secondary | ICD-10-CM | POA: Diagnosis not present

## 2020-06-30 DIAGNOSIS — C61 Malignant neoplasm of prostate: Secondary | ICD-10-CM | POA: Diagnosis not present

## 2020-07-02 DIAGNOSIS — C61 Malignant neoplasm of prostate: Secondary | ICD-10-CM | POA: Diagnosis not present

## 2020-07-02 DIAGNOSIS — I509 Heart failure, unspecified: Secondary | ICD-10-CM | POA: Diagnosis not present

## 2020-07-02 DIAGNOSIS — I10 Essential (primary) hypertension: Secondary | ICD-10-CM | POA: Diagnosis not present

## 2020-07-02 DIAGNOSIS — R54 Age-related physical debility: Secondary | ICD-10-CM | POA: Diagnosis not present

## 2020-07-03 DIAGNOSIS — R54 Age-related physical debility: Secondary | ICD-10-CM | POA: Diagnosis not present

## 2020-07-03 DIAGNOSIS — I10 Essential (primary) hypertension: Secondary | ICD-10-CM | POA: Diagnosis not present

## 2020-07-03 DIAGNOSIS — I509 Heart failure, unspecified: Secondary | ICD-10-CM | POA: Diagnosis not present

## 2020-07-03 DIAGNOSIS — C61 Malignant neoplasm of prostate: Secondary | ICD-10-CM | POA: Diagnosis not present

## 2020-07-07 DIAGNOSIS — I509 Heart failure, unspecified: Secondary | ICD-10-CM | POA: Diagnosis not present

## 2020-07-07 DIAGNOSIS — I10 Essential (primary) hypertension: Secondary | ICD-10-CM | POA: Diagnosis not present

## 2020-07-07 DIAGNOSIS — R54 Age-related physical debility: Secondary | ICD-10-CM | POA: Diagnosis not present

## 2020-07-07 DIAGNOSIS — C61 Malignant neoplasm of prostate: Secondary | ICD-10-CM | POA: Diagnosis not present

## 2020-07-09 DIAGNOSIS — I10 Essential (primary) hypertension: Secondary | ICD-10-CM | POA: Diagnosis not present

## 2020-07-09 DIAGNOSIS — C61 Malignant neoplasm of prostate: Secondary | ICD-10-CM | POA: Diagnosis not present

## 2020-07-09 DIAGNOSIS — I509 Heart failure, unspecified: Secondary | ICD-10-CM | POA: Diagnosis not present

## 2020-07-09 DIAGNOSIS — R54 Age-related physical debility: Secondary | ICD-10-CM | POA: Diagnosis not present

## 2020-07-10 DIAGNOSIS — C61 Malignant neoplasm of prostate: Secondary | ICD-10-CM | POA: Diagnosis not present

## 2020-07-10 DIAGNOSIS — I10 Essential (primary) hypertension: Secondary | ICD-10-CM | POA: Diagnosis not present

## 2020-07-10 DIAGNOSIS — R54 Age-related physical debility: Secondary | ICD-10-CM | POA: Diagnosis not present

## 2020-07-10 DIAGNOSIS — I509 Heart failure, unspecified: Secondary | ICD-10-CM | POA: Diagnosis not present

## 2020-07-14 DIAGNOSIS — R54 Age-related physical debility: Secondary | ICD-10-CM | POA: Diagnosis not present

## 2020-07-14 DIAGNOSIS — C61 Malignant neoplasm of prostate: Secondary | ICD-10-CM | POA: Diagnosis not present

## 2020-07-14 DIAGNOSIS — I10 Essential (primary) hypertension: Secondary | ICD-10-CM | POA: Diagnosis not present

## 2020-07-14 DIAGNOSIS — I509 Heart failure, unspecified: Secondary | ICD-10-CM | POA: Diagnosis not present

## 2020-07-16 DIAGNOSIS — C61 Malignant neoplasm of prostate: Secondary | ICD-10-CM | POA: Diagnosis not present

## 2020-07-16 DIAGNOSIS — R54 Age-related physical debility: Secondary | ICD-10-CM | POA: Diagnosis not present

## 2020-07-16 DIAGNOSIS — I509 Heart failure, unspecified: Secondary | ICD-10-CM | POA: Diagnosis not present

## 2020-07-16 DIAGNOSIS — I10 Essential (primary) hypertension: Secondary | ICD-10-CM | POA: Diagnosis not present

## 2020-07-17 DIAGNOSIS — R54 Age-related physical debility: Secondary | ICD-10-CM | POA: Diagnosis not present

## 2020-07-17 DIAGNOSIS — I10 Essential (primary) hypertension: Secondary | ICD-10-CM | POA: Diagnosis not present

## 2020-07-17 DIAGNOSIS — C61 Malignant neoplasm of prostate: Secondary | ICD-10-CM | POA: Diagnosis not present

## 2020-07-17 DIAGNOSIS — I509 Heart failure, unspecified: Secondary | ICD-10-CM | POA: Diagnosis not present

## 2020-07-18 DIAGNOSIS — R54 Age-related physical debility: Secondary | ICD-10-CM | POA: Diagnosis not present

## 2020-07-18 DIAGNOSIS — I509 Heart failure, unspecified: Secondary | ICD-10-CM | POA: Diagnosis not present

## 2020-07-18 DIAGNOSIS — C61 Malignant neoplasm of prostate: Secondary | ICD-10-CM | POA: Diagnosis not present

## 2020-07-18 DIAGNOSIS — I10 Essential (primary) hypertension: Secondary | ICD-10-CM | POA: Diagnosis not present

## 2020-07-21 DIAGNOSIS — I10 Essential (primary) hypertension: Secondary | ICD-10-CM | POA: Diagnosis not present

## 2020-07-21 DIAGNOSIS — R54 Age-related physical debility: Secondary | ICD-10-CM | POA: Diagnosis not present

## 2020-07-21 DIAGNOSIS — I509 Heart failure, unspecified: Secondary | ICD-10-CM | POA: Diagnosis not present

## 2020-07-21 DIAGNOSIS — C61 Malignant neoplasm of prostate: Secondary | ICD-10-CM | POA: Diagnosis not present

## 2020-07-23 DIAGNOSIS — L853 Xerosis cutis: Secondary | ICD-10-CM | POA: Diagnosis not present

## 2020-07-23 DIAGNOSIS — C61 Malignant neoplasm of prostate: Secondary | ICD-10-CM | POA: Diagnosis not present

## 2020-07-23 DIAGNOSIS — C44622 Squamous cell carcinoma of skin of right upper limb, including shoulder: Secondary | ICD-10-CM | POA: Diagnosis not present

## 2020-07-23 DIAGNOSIS — S80811A Abrasion, right lower leg, initial encounter: Secondary | ICD-10-CM | POA: Diagnosis not present

## 2020-07-23 DIAGNOSIS — I10 Essential (primary) hypertension: Secondary | ICD-10-CM | POA: Diagnosis not present

## 2020-07-23 DIAGNOSIS — L570 Actinic keratosis: Secondary | ICD-10-CM | POA: Diagnosis not present

## 2020-07-23 DIAGNOSIS — R54 Age-related physical debility: Secondary | ICD-10-CM | POA: Diagnosis not present

## 2020-07-23 DIAGNOSIS — F424 Excoriation (skin-picking) disorder: Secondary | ICD-10-CM | POA: Diagnosis not present

## 2020-07-23 DIAGNOSIS — S21001A Unspecified open wound of right breast, initial encounter: Secondary | ICD-10-CM | POA: Diagnosis not present

## 2020-07-23 DIAGNOSIS — I509 Heart failure, unspecified: Secondary | ICD-10-CM | POA: Diagnosis not present

## 2020-07-24 DIAGNOSIS — I10 Essential (primary) hypertension: Secondary | ICD-10-CM | POA: Diagnosis not present

## 2020-07-24 DIAGNOSIS — R54 Age-related physical debility: Secondary | ICD-10-CM | POA: Diagnosis not present

## 2020-07-24 DIAGNOSIS — C61 Malignant neoplasm of prostate: Secondary | ICD-10-CM | POA: Diagnosis not present

## 2020-07-24 DIAGNOSIS — I509 Heart failure, unspecified: Secondary | ICD-10-CM | POA: Diagnosis not present

## 2020-07-26 DIAGNOSIS — C61 Malignant neoplasm of prostate: Secondary | ICD-10-CM | POA: Diagnosis not present

## 2020-07-26 DIAGNOSIS — I10 Essential (primary) hypertension: Secondary | ICD-10-CM | POA: Diagnosis not present

## 2020-07-26 DIAGNOSIS — I509 Heart failure, unspecified: Secondary | ICD-10-CM | POA: Diagnosis not present

## 2020-07-26 DIAGNOSIS — R54 Age-related physical debility: Secondary | ICD-10-CM | POA: Diagnosis not present

## 2020-07-28 DIAGNOSIS — I10 Essential (primary) hypertension: Secondary | ICD-10-CM | POA: Diagnosis not present

## 2020-07-28 DIAGNOSIS — C61 Malignant neoplasm of prostate: Secondary | ICD-10-CM | POA: Diagnosis not present

## 2020-07-28 DIAGNOSIS — I509 Heart failure, unspecified: Secondary | ICD-10-CM | POA: Diagnosis not present

## 2020-07-28 DIAGNOSIS — R54 Age-related physical debility: Secondary | ICD-10-CM | POA: Diagnosis not present

## 2020-07-29 DIAGNOSIS — R54 Age-related physical debility: Secondary | ICD-10-CM | POA: Diagnosis not present

## 2020-07-29 DIAGNOSIS — C61 Malignant neoplasm of prostate: Secondary | ICD-10-CM | POA: Diagnosis not present

## 2020-07-29 DIAGNOSIS — I509 Heart failure, unspecified: Secondary | ICD-10-CM | POA: Diagnosis not present

## 2020-07-29 DIAGNOSIS — I10 Essential (primary) hypertension: Secondary | ICD-10-CM | POA: Diagnosis not present

## 2020-07-30 DIAGNOSIS — C61 Malignant neoplasm of prostate: Secondary | ICD-10-CM | POA: Diagnosis not present

## 2020-07-30 DIAGNOSIS — I509 Heart failure, unspecified: Secondary | ICD-10-CM | POA: Diagnosis not present

## 2020-07-30 DIAGNOSIS — I10 Essential (primary) hypertension: Secondary | ICD-10-CM | POA: Diagnosis not present

## 2020-07-30 DIAGNOSIS — R54 Age-related physical debility: Secondary | ICD-10-CM | POA: Diagnosis not present

## 2020-07-31 DIAGNOSIS — R54 Age-related physical debility: Secondary | ICD-10-CM | POA: Diagnosis not present

## 2020-07-31 DIAGNOSIS — I509 Heart failure, unspecified: Secondary | ICD-10-CM | POA: Diagnosis not present

## 2020-07-31 DIAGNOSIS — I10 Essential (primary) hypertension: Secondary | ICD-10-CM | POA: Diagnosis not present

## 2020-07-31 DIAGNOSIS — C61 Malignant neoplasm of prostate: Secondary | ICD-10-CM | POA: Diagnosis not present

## 2020-08-04 DIAGNOSIS — S21001A Unspecified open wound of right breast, initial encounter: Secondary | ICD-10-CM | POA: Diagnosis not present

## 2020-08-04 DIAGNOSIS — I509 Heart failure, unspecified: Secondary | ICD-10-CM | POA: Diagnosis not present

## 2020-08-04 DIAGNOSIS — C61 Malignant neoplasm of prostate: Secondary | ICD-10-CM | POA: Diagnosis not present

## 2020-08-04 DIAGNOSIS — L853 Xerosis cutis: Secondary | ICD-10-CM | POA: Diagnosis not present

## 2020-08-04 DIAGNOSIS — R54 Age-related physical debility: Secondary | ICD-10-CM | POA: Diagnosis not present

## 2020-08-04 DIAGNOSIS — I10 Essential (primary) hypertension: Secondary | ICD-10-CM | POA: Diagnosis not present

## 2020-08-06 DIAGNOSIS — I10 Essential (primary) hypertension: Secondary | ICD-10-CM | POA: Diagnosis not present

## 2020-08-06 DIAGNOSIS — C61 Malignant neoplasm of prostate: Secondary | ICD-10-CM | POA: Diagnosis not present

## 2020-08-06 DIAGNOSIS — I509 Heart failure, unspecified: Secondary | ICD-10-CM | POA: Diagnosis not present

## 2020-08-06 DIAGNOSIS — R54 Age-related physical debility: Secondary | ICD-10-CM | POA: Diagnosis not present

## 2020-08-07 DIAGNOSIS — R54 Age-related physical debility: Secondary | ICD-10-CM | POA: Diagnosis not present

## 2020-08-07 DIAGNOSIS — C61 Malignant neoplasm of prostate: Secondary | ICD-10-CM | POA: Diagnosis not present

## 2020-08-07 DIAGNOSIS — I509 Heart failure, unspecified: Secondary | ICD-10-CM | POA: Diagnosis not present

## 2020-08-07 DIAGNOSIS — I10 Essential (primary) hypertension: Secondary | ICD-10-CM | POA: Diagnosis not present

## 2020-08-10 DIAGNOSIS — I10 Essential (primary) hypertension: Secondary | ICD-10-CM | POA: Diagnosis not present

## 2020-08-10 DIAGNOSIS — R54 Age-related physical debility: Secondary | ICD-10-CM | POA: Diagnosis not present

## 2020-08-10 DIAGNOSIS — C61 Malignant neoplasm of prostate: Secondary | ICD-10-CM | POA: Diagnosis not present

## 2020-08-10 DIAGNOSIS — I509 Heart failure, unspecified: Secondary | ICD-10-CM | POA: Diagnosis not present

## 2020-08-11 DIAGNOSIS — C61 Malignant neoplasm of prostate: Secondary | ICD-10-CM | POA: Diagnosis not present

## 2020-08-11 DIAGNOSIS — I10 Essential (primary) hypertension: Secondary | ICD-10-CM | POA: Diagnosis not present

## 2020-08-11 DIAGNOSIS — R54 Age-related physical debility: Secondary | ICD-10-CM | POA: Diagnosis not present

## 2020-08-11 DIAGNOSIS — I509 Heart failure, unspecified: Secondary | ICD-10-CM | POA: Diagnosis not present

## 2020-08-12 DIAGNOSIS — R54 Age-related physical debility: Secondary | ICD-10-CM | POA: Diagnosis not present

## 2020-08-12 DIAGNOSIS — C61 Malignant neoplasm of prostate: Secondary | ICD-10-CM | POA: Diagnosis not present

## 2020-08-12 DIAGNOSIS — I509 Heart failure, unspecified: Secondary | ICD-10-CM | POA: Diagnosis not present

## 2020-08-12 DIAGNOSIS — I10 Essential (primary) hypertension: Secondary | ICD-10-CM | POA: Diagnosis not present

## 2020-08-13 DIAGNOSIS — R54 Age-related physical debility: Secondary | ICD-10-CM | POA: Diagnosis not present

## 2020-08-13 DIAGNOSIS — I10 Essential (primary) hypertension: Secondary | ICD-10-CM | POA: Diagnosis not present

## 2020-08-13 DIAGNOSIS — C61 Malignant neoplasm of prostate: Secondary | ICD-10-CM | POA: Diagnosis not present

## 2020-08-13 DIAGNOSIS — I509 Heart failure, unspecified: Secondary | ICD-10-CM | POA: Diagnosis not present

## 2020-08-14 DIAGNOSIS — I10 Essential (primary) hypertension: Secondary | ICD-10-CM | POA: Diagnosis not present

## 2020-08-14 DIAGNOSIS — C61 Malignant neoplasm of prostate: Secondary | ICD-10-CM | POA: Diagnosis not present

## 2020-08-14 DIAGNOSIS — I509 Heart failure, unspecified: Secondary | ICD-10-CM | POA: Diagnosis not present

## 2020-08-14 DIAGNOSIS — R54 Age-related physical debility: Secondary | ICD-10-CM | POA: Diagnosis not present

## 2020-08-18 DIAGNOSIS — C61 Malignant neoplasm of prostate: Secondary | ICD-10-CM | POA: Diagnosis not present

## 2020-08-18 DIAGNOSIS — I10 Essential (primary) hypertension: Secondary | ICD-10-CM | POA: Diagnosis not present

## 2020-08-18 DIAGNOSIS — R54 Age-related physical debility: Secondary | ICD-10-CM | POA: Diagnosis not present

## 2020-08-18 DIAGNOSIS — I509 Heart failure, unspecified: Secondary | ICD-10-CM | POA: Diagnosis not present

## 2020-08-20 DIAGNOSIS — I10 Essential (primary) hypertension: Secondary | ICD-10-CM | POA: Diagnosis not present

## 2020-08-20 DIAGNOSIS — C61 Malignant neoplasm of prostate: Secondary | ICD-10-CM | POA: Diagnosis not present

## 2020-08-20 DIAGNOSIS — I509 Heart failure, unspecified: Secondary | ICD-10-CM | POA: Diagnosis not present

## 2020-08-20 DIAGNOSIS — R54 Age-related physical debility: Secondary | ICD-10-CM | POA: Diagnosis not present

## 2020-08-21 DIAGNOSIS — C61 Malignant neoplasm of prostate: Secondary | ICD-10-CM | POA: Diagnosis not present

## 2020-08-21 DIAGNOSIS — I10 Essential (primary) hypertension: Secondary | ICD-10-CM | POA: Diagnosis not present

## 2020-08-21 DIAGNOSIS — I509 Heart failure, unspecified: Secondary | ICD-10-CM | POA: Diagnosis not present

## 2020-08-21 DIAGNOSIS — R54 Age-related physical debility: Secondary | ICD-10-CM | POA: Diagnosis not present

## 2020-08-25 DIAGNOSIS — R54 Age-related physical debility: Secondary | ICD-10-CM | POA: Diagnosis not present

## 2020-08-25 DIAGNOSIS — C61 Malignant neoplasm of prostate: Secondary | ICD-10-CM | POA: Diagnosis not present

## 2020-08-25 DIAGNOSIS — I10 Essential (primary) hypertension: Secondary | ICD-10-CM | POA: Diagnosis not present

## 2020-08-25 DIAGNOSIS — I509 Heart failure, unspecified: Secondary | ICD-10-CM | POA: Diagnosis not present

## 2020-08-26 DIAGNOSIS — I10 Essential (primary) hypertension: Secondary | ICD-10-CM | POA: Diagnosis not present

## 2020-08-26 DIAGNOSIS — R54 Age-related physical debility: Secondary | ICD-10-CM | POA: Diagnosis not present

## 2020-08-26 DIAGNOSIS — C61 Malignant neoplasm of prostate: Secondary | ICD-10-CM | POA: Diagnosis not present

## 2020-08-26 DIAGNOSIS — I509 Heart failure, unspecified: Secondary | ICD-10-CM | POA: Diagnosis not present

## 2020-08-27 DIAGNOSIS — R54 Age-related physical debility: Secondary | ICD-10-CM | POA: Diagnosis not present

## 2020-08-27 DIAGNOSIS — I10 Essential (primary) hypertension: Secondary | ICD-10-CM | POA: Diagnosis not present

## 2020-08-27 DIAGNOSIS — I509 Heart failure, unspecified: Secondary | ICD-10-CM | POA: Diagnosis not present

## 2020-08-27 DIAGNOSIS — C61 Malignant neoplasm of prostate: Secondary | ICD-10-CM | POA: Diagnosis not present

## 2020-08-28 DIAGNOSIS — R54 Age-related physical debility: Secondary | ICD-10-CM | POA: Diagnosis not present

## 2020-08-28 DIAGNOSIS — I509 Heart failure, unspecified: Secondary | ICD-10-CM | POA: Diagnosis not present

## 2020-08-28 DIAGNOSIS — C61 Malignant neoplasm of prostate: Secondary | ICD-10-CM | POA: Diagnosis not present

## 2020-08-28 DIAGNOSIS — I10 Essential (primary) hypertension: Secondary | ICD-10-CM | POA: Diagnosis not present

## 2020-09-01 DIAGNOSIS — C61 Malignant neoplasm of prostate: Secondary | ICD-10-CM | POA: Diagnosis not present

## 2020-09-01 DIAGNOSIS — R54 Age-related physical debility: Secondary | ICD-10-CM | POA: Diagnosis not present

## 2020-09-01 DIAGNOSIS — I509 Heart failure, unspecified: Secondary | ICD-10-CM | POA: Diagnosis not present

## 2020-09-01 DIAGNOSIS — I10 Essential (primary) hypertension: Secondary | ICD-10-CM | POA: Diagnosis not present

## 2020-09-03 DIAGNOSIS — R54 Age-related physical debility: Secondary | ICD-10-CM | POA: Diagnosis not present

## 2020-09-03 DIAGNOSIS — I509 Heart failure, unspecified: Secondary | ICD-10-CM | POA: Diagnosis not present

## 2020-09-03 DIAGNOSIS — I10 Essential (primary) hypertension: Secondary | ICD-10-CM | POA: Diagnosis not present

## 2020-09-03 DIAGNOSIS — C61 Malignant neoplasm of prostate: Secondary | ICD-10-CM | POA: Diagnosis not present

## 2020-09-04 DIAGNOSIS — R54 Age-related physical debility: Secondary | ICD-10-CM | POA: Diagnosis not present

## 2020-09-04 DIAGNOSIS — I10 Essential (primary) hypertension: Secondary | ICD-10-CM | POA: Diagnosis not present

## 2020-09-04 DIAGNOSIS — I509 Heart failure, unspecified: Secondary | ICD-10-CM | POA: Diagnosis not present

## 2020-09-04 DIAGNOSIS — C61 Malignant neoplasm of prostate: Secondary | ICD-10-CM | POA: Diagnosis not present

## 2020-09-11 DIAGNOSIS — I509 Heart failure, unspecified: Secondary | ICD-10-CM | POA: Diagnosis not present

## 2020-09-11 DIAGNOSIS — C61 Malignant neoplasm of prostate: Secondary | ICD-10-CM | POA: Diagnosis not present

## 2020-09-11 DIAGNOSIS — R54 Age-related physical debility: Secondary | ICD-10-CM | POA: Diagnosis not present

## 2020-09-11 DIAGNOSIS — I10 Essential (primary) hypertension: Secondary | ICD-10-CM | POA: Diagnosis not present

## 2020-09-12 DIAGNOSIS — I10 Essential (primary) hypertension: Secondary | ICD-10-CM | POA: Diagnosis not present

## 2020-09-12 DIAGNOSIS — R54 Age-related physical debility: Secondary | ICD-10-CM | POA: Diagnosis not present

## 2020-09-12 DIAGNOSIS — I509 Heart failure, unspecified: Secondary | ICD-10-CM | POA: Diagnosis not present

## 2020-09-12 DIAGNOSIS — C61 Malignant neoplasm of prostate: Secondary | ICD-10-CM | POA: Diagnosis not present

## 2020-09-13 DIAGNOSIS — I10 Essential (primary) hypertension: Secondary | ICD-10-CM | POA: Diagnosis not present

## 2020-09-13 DIAGNOSIS — R54 Age-related physical debility: Secondary | ICD-10-CM | POA: Diagnosis not present

## 2020-09-13 DIAGNOSIS — C61 Malignant neoplasm of prostate: Secondary | ICD-10-CM | POA: Diagnosis not present

## 2020-09-13 DIAGNOSIS — I509 Heart failure, unspecified: Secondary | ICD-10-CM | POA: Diagnosis not present

## 2020-09-14 DIAGNOSIS — C61 Malignant neoplasm of prostate: Secondary | ICD-10-CM | POA: Diagnosis not present

## 2020-09-14 DIAGNOSIS — I509 Heart failure, unspecified: Secondary | ICD-10-CM | POA: Diagnosis not present

## 2020-09-14 DIAGNOSIS — R54 Age-related physical debility: Secondary | ICD-10-CM | POA: Diagnosis not present

## 2020-09-14 DIAGNOSIS — I10 Essential (primary) hypertension: Secondary | ICD-10-CM | POA: Diagnosis not present

## 2020-09-15 DIAGNOSIS — I509 Heart failure, unspecified: Secondary | ICD-10-CM | POA: Diagnosis not present

## 2020-09-15 DIAGNOSIS — I10 Essential (primary) hypertension: Secondary | ICD-10-CM | POA: Diagnosis not present

## 2020-09-15 DIAGNOSIS — C61 Malignant neoplasm of prostate: Secondary | ICD-10-CM | POA: Diagnosis not present

## 2020-09-15 DIAGNOSIS — R54 Age-related physical debility: Secondary | ICD-10-CM | POA: Diagnosis not present

## 2020-09-16 DIAGNOSIS — R54 Age-related physical debility: Secondary | ICD-10-CM | POA: Diagnosis not present

## 2020-09-16 DIAGNOSIS — C61 Malignant neoplasm of prostate: Secondary | ICD-10-CM | POA: Diagnosis not present

## 2020-09-16 DIAGNOSIS — I10 Essential (primary) hypertension: Secondary | ICD-10-CM | POA: Diagnosis not present

## 2020-09-16 DIAGNOSIS — I509 Heart failure, unspecified: Secondary | ICD-10-CM | POA: Diagnosis not present

## 2020-09-17 DIAGNOSIS — R54 Age-related physical debility: Secondary | ICD-10-CM | POA: Diagnosis not present

## 2020-09-17 DIAGNOSIS — I509 Heart failure, unspecified: Secondary | ICD-10-CM | POA: Diagnosis not present

## 2020-09-17 DIAGNOSIS — I10 Essential (primary) hypertension: Secondary | ICD-10-CM | POA: Diagnosis not present

## 2020-09-17 DIAGNOSIS — C61 Malignant neoplasm of prostate: Secondary | ICD-10-CM | POA: Diagnosis not present

## 2020-09-18 DIAGNOSIS — C61 Malignant neoplasm of prostate: Secondary | ICD-10-CM | POA: Diagnosis not present

## 2020-09-18 DIAGNOSIS — I509 Heart failure, unspecified: Secondary | ICD-10-CM | POA: Diagnosis not present

## 2020-09-18 DIAGNOSIS — I10 Essential (primary) hypertension: Secondary | ICD-10-CM | POA: Diagnosis not present

## 2020-09-18 DIAGNOSIS — R54 Age-related physical debility: Secondary | ICD-10-CM | POA: Diagnosis not present

## 2020-09-19 DIAGNOSIS — I509 Heart failure, unspecified: Secondary | ICD-10-CM | POA: Diagnosis not present

## 2020-09-19 DIAGNOSIS — I10 Essential (primary) hypertension: Secondary | ICD-10-CM | POA: Diagnosis not present

## 2020-09-19 DIAGNOSIS — R54 Age-related physical debility: Secondary | ICD-10-CM | POA: Diagnosis not present

## 2020-09-19 DIAGNOSIS — C61 Malignant neoplasm of prostate: Secondary | ICD-10-CM | POA: Diagnosis not present

## 2020-09-20 DIAGNOSIS — I10 Essential (primary) hypertension: Secondary | ICD-10-CM | POA: Diagnosis not present

## 2020-09-20 DIAGNOSIS — C61 Malignant neoplasm of prostate: Secondary | ICD-10-CM | POA: Diagnosis not present

## 2020-09-20 DIAGNOSIS — I509 Heart failure, unspecified: Secondary | ICD-10-CM | POA: Diagnosis not present

## 2020-09-20 DIAGNOSIS — R54 Age-related physical debility: Secondary | ICD-10-CM | POA: Diagnosis not present

## 2020-09-21 DIAGNOSIS — I10 Essential (primary) hypertension: Secondary | ICD-10-CM | POA: Diagnosis not present

## 2020-09-21 DIAGNOSIS — C61 Malignant neoplasm of prostate: Secondary | ICD-10-CM | POA: Diagnosis not present

## 2020-09-21 DIAGNOSIS — I509 Heart failure, unspecified: Secondary | ICD-10-CM | POA: Diagnosis not present

## 2020-09-21 DIAGNOSIS — R54 Age-related physical debility: Secondary | ICD-10-CM | POA: Diagnosis not present

## 2020-09-25 DEATH — deceased
